# Patient Record
Sex: Female | Born: 1945 | Race: White | Hispanic: No | Smoking: Former smoker
Health system: Southern US, Community
[De-identification: ages and names within clinical notes are randomized; demographics above are authoritative.]

## PROBLEM LIST (undated history)

## (undated) DIAGNOSIS — Z720 Tobacco use: Secondary | ICD-10-CM

## (undated) DIAGNOSIS — R32 Unspecified urinary incontinence: Secondary | ICD-10-CM

## (undated) DIAGNOSIS — E119 Type 2 diabetes mellitus without complications: Secondary | ICD-10-CM

## (undated) DIAGNOSIS — I10 Essential (primary) hypertension: Secondary | ICD-10-CM

## (undated) DIAGNOSIS — M1990 Unspecified osteoarthritis, unspecified site: Secondary | ICD-10-CM

## (undated) DIAGNOSIS — Z9989 Dependence on other enabling machines and devices: Secondary | ICD-10-CM

## (undated) DIAGNOSIS — Z9289 Personal history of other medical treatment: Secondary | ICD-10-CM

## (undated) DIAGNOSIS — K219 Gastro-esophageal reflux disease without esophagitis: Secondary | ICD-10-CM

## (undated) DIAGNOSIS — D649 Anemia, unspecified: Secondary | ICD-10-CM

## (undated) DIAGNOSIS — J309 Allergic rhinitis, unspecified: Secondary | ICD-10-CM

## (undated) DIAGNOSIS — G4733 Obstructive sleep apnea (adult) (pediatric): Secondary | ICD-10-CM

## (undated) DIAGNOSIS — R0989 Other specified symptoms and signs involving the circulatory and respiratory systems: Secondary | ICD-10-CM

## (undated) DIAGNOSIS — F32A Depression, unspecified: Secondary | ICD-10-CM

## (undated) DIAGNOSIS — I4891 Unspecified atrial fibrillation: Secondary | ICD-10-CM

## (undated) DIAGNOSIS — J449 Chronic obstructive pulmonary disease, unspecified: Secondary | ICD-10-CM

## (undated) DIAGNOSIS — D509 Iron deficiency anemia, unspecified: Secondary | ICD-10-CM

## (undated) DIAGNOSIS — R0600 Dyspnea, unspecified: Secondary | ICD-10-CM

## (undated) DIAGNOSIS — C801 Malignant (primary) neoplasm, unspecified: Secondary | ICD-10-CM

## (undated) DIAGNOSIS — E785 Hyperlipidemia, unspecified: Secondary | ICD-10-CM

## (undated) DIAGNOSIS — E7800 Pure hypercholesterolemia, unspecified: Secondary | ICD-10-CM

## (undated) DIAGNOSIS — J349 Unspecified disorder of nose and nasal sinuses: Secondary | ICD-10-CM

## (undated) DIAGNOSIS — L409 Psoriasis, unspecified: Secondary | ICD-10-CM

## (undated) DIAGNOSIS — R0789 Other chest pain: Secondary | ICD-10-CM

## (undated) DIAGNOSIS — R0602 Shortness of breath: Secondary | ICD-10-CM

## (undated) DIAGNOSIS — I2510 Atherosclerotic heart disease of native coronary artery without angina pectoris: Secondary | ICD-10-CM

## (undated) HISTORY — DX: Tobacco use: Z72.0

## (undated) HISTORY — PX: OTHER SURGICAL HISTORY: SHX169

## (undated) HISTORY — DX: Gastro-esophageal reflux disease without esophagitis: K21.9

## (undated) HISTORY — DX: Unspecified disorder of nose and nasal sinuses: J34.9

## (undated) HISTORY — PX: ATRIAL FLUTTER ABLATION: SHX5733

## (undated) HISTORY — DX: Pure hypercholesterolemia, unspecified: E78.00

## (undated) HISTORY — DX: Morbid (severe) obesity due to excess calories: E66.01

## (undated) HISTORY — PX: LARYNX SURGERY: SHX692

## (undated) HISTORY — DX: Unspecified osteoarthritis, unspecified site: M19.90

## (undated) HISTORY — DX: Type 2 diabetes mellitus without complications: E11.9

## (undated) HISTORY — DX: Essential (primary) hypertension: I10

## (undated) HISTORY — DX: Psoriasis, unspecified: L40.9

## (undated) HISTORY — DX: Other chest pain: R07.89

## (undated) HISTORY — DX: Obstructive sleep apnea (adult) (pediatric): G47.33

## (undated) HISTORY — DX: Iron deficiency anemia, unspecified: D50.9

## (undated) HISTORY — DX: Hyperlipidemia, unspecified: E78.5

## (undated) HISTORY — DX: Shortness of breath: R06.02

## (undated) HISTORY — DX: Dependence on other enabling machines and devices: Z99.89

## (undated) HISTORY — DX: Unspecified atrial fibrillation: I48.91

## (undated) HISTORY — DX: Other specified symptoms and signs involving the circulatory and respiratory systems: R09.89

## (undated) HISTORY — DX: Allergic rhinitis, unspecified: J30.9

## (undated) HISTORY — DX: Unspecified urinary incontinence: R32

## (undated) HISTORY — DX: Atherosclerotic heart disease of native coronary artery without angina pectoris: I25.10

---

## 1998-10-22 ENCOUNTER — Ambulatory Visit: Admission: RE | Admit: 1998-10-22 | Discharge: 1998-10-22 | Payer: Self-pay | Admitting: Family Medicine

## 1998-12-27 ENCOUNTER — Encounter: Payer: Self-pay | Admitting: Family Medicine

## 1998-12-27 ENCOUNTER — Ambulatory Visit (HOSPITAL_COMMUNITY): Admission: RE | Admit: 1998-12-27 | Discharge: 1998-12-27 | Payer: Self-pay | Admitting: Family Medicine

## 1999-11-17 DIAGNOSIS — R809 Proteinuria, unspecified: Secondary | ICD-10-CM

## 1999-11-17 HISTORY — DX: Proteinuria, unspecified: R80.9

## 2000-02-18 ENCOUNTER — Other Ambulatory Visit: Admission: RE | Admit: 2000-02-18 | Discharge: 2000-02-18 | Payer: Self-pay | Admitting: Obstetrics and Gynecology

## 2000-03-03 ENCOUNTER — Ambulatory Visit (HOSPITAL_COMMUNITY): Admission: RE | Admit: 2000-03-03 | Discharge: 2000-03-03 | Payer: Self-pay | Admitting: Obstetrics and Gynecology

## 2000-03-03 ENCOUNTER — Encounter: Payer: Self-pay | Admitting: Obstetrics and Gynecology

## 2000-07-16 ENCOUNTER — Ambulatory Visit (HOSPITAL_BASED_OUTPATIENT_CLINIC_OR_DEPARTMENT_OTHER): Admission: RE | Admit: 2000-07-16 | Discharge: 2000-07-16 | Payer: Self-pay | Admitting: *Deleted

## 2001-08-30 ENCOUNTER — Ambulatory Visit (HOSPITAL_COMMUNITY): Admission: RE | Admit: 2001-08-30 | Discharge: 2001-08-30 | Payer: Self-pay | Admitting: Obstetrics and Gynecology

## 2001-08-30 ENCOUNTER — Encounter: Payer: Self-pay | Admitting: Obstetrics and Gynecology

## 2001-09-13 ENCOUNTER — Other Ambulatory Visit: Admission: RE | Admit: 2001-09-13 | Discharge: 2001-09-13 | Payer: Self-pay | Admitting: Obstetrics and Gynecology

## 2002-02-22 ENCOUNTER — Encounter: Admission: RE | Admit: 2002-02-22 | Discharge: 2002-02-22 | Payer: Self-pay | Admitting: Internal Medicine

## 2002-02-22 ENCOUNTER — Encounter (HOSPITAL_BASED_OUTPATIENT_CLINIC_OR_DEPARTMENT_OTHER): Payer: Self-pay | Admitting: Internal Medicine

## 2002-09-04 ENCOUNTER — Ambulatory Visit (HOSPITAL_COMMUNITY): Admission: RE | Admit: 2002-09-04 | Discharge: 2002-09-04 | Payer: Self-pay | Admitting: Gastroenterology

## 2003-08-06 ENCOUNTER — Other Ambulatory Visit: Admission: RE | Admit: 2003-08-06 | Discharge: 2003-08-06 | Payer: Self-pay | Admitting: Obstetrics and Gynecology

## 2003-08-07 ENCOUNTER — Encounter: Payer: Self-pay | Admitting: Obstetrics and Gynecology

## 2003-08-07 ENCOUNTER — Ambulatory Visit (HOSPITAL_COMMUNITY): Admission: RE | Admit: 2003-08-07 | Discharge: 2003-08-07 | Payer: Self-pay | Admitting: Obstetrics and Gynecology

## 2003-11-17 HISTORY — PX: TOTAL KNEE ARTHROPLASTY: SHX125

## 2003-11-20 ENCOUNTER — Inpatient Hospital Stay (HOSPITAL_COMMUNITY): Admission: RE | Admit: 2003-11-20 | Discharge: 2003-11-23 | Payer: Self-pay | Admitting: Orthopedic Surgery

## 2003-11-23 ENCOUNTER — Inpatient Hospital Stay (HOSPITAL_COMMUNITY)
Admission: RE | Admit: 2003-11-23 | Discharge: 2003-11-29 | Payer: Self-pay | Admitting: Physical Medicine & Rehabilitation

## 2004-10-31 ENCOUNTER — Other Ambulatory Visit: Admission: RE | Admit: 2004-10-31 | Discharge: 2004-10-31 | Payer: Self-pay | Admitting: Obstetrics and Gynecology

## 2005-07-22 ENCOUNTER — Ambulatory Visit (HOSPITAL_COMMUNITY): Admission: RE | Admit: 2005-07-22 | Discharge: 2005-07-22 | Payer: Self-pay | Admitting: Obstetrics and Gynecology

## 2005-11-16 HISTORY — PX: TOTAL KNEE ARTHROPLASTY: SHX125

## 2005-11-16 HISTORY — PX: OTHER SURGICAL HISTORY: SHX169

## 2006-09-09 ENCOUNTER — Other Ambulatory Visit: Admission: RE | Admit: 2006-09-09 | Discharge: 2006-09-09 | Payer: Self-pay | Admitting: Obstetrics & Gynecology

## 2006-09-09 ENCOUNTER — Ambulatory Visit (HOSPITAL_COMMUNITY): Admission: RE | Admit: 2006-09-09 | Discharge: 2006-09-09 | Payer: Self-pay | Admitting: Obstetrics and Gynecology

## 2007-02-21 ENCOUNTER — Inpatient Hospital Stay (HOSPITAL_COMMUNITY): Admission: RE | Admit: 2007-02-21 | Discharge: 2007-02-25 | Payer: Self-pay | Admitting: Orthopedic Surgery

## 2007-09-22 ENCOUNTER — Ambulatory Visit (HOSPITAL_COMMUNITY): Admission: RE | Admit: 2007-09-22 | Discharge: 2007-09-22 | Payer: Self-pay | Admitting: Obstetrics & Gynecology

## 2008-01-26 ENCOUNTER — Other Ambulatory Visit: Admission: RE | Admit: 2008-01-26 | Discharge: 2008-01-26 | Payer: Self-pay | Admitting: Obstetrics & Gynecology

## 2008-09-27 ENCOUNTER — Ambulatory Visit (HOSPITAL_COMMUNITY): Admission: RE | Admit: 2008-09-27 | Discharge: 2008-09-27 | Payer: Self-pay | Admitting: Obstetrics & Gynecology

## 2009-03-05 ENCOUNTER — Other Ambulatory Visit: Admission: RE | Admit: 2009-03-05 | Discharge: 2009-03-05 | Payer: Self-pay | Admitting: Obstetrics & Gynecology

## 2009-11-16 HISTORY — PX: OTHER SURGICAL HISTORY: SHX169

## 2011-02-26 ENCOUNTER — Other Ambulatory Visit (HOSPITAL_COMMUNITY): Payer: Self-pay | Admitting: Surgery

## 2011-02-26 DIAGNOSIS — Z01818 Encounter for other preprocedural examination: Secondary | ICD-10-CM

## 2011-03-02 ENCOUNTER — Inpatient Hospital Stay (HOSPITAL_COMMUNITY): Admission: RE | Admit: 2011-03-02 | Payer: Self-pay | Source: Ambulatory Visit

## 2011-03-04 ENCOUNTER — Encounter: Payer: 59 | Attending: Surgery | Admitting: *Deleted

## 2011-03-04 DIAGNOSIS — Z713 Dietary counseling and surveillance: Secondary | ICD-10-CM | POA: Insufficient documentation

## 2011-03-04 DIAGNOSIS — Z01818 Encounter for other preprocedural examination: Secondary | ICD-10-CM | POA: Insufficient documentation

## 2011-03-06 ENCOUNTER — Other Ambulatory Visit (HOSPITAL_COMMUNITY): Payer: Self-pay | Admitting: Surgery

## 2011-03-06 ENCOUNTER — Ambulatory Visit (HOSPITAL_COMMUNITY)
Admission: RE | Admit: 2011-03-06 | Discharge: 2011-03-06 | Disposition: A | Discharge status: Home / Self Care | Payer: 59 | Source: Ambulatory Visit | Attending: Surgery | Admitting: Surgery

## 2011-03-06 DIAGNOSIS — Z01810 Encounter for preprocedural cardiovascular examination: Secondary | ICD-10-CM | POA: Insufficient documentation

## 2011-03-06 DIAGNOSIS — K449 Diaphragmatic hernia without obstruction or gangrene: Secondary | ICD-10-CM | POA: Insufficient documentation

## 2011-03-06 DIAGNOSIS — K219 Gastro-esophageal reflux disease without esophagitis: Secondary | ICD-10-CM | POA: Insufficient documentation

## 2011-03-06 DIAGNOSIS — Z01812 Encounter for preprocedural laboratory examination: Secondary | ICD-10-CM | POA: Insufficient documentation

## 2011-03-06 DIAGNOSIS — Z01818 Encounter for other preprocedural examination: Secondary | ICD-10-CM

## 2011-03-09 ENCOUNTER — Ambulatory Visit (HOSPITAL_COMMUNITY)
Admission: RE | Admit: 2011-03-09 | Discharge: 2011-03-09 | Disposition: A | Discharge status: Home / Self Care | Payer: 59 | Source: Ambulatory Visit | Attending: Surgery | Admitting: Surgery

## 2011-03-09 DIAGNOSIS — Z1231 Encounter for screening mammogram for malignant neoplasm of breast: Secondary | ICD-10-CM

## 2011-03-10 ENCOUNTER — Ambulatory Visit (HOSPITAL_COMMUNITY)
Admission: RE | Admit: 2011-03-10 | Discharge: 2011-03-10 | Disposition: A | Discharge status: Home / Self Care | Payer: 59 | Source: Ambulatory Visit | Attending: Surgery | Admitting: Surgery

## 2011-03-10 DIAGNOSIS — I517 Cardiomegaly: Secondary | ICD-10-CM | POA: Insufficient documentation

## 2011-03-10 DIAGNOSIS — Z01818 Encounter for other preprocedural examination: Secondary | ICD-10-CM | POA: Insufficient documentation

## 2011-03-10 DIAGNOSIS — K8020 Calculus of gallbladder without cholecystitis without obstruction: Secondary | ICD-10-CM | POA: Insufficient documentation

## 2011-03-10 DIAGNOSIS — I10 Essential (primary) hypertension: Secondary | ICD-10-CM | POA: Insufficient documentation

## 2011-03-10 DIAGNOSIS — E119 Type 2 diabetes mellitus without complications: Secondary | ICD-10-CM | POA: Insufficient documentation

## 2011-03-10 DIAGNOSIS — R0602 Shortness of breath: Secondary | ICD-10-CM | POA: Insufficient documentation

## 2011-03-11 ENCOUNTER — Ambulatory Visit (HOSPITAL_COMMUNITY)
Admit: 2011-03-11 | Discharge: 2011-03-11 | Disposition: A | Discharge status: Home / Self Care | Payer: 59 | Attending: Surgery | Admitting: Surgery

## 2011-03-11 DIAGNOSIS — Z1231 Encounter for screening mammogram for malignant neoplasm of breast: Secondary | ICD-10-CM | POA: Insufficient documentation

## 2011-04-03 NOTE — Discharge Summary (Signed)
Veronica Hansen, Veronica Hansen               ACCOUNT NO.:  [id number]   MEDICAL RECORD NO.:  [id number]          PATIENT TYPE:  INP   LOCATION:  1509                         FACILITY:  [facility]   PHYSICIAN:  Madlyn Frankel. Charlann Boxer, M.D.  DATE OF BIRTH:  October 18, 1946   DATE OF ADMISSION:  02/21/2007  DATE OF DISCHARGE:  02/25/2007                               DISCHARGE SUMMARY   ADMITTING DIAGNOSIS:  1. Osteoarthritis.  2. Hypertension.  3. Depression.  4. Diabetes, type 2.  5. Left total knee replacement 2005.   DISCHARGE DIAGNOSIS:  1. Osteoarthritis.  2. Hypertension.  3. Depression.  4. Diabetes, type 2.  5. Left total knee replacement.  6. Postoperative blood loss anemia, resolved without need for further      treatment.   CONSULTATION:  None.   PROCEDURE:  Right total knee replacement.  Surgeon was Dr. Durene Romans.  Assistant was [employer], PA-C.   LABORATORY:  Preadmission:  White blood cells 10.6, hematocrit 42.8.  Post-op day #1, white blood cells 11.3, hematocrit 32.6.  Post-op day  #2, white blood cells 11.5, hematocrit 30.6 representing a postoperative  blood loss anemia.  However, does not require further treatment and will  resolve on its own.  Preadmission coagulation INR was 0.9.  Preadmission  chemistry:  Sodium 142, potassium 4.2, glucose 152.  Post-op day #1,  glucose 177, all others normal.  On post-op day #2, glucose 126.  Routine chemistries:  GFR remained greater than 60 throughout her course  of stay.  Preadmission GI workup all within normal limits.  Preadmission  UA:  Leukocyte esterase trace, many epithelial cells, a few bacteria.   RADIOLOGY:  Chest, two-view, January 20, 2007, no active disease.   EKG showed a normal sinus rhythm, nonspecific T-wave abnormality.   HOSPITAL COURSE:  The patient underwent right total knee replacement,  tolerated procedure well, was admitted to the orthopedic floor.  Pain  was well-controlled throughout.  Post-op day #1, she was  resting  comfortably.  Her dressing was checked.  She was clean, dry, and intact.  She was neuromuscular and vascularly intact.  PT/OT was begun with  weightbearing as tolerated with the use of a rolling walker.  Our plan  was for home health care PT on Thursday.  PT evaluation was done on  first day recommended home health care PT.  Post-op  day #2, a CPAP  machine was outfitted for the patient.  When we saw her on post-op day  #2, she was doing well, no complaints, dressing was changed.  There was  no active wound drainage at the time.  She was neuromuscular and  vascularly intact.  She could do a straight-leg raise with significant  effort.  Knee immobilizer was not needed.  The spinal was discontinue.  We also discontinue her PCA pump, as well as her Hep-Lock IV.  Post-op  day #3, the patient was doing well when I saw her.  She was ready to go  home.  Her dressing was changed. She did have some mild serous ooze from  the mid incision, continued be neuromuscular  vascularly intact, with a  straight-leg raise, and weightbearing as tolerated with the use of a  rolling walker.  We did hold Lovenox and scheduled for discharge at the  time.  She later saw Dr. Charlann Boxer due to some mild concerns about the wound.  We held back from discharging her on post-op day #3, and we are going to  hold the Lovenox and check the wound on the next day.  We saw her on  post-op day #4, she was doing well.  She has continued be afebrile.  There were no signs of dehiscence, and still some very mild bloody ooze.  We reapplied some Steri-Strips as well as a compressive wrap.  We  decreased the amount of range of motion that she underwent at physical  therapy.  We will continue to hold Lovenox.  On recheck later that day,  there was minimal drainage and the patient was comfortable with going  home at that point in time.  We went ahead and held the Lovenox  throughout and we will just begin her on the enteric-coated  aspirin at  325 mg for the next 6 weeks for blood clot prevention.   DISCHARGE DISPOSITION:  Stable, improved condition.  Discharged home  with home health care physical therapy.   DISCHARGE PHYSICAL THERAPY GOALS:  Physical therapy will work on gait  training, proprioception, minimize pain, maximize range of motion,  increase strength, and encourage independence of activities daily  living.   DISCHARGE WOUND CARE:  Keep wound dry and change dressing on a daily  basis.   DISCHARGE DIET:  Diabetic.   DISCHARGE SPECIAL INSTRUCTIONS:  1. Follow up with Dr. Charlann Boxer, [phone],  in two weeks for a wound check.  2. Ice liberally.  3. TED hose on 12, off 12.  4. If persistent drainage for more than three days, call our office.   DISCHARGE MEDICATIONS:  1. Vicodin 5/325, one to two p.o. every four to six hours p.r.n. pain.  2. Robaxin 500 mg, one p.o. q.6 h. p.r.n. muscle spasm/pain.  3. Iron sulfate 325 mg, one p.o. t.i.d. x2 weeks.  4. Colace 100 mg, one p.o. b.i.d. constipation.  5. MiraLax 17 grams, one p.o. daily.  6. Enteric-coated aspirin 325 mg, one p.o. daily x6 blood clot      prevention.  7. Byetta 10 mg one p.o. b.i.d.  8. Lisinopril/HCTZ 10/12.5, one p.o. q.a.m.  9. Fluoxetine 20 mg, one p.o. q.a.m.  10.Vytorin 10/80, one p.o. daily.     ______________________________  Yetta Glassman [address], [place]      Madlyn Frankel. Charlann Boxer, M.D.  Electronically Signed    BLM/MEDQ  D:  03/17/2007  T:  03/17/2007  Job:  [id number]

## 2011-04-03 NOTE — Op Note (Signed)
NAMESARYAH, LOPER               ACCOUNT NO.:  [id number]   MEDICAL RECORD NO.:  [id number]          PATIENT TYPE:  INP   LOCATION:  X001                         FACILITY:  [facility]   PHYSICIAN:  Madlyn Frankel. Charlann Boxer, M.D.  DATE OF BIRTH:  10-03-1946   DATE OF PROCEDURE:  02/21/2007  DATE OF DISCHARGE:                               OPERATIVE REPORT   PREOPERATIVE DIAGNOSIS:  Right knee end-stage osteoarthritis.  Status  post history of left total knee replacement.   POSTOPERATIVE DIAGNOSIS:  Right knee end-stage osteoarthritis.  Status  post history of left total knee replacement.   PROCEDURE:  Right total knee replacement.   COMPONENTS USED:  DePuy rotating platform posterior stabilized knee  system with size 4 femur, 4 tibia, 15-mm insert and 41 patella.   SURGEON:  Madlyn Frankel. Charlann Boxer, M.D.   ASSISTANT:  Yetta Glassman. Mann, PA   ANESTHESIA:  General plus a preoperative femoral nerve block regional.   TOURNIQUET TIME:  55 minutes at 300 mmHg.   COMPLICATIONS:  None.   DRAINS:  None.   INDICATIONS FOR PROCEDURE:  Ms. Egler is a now 65 year old female who  had initially presented to my office with end-stage radiographic changes  and degenerative changes.  The left side was more symptomatic than the  right.  She is now status post left total knee replacement approximately  2 years and has done very well with this.  The right knee had  progressively worsened symptomatically to the point where she was unable  to do any exercise important for cardiac, diabetic and overall health  due to some obesity.  Quality of life was diminishing.  At this point,  she decided she wished to proceed with the right knee replacement.  We  reviewed the risks and benefits of infection, DVT, component failure,  need for revision, and the impacted of her size on the longevity of the  prosthesis.  Consent was obtained for the knee replacement.   PROCEDURE IN DETAIL:  The patient was brought to the  operative theater.  Once adequate anesthesia and preoperative antibiotics, 2 grams of Ancef,  were administered, the patient was positioned supine with a proximal  thigh tourniquet placed.  The right lower extremity was then pre-  scrubbed, prepped and draped in sterile fashion.  A midline incision was  made followed by a median parapatellar arthrotomy with patella  subluxation rather than eversion.  Following knee exposure and a  synovectomy, attention was first directed to the patella.  The  prepatellar measurement was approximately 23 mm.  I resected down to 14  mm and used a 41 patellar button.   I did debride synovium around the patella to prevent any further  impingement and scarring.  Following this, a metal shim was placed in  the drill holes to protect the patella against retractors.  Knee  exposure was obtained.  Femoral canal was opened with a drill and  irrigated to prevent fat emboli.  Intramedullary rod was then passed at  5 degrees of valgus for the right knee.  Two mm of bone was resected off  the distal femur.  There was significant loss of femoral cartilage and  osteophytes that were removed.  Following the distal femoral cut, I  sized the femur to be a size 4 which was similar to that of the  contralateral knee.   The anterior, posterior, and chamfer cuts were then made based off the  posterior condylar axis at 3 degrees of external rotation which matched  the perpendicular to [employer].  The trochlear box cuts then made  based off the lateral aspect of distal femur.  Following the femoral  preparation and  debridement of osteophytes along the medial aspect of  the femur that would help decompress the MCL, attention was now directed  to the tibia.  With tibial subluxation and exposure, further  meniscectomies and debridement of the PCL stump, the extramedullary jig  was placed.  I resected 10 mm of bone off the lateral proximal tibia.  This amounted to about a  4-mm cut off the proximal tibia.  This was done  on the contralateral knee.  This cut was made and excessive osteophytes  debrided.  The cut surface matched nicely with the size 4 tibia based  off the lateral aspect the proximal tibia, allowing me to further  decompress the MCL and debride osteophytes medially.  The tibial tray  was prepared with drilling and keel punching.  Alignment rod was passed  indicating that the cut was perpendicular in both planes.  Trial  reduction was then carried out with a 4 femur and 4 tibia.  Initially, I  used a 12.5 insert but felt that the 15 was a little more stable in  extension and very stable all the way through flexion.  Given this, all  trial components were removed.  The patella was noted to track without  any application of pressure.  The knee was copiously irrigated with  normal saline solution with pulse lavage.  I injected the knee at this  point with 0.25% Marcaine with epinephrine and 1 mL of Toradol for a  total of 61 mL.  Once the final components were open, cement was mixed.  The components were cemented in position with the tibia first and then  the femur and patella.  The knee was brought out to extension with a 15-  mm insert.  Once the cement had cured and excessive cement debrided and  I was satisfied there was no excessive cement or any residual cement  posteriorly, the final 15-mm insert was placed.  The knee was reduced  and irrigated again.  At this point, the tourniquet was let down at 55  minutes.  I used 10 mL of FloSeal which was injected into the gutters  and posteriorly.   No drain was used.  The knee was brought up to flexion and #1 Vicryl was  used to reapproximate the extensor mechanism followed by 2-0 Vicryl in  the subcutaneous layer and a running 4-0 Monocryl.  The knee was then  cleaned, dried and dressed sterilely with Steri-Strips, Methaplex dressing and a bulky dry dressing over the top.  The patient was then   extubated and brought to the recovery room in stable condition.      Madlyn Frankel Charlann Boxer, M.D.  Electronically Signed     MDO/MEDQ  D:  02/21/2007  T:  02/21/2007  Job:  [id number]

## 2011-04-03 NOTE — H&P (Signed)
Veronica Hansen, Veronica Hansen               ACCOUNT NO.:  [id number]   MEDICAL RECORD NO.:  [id number]           PATIENT TYPE:   LOCATION:                                FACILITY:  [facility]   PHYSICIAN:  Madlyn Frankel. Charlann Boxer, M.D.  DATE OF BIRTH:  Aug 02, 1946   DATE OF ADMISSION:  02/21/2007  DATE OF DISCHARGE:                              HISTORY & PHYSICAL   PREADMISSION HISTORY AND PHYSICAL   Procedure will be a right total knee arthroplasty.   CHIEF COMPLAINT:  Right knee pain.   HISTORY OF PRESENT ILLNESS:  This is a 65 year old female with a history  of persistent progressive knee pain secondary to osteoarthritis.  She  had been refractory to all conservative treatments.  She has been pre-  surgically cleared for right total knee replacement by Dr. Nyra Capes.   PAST MEDICAL HISTORY:  Includes hypertension, depression, diabetes type  2.   PAST SURGICAL HISTORY:  Include left total knee replacement 2005 and  throat polyps removed in 2001.   FAMILY HISTORY:  Heart disease, diabetes, arthritis.   SOCIAL HISTORY:  She is married.  Primary caregiver will be spouse at  home.   DRUG ALLERGIES:  NO KNOWN DRUG ALLERGIES.   MEDICATIONS:  Include:  1. Lisinopril/hydrochlorothiazide 10/25 one p.o. daily  2. Fluoxetine 20 mg one p.o. daily.  3. Vytorin 80 mg one p.o. daily.  4. Byetta 10 mg one p.o. b.i.d.  5. Aspirin 81 mg one p.o. daily.   REVIEW OF SYSTEMS:  None other than HPI.   PHYSICAL EXAMINATION:  Pulse 72, respirations 18, blood pressure 124/92.  GENERAL:  Awake, alert and oriented, well-developed, well-nourished, in  no acute distress.  NECK:  Supple.  No carotid bruits.  CHEST/LUNGS:  Clear to auscultation bilaterally.  BREASTS:  Deferred.  HEART: Regular rate and rhythm without gallops, clicks, rubs or murmurs.  ABDOMEN:  Soft, nontender, nondistended.  Bowel sounds present in all  four quadrants.  GENITOURINARY:  Deferred.  EXTREMITIES:  Painful medially with varus  deformity, patellofemoral  pain.  SKIN:  Dorsalis pedis pulse positive.  No cellulitis.  NEUROLOGIC:  She has intact distal sensibilities.   Labs are pending.  EKG pending.  Chest X-Ray: Pending.   IMPRESSION:  Right knee osteoarthritis.   PLAN OF ACTION:  Right total knee arthroplasty on February 21, 2007, at  [facility].  Surgeon Dr. Durene Romans.  Risks and  complications were discussed.  Questions were encouraged, answered and  reviewed.     ______________________________  Veronica Hansen, [place]      Madlyn Frankel. Charlann Boxer, M.D.  Electronically Signed    BLM/MEDQ  D:  01/27/2007  T:  01/29/2007  Job:  [id number]

## 2011-04-03 NOTE — H&P (Signed)
NAME:  Veronica Hansen, Veronica Hansen                         ACCOUNT NO.:  [id number]   MEDICAL RECORD NO.:  [id number]                   PATIENT TYPE:  INP   LOCATION:  NA                                   [facility]ACILITY:  [facility]   PHYSICIAN:  Madlyn Frankel. Charlann Boxer, M.D.               DATE OF BIRTH:  01-07-1946   DATE OF ADMISSION:  11/20/2003  DATE OF DISCHARGE:                                HISTORY & PHYSICAL   CHIEF COMPLAINT:  Left knee pain.   HISTORY OF PRESENT ILLNESS:  The patient is a 65 year old female who had  seen Dr. Charlann Boxer for evaluation of her left knee.  The patient states that she  has had difficulty with her knees for years, and has had a difficult time  trying to lose weight.  She has a lot of pain on the medial aspect of her  knee.  She has failed all conservative management up to this point.  She  expresses some knowledge of joint replacement surgery as well as the risks  involved being overweight.  She does states that she has the inability to  stand from more than 15 minutes at a time without knee swelling.  She has a  lot of grinding, and can definitely feel bone-on-bone sensation.  Dr. Charlann Boxer  at this point feels that it is best to proceed with a total knee  replacement.  The patient agrees.  Risks and benefits of the surgery have  been discussed with the patient and the patient wishes to proceed.   PAST MEDICAL HISTORY:  1. Hypertension.  2. Hypercholesterolemia.  3. Anxiety.   PAST SURGICAL HISTORY:  1. Throat surgery.  2. Left knee scope.   MEDICATIONS:  1. Lipitor 20 mg one p.o. q.d.  2. Wellbutrin XL 300 mg one p.o. q.d.  3. Flonase 50 mcg p.r.n.  4. Celebrex 200 mg one p.o. q.d.  5. Lisinopril/HCTZ, doses and schedule not known at this time.  She has been     instructed to take it with her to her pre-admission testing.   ALLERGIES:  No known drug allergies.   SOCIAL HISTORY:  The patient denies tobacco currently;  she quite in  February 2004.  She has one glass of wine  per week.  She is married.  She  lives in a two story house with four steps entering the house.  She will be  able to stay on the main floor of the house without having to go upstairs.   FAMILY HISTORY:  Father:  Diabetes mellitus.  Brothers:  Diabetes mellitus.   REVIEW OF SYSTEMS:  GENERAL:  Denies fevers, chills, night sweats, bleeding  tendencies.  CNS:  Denies blurry or double vision, seizures, headaches,  paralysis.  RESPIRATORY:  Positive shortness of breath with exertion.  Denies productive cough, hemoptysis.  CARDIOVASCULAR:  Denies chest pain,  angina, orthopnea.  GASTROINTESTINAL:  Denies nausea, vomiting, diarrhea,  constipation,  melena, bloody stools.  GENITOURINARY:  No dysuria, hematuria,  or discharge.  MUSCULOSKELETAL:  Numbness of the right upper extremity.   PHYSICAL EXAMINATION:  VITAL SIGNS:  Blood pressure 120/80, pulse 100,  respirations 16.  GENERAL:  A well-developed, well-nourished, obese 65 year old female.  HEENT:  Normocephalic, atraumatic.  Pupils equal, round, reactive to light.  NECK:  Supple, no carotid bruit noted.  CHEST:  Clear to auscultation bilaterally.  No rubs or crackles.  HEART:  Regular rate and rhythm, no murmurs, rubs, or gallops.  ABDOMEN:  Soft, nontender, nondistended, positive bowel sounds x4.  EXTREMITIES:  The patient reveals significant genu varum deformity  bilaterally.  She walks with an antalgic gait.  She has full extension, may  be lacking a couple of degrees.  She has flexion to beyond 110 degrees.  She  has pseudolaxity medially, but intact ligament.  Tenderness on the medial  side of her knee, but not specifically over the joint line.  She has pain  and grinding of her patella.  SKIN:  No rashes or lesions.   LABORATORY DATA:  X-ray reveals end-stage bone-on-bone arthritis in the left  knee with significant osteophyte formation in all three compartments.   IMPRESSION:  1. Osteoarthritis of the left knee.  2.  Hypertension.  3. Hypercholesterolemia.  4. Anxiety.   PLAN:  The patient will be admitted to [facility] on November 20, 2003, to undergo a left total knee arthroplasty by Dr. Durene Romans.     Clarene Reamer, P.A.-C.                   Madlyn Frankel Charlann Boxer, M.D.    SW/MEDQ  D:  11/07/2003  T:  11/07/2003  Job:  [id number]   cc:   Barry Dienes. Eloise Harman, M.D.  [address]  [place]  [place] [address]  Fax: [phone]

## 2011-04-03 NOTE — Discharge Summary (Signed)
NAME:  Veronica Hansen, Veronica Hansen                         ACCOUNT NO.:  [id number]   MEDICAL RECORD NO.:  [id number]                   PATIENT TYPE:  IPS   LOCATION:  4146                                 FACILITY:  [facility]   PHYSICIAN:  Erick Colace, M.D.           DATE OF BIRTH:  [birth year]   DATE OF ADMISSION:  11/23/2003  DATE OF DISCHARGE:  11/29/2003                                 DISCHARGE SUMMARY   DISCHARGE DIAGNOSES:  1. Left total knee replacement.  2. Obesity.  3. Hypertension.  4. Sleep apnea.   HISTORY OF PRESENT ILLNESS:  The patient is a 65 year old female with  history of hypertension, chronic left knee pain secondary to OA. She has  failed conservative therapy and elected to undergo left total knee  replacement by Sharolyn Douglas, M.D.  Postoperative is weightbearing as tolerated  and Coumadin for DVT prophylaxis.  INR of 1.6 today.  Therapy is initiated  and the patient had supervision for upper body care, mod assist with low  body care, min to mod assist for transfers, contact guard assist to ambulate  40 feet with a rolling walker.   PAST MEDICAL HISTORY:  Significant for hypertension, obesity, OA bilateral  knee and left ankle, excision of nodule,  rash of left elbow, sleep apnea,  anxiety, and depression.   ALLERGIES:  No known drug allergies.   SOCIAL HISTORY:  The patient is married and lives in one-level home with two  steps at entry.  She was independent prior to admission.  She quit tobacco  years ago and uses one glass of wine a week.   HOSPITAL COURSE:  The patient was admitted to rehab on November 26, 2003, for  inpatient therapy to consist of PT and OT daily.  Past admission, she was  continued on Coumadin for DVT prophylaxis, Lovenox started initially for  bridge until INR therapeutic.  Blood pressures were monitored on b.i.d.  basis and showed good control.  A multivitamin was added for mild  postoperative anemia.  Labs done past admission showed  hemoglobin of 10.5,  hematocrit 30.7, white count 10.6, platelets 383, sodium 136, potassium 3.7,  chloride 101, CO2 29, BUN 6, creatinine 0.7, glucose _______.  LFTs within  normal limits except for low albumin at 2.8.  The patient's knee incision  has healed well without any signs or symptoms of infection, no drainage, no  erythema noted.  The patient has been continued on Coumadin throughout the  stay and at the time of discharge, Coumadin is trending toward therapeutic  at 1.9.  The patient is discharged on 7.5 mg of Coumadin a day with next pro-  time on January 17, Coumadin to continue through December 21, 2003.  He was  continued rehab and the patient made good progress.  Currently she is  modified independent for upper and lower body care including toileting and  advanced ADL.  She is modified independent for  transfers, modified  independent for ambulating 30+ feet with a rolling walker.  Knee flexion is  93 degrees.  Further follow-up therapies to include home health PT advanced  by [facility] with home health RN arranged for pro-time.  On November 29, 2003, the patient is discharged to home.   DISCHARGE MEDICATIONS:  1. Coumadin 5 mg 1-1/2 p.o. q.p.m.  2. Bupropion 300 mg per day.  3. __________ Hydrochlorothiazide 10-12.5 mg per day.  4. Lipitor 20 mg q.h.s.  5. Robaxin 500 mg p.o. q.i.d. p.r.n. spasms.  6. Oxycodone IR 5 to 10 mg q.4-6h p.r.n. pain.  7. Tylenol additionally for pain as needed.   ACTIVITY:  Use walker.   DIET:  Low fat.   DISCHARGE INSTRUCTIONS:  No alcohol, no smoking, and no driving.  Keep knee  incision clean and dry.  [facility] PT and RN.   FOLLOW UP:  The patient is to follow up with __________ in one week for  postoperative check and follow up with __________ in two to three weeks for  repeat check and follow up with Erick Colace, M.D. as needed.      Greg Cutter, P.A.                    Erick Colace, M.D.    PP/MEDQ  D:  11/29/2003  T:  11/30/2003  Job:  [id number]

## 2011-04-03 NOTE — Op Note (Signed)
[place]. [facility]  Patient:    Veronica Hansen, Veronica Hansen                      MRN: [id number] Proc. Date: 07/16/00 Adm. 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  OPERATIVE PROCEDURE:  Microdirect laryngoscopy and bilateral vocal cord stripping for biopsy.  DESCRIPTION OF PROCEDURE:   With the patient under general oral tracheal anesthesia the laryngoscope was introduced.  The epiglottis was clear on both lingual and glottic surfaces as were the vallecula.  The piriformis sinuses though with a little edema of the mucosa had normal mucosa.  The interior larynx was then exposed revealing marked edema of the false cords, true cords, and hemorrhagic leukoplakic area of the left vocal cord, superior surface primarily, and a small area of hyperplastic edematous mucosa superior free margin, mid right cord.  The laryngoscope was suspended and with visualization under the operating microscope further examination and then the edematous and leukoplakic mucosa was stripped off of the left vocal cord stripping almost the entire length except for the anterior commissure of the superior free margin surface.  Laminated stripped on the right side of the hyperplastic edematous mucosal changes was completed.  The patient tolerated the procedure satisfactorily and was taken to the recovery room in stable and satisfactory condition. DD:  07/16/00 TD:  07/17/00 Job: [id number]

## 2011-04-03 NOTE — Discharge Summary (Signed)
NAME:  Veronica Hansen, Veronica Hansen                         ACCOUNT NO.:  [id number]   MEDICAL RECORD NO.:  [id number]                   PATIENT TYPE:  INP   LOCATION:  0457                                 FACILITY:  [facility]   PHYSICIAN:  Madlyn Frankel. Charlann Boxer, M.D.               DATE OF BIRTH:  Feb 03, 1946   DATE OF ADMISSION:  DATE OF DISCHARGE:  11/23/2003                                 DISCHARGE SUMMARY   ADMISSION DIAGNOSES:  1. Osteoarthritis of the left knee.  2. Hypertension.  3. Hypercholesterolemia.  4. Anxiety.   DISCHARGE DIAGNOSES:  1. 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  2. Hypertension.  3. Hypercholesterolemia.  4. Anxiety.  5. Postoperative hemorrhagic anemia stable at the time of discharge.   PROCEDURES:  The patient was taken to the operating room on November 20, 2003  and underwent a left total knee arthroplasty.   SURGEON:  Madlyn Frankel. Charlann Boxer, M.D.   ASSISTANT:  Clarene Reamer, P.A.-C.   ANESTHESIA:  General.  Hemovac drain x1 was placed at the time of surgery.   CONSULTS:  1. Physical therapy.  2. Occupational therapy.  3. Physical Medicine and Rehabilitation with Dr. Thomasena Edis.  4. Social work case Insurance account manager.   LABORATORY DATA:  CBC on admission showed a hemoglobin of 13.8, hematocrit  of 39.9, white blood cell count 9.7, red blood cell count 4.6.  Serial H&Hs  were followed throughout the hospital stay.  Hemoglobin and hematocrit did  decline to 11.5 and 33.8 on November 23, 2003.  However, were stable at the  time of discharge.  Liver enzymes were all within normal limits.  Coagulation studies on admission were all within normal limits.  PT-INR at  the time of discharge were 17.0 and 1.6 on Coumadin therapy.  Routine  chemistry on admission on showed glucose high at 142.  Serum chemistries  were followed throughout the hospital stay.  Sunday it did decline to 132  and remained at 132 until discharge.  Potassium declined to 3.2 on  November 23, 2003.  However, was stable at the time of discharge along with sodium.  Glucose ranged from a high of 155 on November 22, 2003 to a low at 42 on  November 23, 2003.  The BUN did fall to 5 on November 22, 2003.  However, it was  back in normal limits to 8 on November 24, 2003.  Urinalysis on admission  showed 30 mg/dc of protein.  Otherwise normal.  The patient's blood type is  O positive with antibody screen negative.  Preoperative chest x-ray revealed  top mild cardiomegaly with mild diffuse interstitial problems, which is  likely chronic.  Postoperative x-rays of the knee revealed satisfactory  appearance following total knee replacement.  EKG on admission showed normal  sinus rhythm, nonspecific ST and T wave abnormalities.   HOSPITAL COURSE:  The patient was admitted to [facility] and taken  to the operating room.  She underwent the above stated procedure without  complications.  The patient tolerated the procedure well upon return to the  recovery room and to the orthopedic floor to continue postoperative care.  On postoperative day #1 the patient was resting comfortably.  T-max was  100.1.  Heart rate was 100.  H& H 11.9 and 34.6.  She was neurovascularly  intact to the left lower extremity.  Hemovac was discontinued on the same.  The patient was worked with physical therapy and occupational therapy.  On  November 22, 2003 on postoperative day #2 the patient was resting comfortably  with no complaints.  T-max was 101.  She was afebrile at the time of being  seen.  Heart rate was 105.  Hemoglobin and hematocrit were 11.6 and 33.9.  She was neurovascularly intact to the left lower extremity.  The incision  was clean, dry, and intact.  The patient was continuing to work with  physical therapy and occupational therapy.  PCA was also discontinued on  this day.  A rehabilitation consultation was pending at that time.  On  November 23, 2003 on postoperative day #3 the patient was  resting comfortably.  Hemoglobin and hematocrit were 11.5 and 33.8.  She was afebrile.  Her vital  signs were stable.  She was neurovascularly intact to the left lower  extremity.  The incision was clean, dry, and intact.  The patient was  continuing to work with physical therapy and occupational therapy with the  hope for discharge to rehabilitation on this date.   DISPOSITION:  The patient was discharged to rehabilitation on November 23, 2003.   DISCHARGE MEDICATIONS:  1. Colace 100 mg 1 p.o. b.i.d.  2. Trinsicon 1 caplet p.o. b.i.d.  3. Laxative of choice 1 unit p.o. p.r.n.  4. Enema of choice 1 unit p.r.n.  5. Reglan 10 mg p.o. q.8h. p.r.n.  6. Phenergan 25 mg. p.o. q.6h. p.r.n.  7. Vicodin 1-2 p.o. q.4-6h. p.r.n. pain.  8. Tylenol 325 mg p.r.n. temperature greater than 101.5 q.4h. p.r.n.  9. Robaxin 500 mg p.o. q.6h. p.r.n.  10.      Restoril  15-30 mg p.o. q.h.s. p.r.n.  11.      Benadryl 25-50 mg p.o. q.h.s. p.r.n.  12.      Coumadin per pharmacy protocol.  13.      Ventolin 2.5 mg inhaled q.i.d. p.r.n.  14.      Zocor 40 mg p.o. 1800 hours.  15.      Wellbutrin 300 mg p.o. daily.  16.      Flonase 1 spray nasally p.r.n.  17.      Lisinopril 10 mg 1 p.o. daily to be given with HCTZ.  18.      Hydrochlorothiazide 12.5 mg p.o. daily to be given with lisinopril.   DIET:  As tolerated.   ACTIVITY:  Total knee precautions.  Continue with her home care after  discharge from rehabilitation.  The patient is weightbearing as tolerated to  the left lower extremity.   WOUND CARE:  The patient is to perform daily dressing changes until no  drainage.  May shower when no drainage.   FOLLOW UP:  The patient is to follow up two weeks from the day of surgery  with Dr. Nilsa Nutting office to be called for an appointment.   CONDITION ON DISCHARGE:  Stable and improved.     Clarene Reamer, P.A.-C.  Madlyn Frankel Charlann Boxer, M.D.   SW/MEDQ  D:  12/10/2003  T:  12/10/2003  Job:   [id number]

## 2011-04-03 NOTE — Op Note (Signed)
   NAME:  Veronica Hansen, Veronica Hansen                         ACCOUNT NO.:  [id number]   MEDICAL RECORD NO.:  [id number]                   PATIENT TYPE:  AMB   LOCATION:  ENDO                                 FACILITY:  [facility]   PHYSICIAN:  John C. Madilyn Fireman, M.D.                 DATE OF BIRTH:  11/26/1945   DATE OF PROCEDURE:  09/04/2002  DATE OF DISCHARGE:                                 OPERATIVE REPORT   PROCEDURE:  Colonoscopy.   INDICATIONS FOR PROCEDURE:  Screening colonoscopy in a 65 year old patient  with no prior colon screening.   DESCRIPTION OF PROCEDURE:  The patient was placed in the left lateral  decubitus position then placed on the pulse monitor with continuous low flow  oxygen delivered by nasal cannula. She was sedated with 75 mg IV Demerol and  7.5 mg IV Versed. The Olympus video colonoscope was inserted into the rectum  and advanced to the cecum, confirmed by transillumination at McBurney's  point and visualization at the ileocecal valve and appendiceal orifice. The  prep was excellent. The cecum, ascending, transverse, descending and sigmoid  colon all appeared normal with no masses, polyps, diverticula or other  mucosal abnormalities. The rectum likewise appeared normal and retroflexed  view of the anus revealed no obvious internal hemorrhoids. The colonoscope  was then withdrawn and the patient returned to the recovery room in stable  condition. The patient tolerated the procedure well and there were no  immediate complications.   IMPRESSION:  Normal colonoscopy.   PLAN:  Will plan next colon screening by flexible sigmoidoscopy in five  years.                                                John C. Madilyn Fireman, M.D.    JCH/MEDQ  D:  09/04/2002  T:  09/04/2002  Job:  [id number]   cc:   Barry Dienes. Eloise Harman, M.D.

## 2011-04-03 NOTE — Op Note (Signed)
NAME:  Veronica Hansen, Veronica Hansen                         ACCOUNT NO.:  [id number]   MEDICAL RECORD NO.:  [id number]                   PATIENT TYPE:  INP   LOCATION:  X002                                 FACILITY:  [facility]   PHYSICIAN:  Madlyn Frankel. Charlann Boxer, M.D.               DATE OF BIRTH:  08-Nov-1946   DATE OF PROCEDURE:  11/20/2003  DATE OF DISCHARGE:                                 OPERATIVE REPORT   PREOPERATIVE DIAGNOSIS:  Left knee end-stage osteoarthritis.   POSTOPERATIVE DIAGNOSIS:  Left knee end-stage osteoarthritis.   PROCEDURE:  Left total knee replacement.   COMPONENTS USED:  DePuy rotating-platform system with a size 4 femur, size 4  tibia, a 10 mm rotating platform size 4 component, with a 38 mm patella.   SURGEON:  Madlyn Frankel. Charlann Boxer, M.D.   ASSISTANT:  Clarene Reamer, P.A.-C.   ANESTHESIA:  General.   DRAINS:  One.   TOURNIQUET TIME:  100 minutes at 300 mmHg.   COMPLICATIONS:  None apparent.   INDICATION FOR PROCEDURE:  Veronica Hansen is a pleasant 65 year old female who  has been followed for a painful left total knee.  She has recently moved to  the area and was evaluated by one of my partners and sent over for  evaluation given her size.  She radiographically and clinically had evidence  of end-stage osteoarthritis of the left knee with significant osteophytosis.  She had a preoperative range of motion of 10-90 degrees.   PROCEDURE IN DETAIL:  The patient was brought to the operative theatre.  Once adequate anesthesia and preoperative antibiotics were administered, the  patient was positioned supine with a bump underneath the left hip.  A  proximal thigh tourniquet was placed.  The left lower extremity was then  prepped and draped in a sterile fashion.  A midline incision was made  followed by a medial parapatellar arthrotomy.  Once the knee was exposed,  the patient was noted to have significant osteophytosis.  There was a  significant amount of debridement of anterior  tibial osteophytes required  just for exposure as well as on the distal femur.  Following exposure of the  knee including medial and lateral meniscectomy and removal of both  cruciates, attention was directed at the femur.  The femur was entered with  an intramedullary device, an intramedullary guide placed at 5 degrees with  12 mm resection placed at 5 degrees.  Twelve millimeters of bone was  resected.  At this point a sizing jig was used and determined that a size 4  would be adequate.  The size 4 femoral cutting block was placed.  The  anterior, anterior chamfer, and posterior was made.  At this point the  trochlear notch, the box cutting guide was placed and the box placed on the  lateral aspect of the femur.  The box cut was made.  Following this the  posterior chamfer cut was  cut.  Trial femur was then placed and fit without  evidence of lift-off anywhere.  At this point attention was directed to the  tibia.  Following further exposure of the tibia and subluxation of the tibia  anteriorly, initially a 10 mm resection was made off the lateral side.  With  initial assessment of flexion-extension gaps it was now determined it was  extremely tight in both flexion and extension, so additional 4 mm of bone  was resected off the proximal tibia.  The patient was noted to have a large  proximal medial defect with significant osteophytosis.  Following the tibial  resection almost the entire deformity was removed.  A size 4 tibial tray fit  perfectly on the cut surface of the bone and allowed for a little more  debridement of osteophytes medially, which allowed for a medial release to  help with flexion gap.  The trial reduction was then carried out with size 4  and a 10 tray and 4 femur.  The knee came out to full extension and was  stable in flexion.  At this point attention was directed to the patella,  where the prepatellar cut was measured at 24 mm, 10 mm of bone was resected,  and a 38 mm  patella drilled and positioned.  Tracking was then assessed and  the patient's patella was noted to track without thumbs in flexion with good  flexion-extension.  The tibial rotation was marked for the fixed-bearing  prosthesis. Following this attention was directed toward final preparation  of the tibial surface with the rotating platform system.  The patient was  noted to have two significant cysts in the proximal tibia.  Both were  curetted out.  Following this, bone graft was prepared on the back table and  these large cystic cavities bone-grafted with all the graft bone.  Following  preparation of the tibia, the knee was copiously irrigated with normal  saline solution.  The bone cysts were packed and final components brought to  the field.  Once cement was prepared the tibial component was cemented into  position, excessive cement removed, the trial __________ button placed.  The  final femoral component was then cemented into position, the knee brought to  full extension to allow for cement curing, and extension to allow for  removal of cement.  The patellar component was cemented into position.  Once  cement had fully cured, the excessive cement was removed and debrided as  needed.  The patient's knee was extremely stable with the 10 mm rotating  platform poly.  There was no need to trial a size 9.  The final size 4  rotating platform poly was then inserted after cleaning and irrigating the  base plate.  At this point the knee was again irrigated.  The extensor  mechanism was closed over a drain using #1 PDS.  At this point the  tourniquet was let down.  The remainder of the wound was closed in layers,  including a 4-0 Monocryl.  The knee was then cleaned, dried, and dressed  sterilely with Steri-Strips, Adaptic dressing, bulky Jones dressing.  The  patient tolerated the procedure without complication, was transferred to the  recovery room in stable condition.  Madlyn Frankel Charlann Boxer, M.D.    MDO/MEDQ  D:  11/20/2003  T:  11/20/2003  Job:  [id number]

## 2011-08-20 ENCOUNTER — Encounter (INDEPENDENT_AMBULATORY_CARE_PROVIDER_SITE_OTHER): Payer: Self-pay | Admitting: General Surgery

## 2011-08-20 ENCOUNTER — Ambulatory Visit (INDEPENDENT_AMBULATORY_CARE_PROVIDER_SITE_OTHER): Payer: 59 | Admitting: General Surgery

## 2011-08-20 NOTE — Progress Notes (Signed)
Chief Complaint  Patient presents with  . Other    Established bariagtric - to discuss gastric sleeve    HPI Veronica Hansen is a 65 y.o. female.  This patient is known to our practice for evaluation for weight loss surgery. She is already been evaluated by Dr. Daphine Deutscher previously for a lap band procedure. She is already completed much of the preoperative evaluation. However, she did not pass her psychological evaluation. He recommended evaluation and treatment for depression as well as tighter glucose control and evaluation for eating behaviors. She is current on her preventive medicine such as a mammogram. She states that her most recent hemoglobin A1c was 14 which is up from 9 at her last visit. She attributes this to eating poorly. She comes to me today for consideration for sleeve gastrectomy. She states that this was presented to her by the psychologist and she has since done some research on the procedure and is interested in this.Her BMI is over 50 and she has diabetes hyperlipidemia hypertension and arthritis.She would certainly qualify from her weight and comorbidities. HPI  Past Medical History  Diagnosis Date  . Hypertension   . Hyperlipidemia   . Diabetes mellitus     type 2  . OSA (obstructive sleep apnea)   . SOB (shortness of breath)   . Incontinence   . Sinus problem   . Arthritis   . Runny nose     seasonal - poss allergies    Past Surgical History  Procedure Date  . Dental implants 2011  . Total knee arthroplasty 2005    left  . Total knee arthroplasty 2007    right    Family History  Problem Relation Age of Onset  . Diabetes Father   . Diabetes Brother     Social History History  Substance Use Topics  . Smoking status: Former Smoker    Quit date: 08/19/2005  . Smokeless tobacco: Never Used  . Alcohol Use: No    No Known Allergies  Current Outpatient Prescriptions  Medication Sig Dispense Refill  . aspirin 81 MG tablet Take 81 mg by mouth daily.         Marland Kitchen atenolol (TENORMIN) 25 MG tablet Take 25 mg by mouth daily.        . Calcium Carbonate-Vit D-Min (CALCIUM 1200 PO) Take by mouth daily.        . Cholecalciferol (VITAMIN D3 PO) Take by mouth daily.        Marland Kitchen GLIPIZIDE PO Take by mouth daily.        Marland Kitchen lisinopril-hydrochlorothiazide (PRINZIDE,ZESTORETIC) 20-25 MG per tablet Take 1 tablet by mouth daily.        . metFORMIN (GLUMETZA) 500 MG (MOD) 24 hr tablet Take 500 mg by mouth 4 (four) times daily.        . Multiple Vitamin (MULTIVITAMIN) tablet Take 1 tablet by mouth daily.        . simvastatin (ZOCOR) 80 MG tablet Take 80 mg by mouth at bedtime.          Review of Systems Review of Systems  Blood pressure 124/76, pulse 80, temperature 97.1 F (36.2 C), temperature source Temporal, resp. rate 20, height 5\' 7"  (1.702 m), weight 350 lb 9.6 oz (159.031 kg).  Physical Exam Physical Exam  Constitutional: She is oriented to person, place, and time. She appears well-developed and well-nourished. No distress.  HENT:  Head: Normocephalic and atraumatic.  Eyes: Conjunctivae and EOM are normal. Pupils are equal,  round, and reactive to light.  Neck: Normal range of motion. No tracheal deviation present.  Cardiovascular: Normal rate, regular rhythm and normal heart sounds.   Pulmonary/Chest: Effort normal and breath sounds normal. No stridor. No respiratory distress. She has no wheezes.  Abdominal: Soft. Bowel sounds are normal. She exhibits no distension. There is no tenderness. There is no rebound.  Musculoskeletal: Normal range of motion.  Neurological: She is alert and oriented to person, place, and time.  Skin: Skin is warm and dry. No rash noted. She is not diaphoretic. No erythema.  Psychiatric: She has a normal mood and affect. Her behavior is normal. Judgment and thought content normal.    Data Reviewed   Assessment    Morbid obesity with hypertension, diabetes, hyperlipidemia, and arthritis. She would certainly qualify for  any of the weight loss procedures from her weight and comorbidities standpoint. She does not have any significant reflux and would qualify for a sleeve gastrectomy if she can meet her psychiatric and nutritional standards. In discussing the procedures with her as well as the overall followup and requirements for weight loss surgery in general, she did state a few things which gave me some concern of her readiness for surgery. I'm not sure if she is really committed to make the lifestyle changes necessary to be successful and I discussed this with her. She expressed understanding and agreed to show some more initiative and compliance. Her biggest concern for her is her hemoglobin A1c of 14. This would certainly need to be remedied prior to any surgery. I told her that my preoperative goal would be less than 8 order to minimize surgical complications. I also explained that for treatment of her diabetes the gastric bypass would be the best surgery for her. She is scheduled to see a psychologist again for repeat evaluation and we will hold off on any procedures until she can show some commitment to the lifestyle changes and achieve clearance from a psychologist.    Plan    We discussed each of the 3 weight loss procedures that we offer including the risks and benefits of each as well as their preoperative and postoperative requirements. She is still interested in the sleeve gastrectomy. I have discussed her case with our bariatric coordinator, and we will work on getting her psychologic clearance and tighter glucose control in order to get her A1c down. We will see her back after she makes to require changes to discuss any further preoperative evaluation.       Lodema Pilot DAVID 08/20/2011, 3:57 PM

## 2011-11-17 HISTORY — PX: BARIATRIC SURGERY: SHX1103

## 2012-12-06 ENCOUNTER — Other Ambulatory Visit: Payer: Self-pay | Admitting: Obstetrics & Gynecology

## 2012-12-06 DIAGNOSIS — Z1231 Encounter for screening mammogram for malignant neoplasm of breast: Secondary | ICD-10-CM

## 2012-12-15 ENCOUNTER — Ambulatory Visit (HOSPITAL_COMMUNITY): Payer: 59

## 2012-12-23 ENCOUNTER — Ambulatory Visit (HOSPITAL_COMMUNITY)
Admission: RE | Admit: 2012-12-23 | Discharge: 2012-12-23 | Disposition: A | Discharge status: Home / Self Care | Payer: 59 | Source: Ambulatory Visit | Attending: Obstetrics & Gynecology | Admitting: Obstetrics & Gynecology

## 2012-12-23 DIAGNOSIS — Z1231 Encounter for screening mammogram for malignant neoplasm of breast: Secondary | ICD-10-CM | POA: Insufficient documentation

## 2014-03-07 ENCOUNTER — Ambulatory Visit (INDEPENDENT_AMBULATORY_CARE_PROVIDER_SITE_OTHER): Payer: Medicare Other | Admitting: Neurology

## 2014-03-07 ENCOUNTER — Encounter: Payer: Self-pay | Admitting: Neurology

## 2014-03-07 VITALS — BP 131/72 | HR 58 | Temp 97.0°F | Ht 67.0 in | Wt 283.0 lb

## 2014-03-07 DIAGNOSIS — G4710 Hypersomnia, unspecified: Secondary | ICD-10-CM

## 2014-03-07 DIAGNOSIS — Z9889 Other specified postprocedural states: Secondary | ICD-10-CM

## 2014-03-07 DIAGNOSIS — E669 Obesity, unspecified: Secondary | ICD-10-CM

## 2014-03-07 DIAGNOSIS — R400 Somnolence: Secondary | ICD-10-CM

## 2014-03-07 DIAGNOSIS — Z9989 Dependence on other enabling machines and devices: Secondary | ICD-10-CM

## 2014-03-07 DIAGNOSIS — G4733 Obstructive sleep apnea (adult) (pediatric): Secondary | ICD-10-CM | POA: Insufficient documentation

## 2014-03-07 HISTORY — DX: Obstructive sleep apnea (adult) (pediatric): G47.33

## 2014-03-07 NOTE — Progress Notes (Signed)
Subjective:    Patient ID: Veronica Hansen is a 68 y.o. female.  HPI    Star Age, MD, PhD [facility] [address] [place] [address]  Dear Dr. Philip Aspen,   I saw your patient, Veronica Hansen, upon your kind request in my neurologic clinic today for initial consultation of her obstructive sleep apnea. The patient is accompanied by her husband today. As you know, Veronica Hansen is a 68 year old right-handed woman with an underlying medical history of hypertension, hyperlipidemia, diabetes, morbid obesity, osteoarthritis, status post b/l total knee arthroplasty (L 2005, R 2007), depression, allergic rhinitis, psoriasis, and prior smoking, who was diagnosed with sleep apnea approximately 15 years ago and since then has not had a formal followup. She has had a machine that is quite old and lately when she uses it it does not feel quite right. She needs reevaluation for sleep apnea to be able to get a new machine and equipment. She tries to be in bed by 10-11 PM and falls asleep quickly and her wake time is 6:30 AM.  She wakes up a few times during the night. She wakes up fairly well rested, but something with the machine settings does not seem right. Of note, she had gastric sleeve in 4/14 and lost 80 lb thus far. Her mask may not fit as well. She brought in her machine, which is a Resmed S8 and I could not determine, what pressure she is on. Ramp is 25 min and she uses a [employer] nasal mask. I do not have the prior test results available for review and she does not have her results and does not know her pressure. She likes to nap in the afternoon from 2 to 3 PM in her recliner. She has a TV in the bedroom and has it on a 1 hour timer at night. Of note, when she naps, she does not use her CPAP and does not like to travel with it.   She has been known to snore for the past many years. Snoring is reportedly marked, and associated with choking  sounds and witnessed apneas. The patient admits to a sense of choking or strangling feeling. This was before treatment. She quit smoking in 2001 and drinks very little alcohol.   She is tired during the day and her ESS is 10/24.    She denies cataplexy, sleep paralysis, hypnagogic or hypnopompic hallucinations, or sleep attacks. She does not report any vivid dreams, nightmares, dream enactments, or parasomnias, such as sleep talking or sleep walking. The patient has not had a sleep study or a home sleep test since her original sleep test in approximately 2000.  She consumes 5 caffeinated beverages per day, usually in the form of coffee, last one around 2 PM.   Her Past Medical History Is Significant For: Past Medical History  Diagnosis Date  . Hypertension   . Hyperlipidemia   . Diabetes mellitus     type 2  . OSA (obstructive sleep apnea)   . SOB (shortness of breath)   . Incontinence   . Sinus problem   . Arthritis   . Runny nose     seasonal - poss allergies  . OSA on CPAP 03/07/2014    Her Past Surgical History Is Significant For: Past Surgical History  Procedure Laterality Date  . Dental implants  2011  . Total knee arthroplasty  2005    left  . Total knee arthroplasty  2007    right    Her Family History Is Significant For: Family History  Problem Relation Age of Onset  . Diabetes Father   . Diabetes Brother     Her Social History Is Significant For: History   Social History  . Marital Status: Married    Spouse Name: N/A    Number of Children: N/A  . Years of Education: N/A   Social History Main Topics  . Smoking status: Former Smoker    Quit date: 08/19/2005  . Smokeless tobacco: Never Used  . Alcohol Use: No  . Drug Use: No  . Sexual Activity: None   Other Topics Concern  . None   Social History Narrative  . None    Her Allergies Are:  No Known Allergies:   Her Current Medications Are:  Outpatient Encounter Prescriptions as of 03/07/2014   Medication Sig  . atenolol (TENORMIN) 25 MG tablet Take 25 mg by mouth daily.    . Calcium Carbonate-Vit D-Min (CALCIUM 1200 PO) Take by mouth daily.    . Cholecalciferol (VITAMIN D3 PO) Take by mouth daily.    Marland Kitchen lisinopril-hydrochlorothiazide (PRINZIDE,ZESTORETIC) 20-25 MG per tablet Take 1 tablet by mouth daily.    . Multiple Vitamin (MULTIVITAMIN) tablet Take 1 tablet by mouth daily.    Marland Kitchen omeprazole (PRILOSEC) 20 MG capsule Take 1 capsule by mouth daily.  . simvastatin (ZOCOR) 80 MG tablet Take 80 mg by mouth at bedtime.    Marland Kitchen aspirin 81 MG tablet Take 81 mg by mouth daily.    . [DISCONTINUED] GLIPIZIDE PO Take by mouth daily.    . [DISCONTINUED] metFORMIN (GLUMETZA) 500 MG (MOD) 24 hr tablet Take 500 mg by mouth 4 (four) times daily.    :  Review of Systems:  Out of a complete 14 point review of systems, all are reviewed and negative with the exception of these symptoms as listed below:   Review of Systems  Constitutional: Negative.   HENT: Negative.   Eyes: Negative.   Respiratory: Positive for apnea (snoringf).   Cardiovascular: Negative.   Gastrointestinal: Positive for blood in stool.  Endocrine: Negative.   Genitourinary: Negative.   Musculoskeletal: Negative.   Skin: Negative.   Allergic/Immunologic: Negative.   Neurological: Negative.   Hematological: Negative.   Psychiatric/Behavioral: Positive for sleep disturbance (snoring) and dysphoric mood.    Objective:  Neurologic Exam  Physical Exam Physical Examination:   Filed Vitals:   03/07/14 1041  BP: 131/72  Pulse: 58  Temp: 97 F (36.1 C)    General Examination: The patient is a very pleasant 68 y.o. female in no acute distress. She appears well-developed and well-nourished and well groomed.   HEENT: Normocephalic, atraumatic, pupils are equal, round and reactive to light and accommodation. Funduscopic exam is normal with sharp disc margins noted. Extraocular tracking is good without limitation to gaze  excursion or nystagmus noted. Normal smooth pursuit is noted. Hearing is grossly intact. Tympanic membranes are clear bilaterally. Face is symmetric with normal facial animation and normal facial sensation. Speech is clear with no dysarthria noted. There is no hypophonia. There is no lip, neck/head, jaw or voice tremor. Neck is supple with full range of passive and active motion. There are no carotid bruits on auscultation. Oropharynx exam reveals: mild mouth dryness, adequate dental hygiene with dentures in place and moderate airway crowding, due to larger tongue and larger uvula. Mallampati is class II. Tongue protrudes centrally and palate elevates symmetrically. Tonsils are absent. Neck size  is 19.5 inches.   Chest: Clear to auscultation without wheezing, rhonchi or crackles noted.  Heart: S1+S2+0, regular and normal without murmurs, rubs or gallops noted.   Abdomen: Soft, non-tender and non-distended with normal bowel sounds appreciated on auscultation.  Extremities: There is no pitting edema in the distal lower extremities bilaterally. Pedal pulses are intact.  Skin: Warm and dry without trophic changes noted. There are no varicose veins.  Musculoskeletal: exam reveals no obvious joint deformities, tenderness or joint swelling or erythema.   Neurologically:  Mental status: The patient is awake, alert and oriented in all 4 spheres. Her immediate and remote memory, attention, language skills and fund of knowledge are appropriate. There is no evidence of aphasia, agnosia, apraxia or anomia. Speech is clear with normal prosody and enunciation. Thought process is linear. Mood is normal and affect is normal.  Cranial nerves II - XII are as described above under HEENT exam. In addition: shoulder shrug is normal with equal shoulder height noted. Motor exam: Normal bulk, strength and tone is noted. There is no drift, tremor or rebound. Romberg is negative. Reflexes are 2+ throughout. Babinski: Toes are  flexor bilaterally. Fine motor skills and coordination: intact with normal finger taps, normal hand movements, normal rapid alternating patting, normal foot taps and normal foot agility.  Cerebellar testing: No dysmetria or intention tremor on finger to nose testing. Heel to shin is unremarkable bilaterally. There is no truncal or gait ataxia.  Sensory exam: intact to light touch, pinprick, vibration, temperature sense in the upper and lower extremities.  Gait, station and balance: She stands easily. No veering to one side is noted. No leaning to one side is noted. Posture is age-appropriate and stance is narrow based. Gait shows normal stride length and normal pace. No problems turning are noted. She turns en bloc. Tandem walk is unremarkable. Intact toe and heel stance is noted.               Assessment and Plan:   In summary, Veronica Hansen is a very pleasant 68 y.o.-year old female with an underlying medical history of hypertension, hyperlipidemia, diabetes, morbid obesity, osteoarthritis, status post b/l total knee arthroplasty (L 2005, R 2007), depression, allergic rhinitis, psoriasis, and prior smoking, who was diagnosed with sleep apnea approximately 15 years ago and since then has been using a CPAP machine faithfully, but has not had a formal followup in years. She has an older CPAP machine and feels, either the pressure or mask or the machine are not working correctly for her. In light of her weight loss with gastric sleeve surgery 1 year ago she would benefit from reevaluation and adjustment of her treatment. I also believe she needs a new machine. I had a long chat with the patient and her husband about my findings and the diagnosis of OSA, its prognosis and treatment options. We talked about medical treatments, surgical interventions and non-pharmacological approaches. I explained in particular the risks and ramifications of untreated moderate to severe OSA, especially with respect to  developing cardiovascular disease down the Road, including congestive heart failure, difficult to treat hypertension, cardiac arrhythmias, or stroke. Even type 2 diabetes has, in part, been linked to untreated OSA. Symptoms of untreated OSA include daytime sleepiness, memory problems, mood irritability and mood disorder such as depression and anxiety, lack of energy, as well as recurrent headaches, especially morning headaches. She is aware of the importance of treating sleep apnea but  I also advised her to use her CPAP  when she naps. She is commended on using CPAP regularly. She is encouraged to also take her machine with her when she travels. We talked about trying to maintain a healthy lifestyle in general, as well as the importance of weight control. I encouraged the patient to eat healthy, exercise daily and keep well hydrated, to keep a scheduled bedtime and wake time routine, to not skip any meals and eat healthy snacks in between meals. I advised the patient not to drive when feeling sleepy. I recommended the following at this time: sleep study with potential positive airway pressure titration.  I explained the sleep test procedure to the patient and also outlined possible surgical and non-surgical treatment options of OSA, including the use of a custom-made dental device (which would require a referral to a specialist dentist or oral surgeon), upper airway surgical options, such as pillar implants, radiofrequency surgery, tongue base surgery, and UPPP (which would involve a referral to an ENT surgeon). Rarely, jaw surgery such as mandibular advancement may be considered.  I also explained the CPAP treatment option to the patient, who indicated that she would be willing to try a new CPAP machine and mask and to continue using CPAP. I explained the importance of being compliant with PAP treatment, not only for insurance purposes but primarily to improve Her symptoms, and for the patient's long term health  benefit, including to reduce Her cardiovascular risks. I answered all their questions today and the patient and her husband were in agreement. I would like to see her back after the sleep study is completed and encouraged them to call with any interim questions, concerns, problems or updates.   Thank you very much for allowing me to participate in the care of this nice patient. If I can be of any further assistance to you please do not hesitate to call me at [phone].  Sincerely,   Star Age, MD, PhD

## 2014-03-07 NOTE — Patient Instructions (Signed)
Based on your symptoms and your exam, you have obstructive sleep apnea or OSA, and I think we should proceed with a sleep study to determine how severe it is now and we will help you get a new machine with proper settings. Please remember, the risks and ramifications of moderate to severe obstructive sleep apnea or OSA are: Cardiovascular disease, including congestive heart failure, stroke, difficult to control hypertension, arrhythmias, and even type 2 diabetes has been linked to untreated OSA. Sleep apnea causes disruption of sleep and sleep deprivation in most cases, which, in turn, can cause recurrent headaches, problems with memory, mood, concentration, focus, and vigilance. Most people with untreated sleep apnea report excessive daytime sleepiness, which can affect their ability to drive. Please do not drive if you feel sleepy.  I will see you back after your sleep study to go over the test results and where to go from there. We will call you after your sleep study and to set up an appointment at the time.

## 2014-04-13 ENCOUNTER — Ambulatory Visit (INDEPENDENT_AMBULATORY_CARE_PROVIDER_SITE_OTHER): Payer: Medicare Other | Admitting: Neurology

## 2014-04-13 DIAGNOSIS — E669 Obesity, unspecified: Secondary | ICD-10-CM

## 2014-04-13 DIAGNOSIS — R400 Somnolence: Secondary | ICD-10-CM

## 2014-04-13 DIAGNOSIS — G4733 Obstructive sleep apnea (adult) (pediatric): Secondary | ICD-10-CM

## 2014-04-27 ENCOUNTER — Telehealth: Payer: Self-pay | Admitting: Neurology

## 2014-04-27 DIAGNOSIS — G4733 Obstructive sleep apnea (adult) (pediatric): Secondary | ICD-10-CM

## 2014-04-27 NOTE — Telephone Encounter (Signed)
Please call and notify patient that the recent sleep study confirmed the diagnosis of OSA. She did very well with CPAP during the study with significant improvement of the respiratory events. Therefore, I would like start the patient on CPAP at home. I placed the order in the chart.   Arrange for CPAP set up at home through a DME company of patient's choice and fax/route report to PCP and referring MD (if other than PCP).   The patient will also need a follow up appointment with me in 6-8 weeks post set up that has to be scheduled; help the patient schedule this (in a follow-up slot).   Please re-enforce the importance of compliance with treatment and the need for us to monitor compliance data.   Once you have spoken to the patient and scheduled the return appointment, you may close this encounter, thanks,   Kelilah Hebard, MD, PhD [facility]    

## 2014-04-30 ENCOUNTER — Encounter: Payer: Self-pay | Admitting: *Deleted

## 2014-04-30 NOTE — Telephone Encounter (Signed)
I called and left a message for the patient about her recent sleep study results. I informed the patient that the study did confirm the diagnosis of obstructive sleep apnea and that she did well on CPAP during the night of her study with significant improvement of respiratory events. Dr. Rexene Alberts recommend starting CPAP therapy at home. I will send the order to [facility] who will contact the patient. I will fax Dr. Kaylyn Lim office a copy of the report and mail a copy to the patient along with a follow up instruction letter.

## 2014-04-30 NOTE — Telephone Encounter (Signed)
I called and left a message for the patient to callback for her sleep study results.

## 2014-08-01 ENCOUNTER — Telehealth: Payer: Self-pay | Admitting: Neurology

## 2014-08-01 ENCOUNTER — Encounter: Payer: Self-pay | Admitting: Neurology

## 2014-08-01 NOTE — Telephone Encounter (Signed)
I called and spoke with the patient about scheduling her post CPAP visit with Dr. Rexene Alberts. Patient stated she will callback on next Wednesday to schedule her follow up appointment with Dr. Rexene Alberts.

## 2014-08-01 NOTE — Progress Notes (Signed)
Quick Note:  I reviewed the patient's CPAP compliance data from 06/02/2014 to 07/01/2014, which is a total of 30 days, during which time the patient used CPAP only on 20 days. The average usage for all days was or hours and 55 minutes. The percent used days greater than 4 hours was only 63 %, indicating suboptimal compliance. The residual AHI was 3.1 per hour, indicating an appropriate treatment pressure of 8 cwp with EPR of 2. Air leak from the mask was low at 8.8 L per minute at the 95th percentile. I will review this data with the patient at the next office visit, which is currently not scheduled. We will get in touch with the patient to schedule a followup appointment and remind her to be more consistent with her CPAP usage.  Star Age, MD, PhD [facility]   ______

## 2015-02-22 ENCOUNTER — Other Ambulatory Visit: Payer: Self-pay | Admitting: Internal Medicine

## 2015-02-22 DIAGNOSIS — Z1231 Encounter for screening mammogram for malignant neoplasm of breast: Secondary | ICD-10-CM

## 2015-04-12 ENCOUNTER — Ambulatory Visit
Admission: RE | Admit: 2015-04-12 | Discharge: 2015-04-12 | Disposition: A | Discharge status: Home / Self Care | Payer: Medicare Other | Source: Ambulatory Visit | Attending: Internal Medicine | Admitting: Internal Medicine

## 2015-04-12 DIAGNOSIS — Z1231 Encounter for screening mammogram for malignant neoplasm of breast: Secondary | ICD-10-CM

## 2016-01-13 ENCOUNTER — Encounter (INDEPENDENT_AMBULATORY_CARE_PROVIDER_SITE_OTHER): Payer: Self-pay | Admitting: Ophthalmology

## 2016-01-16 ENCOUNTER — Encounter (INDEPENDENT_AMBULATORY_CARE_PROVIDER_SITE_OTHER): Payer: Medicare Other | Admitting: Ophthalmology

## 2016-01-16 DIAGNOSIS — H43813 Vitreous degeneration, bilateral: Secondary | ICD-10-CM | POA: Diagnosis not present

## 2016-01-16 DIAGNOSIS — H35372 Puckering of macula, left eye: Secondary | ICD-10-CM

## 2016-01-16 DIAGNOSIS — H2513 Age-related nuclear cataract, bilateral: Secondary | ICD-10-CM | POA: Diagnosis not present

## 2016-01-16 DIAGNOSIS — H353121 Nonexudative age-related macular degeneration, left eye, early dry stage: Secondary | ICD-10-CM | POA: Diagnosis not present

## 2016-01-16 DIAGNOSIS — I10 Essential (primary) hypertension: Secondary | ICD-10-CM | POA: Diagnosis not present

## 2016-01-16 DIAGNOSIS — H35033 Hypertensive retinopathy, bilateral: Secondary | ICD-10-CM

## 2016-06-16 DIAGNOSIS — J189 Pneumonia, unspecified organism: Secondary | ICD-10-CM

## 2016-06-16 HISTORY — DX: Pneumonia, unspecified organism: J18.9

## 2016-12-23 ENCOUNTER — Other Ambulatory Visit: Payer: Self-pay | Admitting: Internal Medicine

## 2016-12-23 DIAGNOSIS — R1310 Dysphagia, unspecified: Secondary | ICD-10-CM

## 2017-01-26 ENCOUNTER — Other Ambulatory Visit: Payer: Medicare Other

## 2017-02-02 ENCOUNTER — Other Ambulatory Visit: Payer: Medicare Other

## 2017-10-26 ENCOUNTER — Other Ambulatory Visit: Payer: Medicare Other

## 2017-10-29 ENCOUNTER — Ambulatory Visit
Admission: RE | Admit: 2017-10-29 | Discharge: 2017-10-29 | Disposition: A | Discharge status: Home / Self Care | Payer: Medicare Other | Source: Ambulatory Visit | Attending: Internal Medicine | Admitting: Internal Medicine

## 2017-10-29 DIAGNOSIS — R1310 Dysphagia, unspecified: Secondary | ICD-10-CM

## 2018-07-17 DIAGNOSIS — I4891 Unspecified atrial fibrillation: Secondary | ICD-10-CM

## 2018-07-17 HISTORY — DX: Unspecified atrial fibrillation: I48.91

## 2018-07-28 ENCOUNTER — Encounter (HOSPITAL_COMMUNITY): Payer: Self-pay | Admitting: Nurse Practitioner

## 2018-07-28 ENCOUNTER — Ambulatory Visit (HOSPITAL_COMMUNITY)
Admission: RE | Admit: 2018-07-28 | Discharge: 2018-07-28 | Disposition: A | Discharge status: Home / Self Care | Payer: Medicare Other | Source: Ambulatory Visit | Attending: Nurse Practitioner | Admitting: Nurse Practitioner

## 2018-07-28 VITALS — BP 126/84 | HR 79 | Ht 68.0 in | Wt 291.0 lb

## 2018-07-28 DIAGNOSIS — I4892 Unspecified atrial flutter: Secondary | ICD-10-CM | POA: Diagnosis present

## 2018-07-28 DIAGNOSIS — E785 Hyperlipidemia, unspecified: Secondary | ICD-10-CM | POA: Insufficient documentation

## 2018-07-28 DIAGNOSIS — E669 Obesity, unspecified: Secondary | ICD-10-CM | POA: Insufficient documentation

## 2018-07-28 DIAGNOSIS — G4733 Obstructive sleep apnea (adult) (pediatric): Secondary | ICD-10-CM | POA: Diagnosis not present

## 2018-07-28 DIAGNOSIS — Z96653 Presence of artificial knee joint, bilateral: Secondary | ICD-10-CM | POA: Diagnosis not present

## 2018-07-28 DIAGNOSIS — I483 Typical atrial flutter: Secondary | ICD-10-CM | POA: Diagnosis not present

## 2018-07-28 DIAGNOSIS — Z6841 Body Mass Index (BMI) 40.0 and over, adult: Secondary | ICD-10-CM | POA: Diagnosis not present

## 2018-07-28 DIAGNOSIS — Z87891 Personal history of nicotine dependence: Secondary | ICD-10-CM | POA: Insufficient documentation

## 2018-07-28 DIAGNOSIS — I10 Essential (primary) hypertension: Secondary | ICD-10-CM | POA: Diagnosis not present

## 2018-07-28 DIAGNOSIS — E119 Type 2 diabetes mellitus without complications: Secondary | ICD-10-CM | POA: Diagnosis not present

## 2018-07-28 DIAGNOSIS — Z79899 Other long term (current) drug therapy: Secondary | ICD-10-CM | POA: Insufficient documentation

## 2018-07-28 LAB — COMPREHENSIVE METABOLIC PANEL
ALT: 14 U/L (ref 0–44)
AST: 18 U/L (ref 15–41)
Albumin: 3.9 g/dL (ref 3.5–5.0)
Alkaline Phosphatase: 43 U/L (ref 38–126)
Anion gap: 8 (ref 5–15)
BUN: 18 mg/dL (ref 8–23)
CO2: 25 mmol/L (ref 22–32)
Calcium: 9.3 mg/dL (ref 8.9–10.3)
Chloride: 107 mmol/L (ref 98–111)
Creatinine, Ser: 1.08 mg/dL — ABNORMAL HIGH (ref 0.44–1.00)
GFR calc Af Amer: 59 mL/min — ABNORMAL LOW (ref >60)
GFR calc non Af Amer: 51 mL/min — ABNORMAL LOW (ref >60)
Glucose, Bld: 127 mg/dL — ABNORMAL HIGH (ref 70–99)
Potassium: 4.5 mmol/L (ref 3.5–5.1)
Sodium: 140 mmol/L (ref 135–145)
Total Bilirubin: 0.8 mg/dL (ref 0.3–1.2)
Total Protein: 7 g/dL (ref 6.5–8.1)

## 2018-07-28 LAB — CBC
HCT: 48.6 % — ABNORMAL HIGH (ref 36.0–46.0)
Hemoglobin: 15.7 g/dL — ABNORMAL HIGH (ref 12.0–15.0)
MCH: 28 pg (ref 26.0–34.0)
MCHC: 32.3 g/dL (ref 30.0–36.0)
MCV: 86.6 fL (ref 78.0–100.0)
Platelets: 167 10*3/uL (ref 150–400)
RBC: 5.61 MIL/uL — ABNORMAL HIGH (ref 3.87–5.11)
RDW: 13.5 % (ref 11.5–15.5)
WBC: 5.1 10*3/uL (ref 4.0–10.5)

## 2018-07-28 LAB — TSH: TSH: 1.875 u[IU]/mL (ref 0.350–4.500)

## 2018-07-28 NOTE — Patient Instructions (Signed)
Melissa will be in touch to schedule an appointment with Dr. Cristopher Peru to discuss ablation for aflutter.  Debbie with the [facility] will be in touch with you for exercise program

## 2018-07-28 NOTE — Progress Notes (Signed)
Primary Care Physician: Sharen Hint, MD Referring Physician: Reginold Agent, NP   Veronica Hansen is a 72 y.o. female with a h/o obesity, DM, HTN, OSA on CPAP that is in the afib clinic for evaluation of atrial flutter, new onset. Pt reports that she had felt tired and short of breath especially trying to do her Zumba class. Her husband insisted that she go to her PCP. EKG from PCP showed typical atrial flutter, rate controlled. Pt was already on atenolol 50 mg daily. Eliquis 5 mg bid was started on 9/10, chadsvasc score of 4.   Ekg in afib clinic shows typical atrial flutter at 79 bpm. She denies alcohol /tobacco use. Does use Cpap, but was on  vacation recently for  a week+ and did not use.  H/o of gastric bypass in the past but has gained back weight. She is interested in attending [facility] supervised exercise/edcuation class .  Today, she denies symptoms of palpitations, chest pain, shortness of breath, orthopnea, PND, lower extremity edema, dizziness, presyncope, syncope, or neurologic sequela. The patient is tolerating medications without difficulties and is otherwise without complaint today.   Past Medical History:  Diagnosis Date  . Arthritis   . Diabetes mellitus    type 2  . Hyperlipidemia   . Hypertension   . Incontinence   . OSA (obstructive sleep apnea)   . OSA on CPAP 03/07/2014  . Runny nose    seasonal - poss allergies  . Sinus problem   . SOB (shortness of breath)    Past Surgical History:  Procedure Laterality Date  . dental implants  2011  . 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 TOTAL KNEE ARTHROPLASTY  2007   right    Current Outpatient Medications  Medication Sig Dispense Refill  . apixaban (ELIQUIS) 5 MG TABS tablet Take 5 mg by mouth 2 (two) times daily.    Marland Kitchen atenolol (TENORMIN) 50 MG tablet Take 50 mg by mouth daily.    . Biotin 1 MG CAPS Take by mouth.    . Calcium Carbonate-Vit D-Min (CALCIUM 1200 PO) Take by mouth daily.      .  Cholecalciferol (VITAMIN D3 PO) Take by mouth daily.      Marland Kitchen lisinopril (PRINIVIL,ZESTRIL) 20 MG tablet Take 20 mg by mouth daily.    . Multiple Vitamin (MULTIVITAMIN) tablet Take 1 tablet by mouth daily.      . Multiple Vitamins-Minerals (PRESERVISION AREDS 2+MULTI VIT PO) Take by mouth.    Marland Kitchen omeprazole (PRILOSEC) 20 MG capsule Take 1 capsule by mouth daily.    . simvastatin (ZOCOR) 80 MG tablet Take 80 mg by mouth at bedtime.       No current facility-administered medications for this encounter.     No Known Allergies  Social History   Socioeconomic History  . Marital status: Married    Spouse name: Not on file  . Number of children: Not on file  . Years of education: Not on file  . Highest education level: Not on file  Occupational History  . Not on file  Social Needs  . Financial resource strain: Not on file  . Food insecurity:    Worry: Not on file    Inability: Not on file  . Transportation needs:    Medical: Not on file    Non-medical: Not on file  Tobacco Use  . Smoking status: Former Smoker    Last attempt to quit: 08/19/2005    Years  since quitting: 12.9  . Smokeless tobacco: Never Used  Substance and Sexual Activity  . Alcohol use: No  . Drug use: No  . Sexual activity: Not on file  Lifestyle  . Physical activity:    Days per week: Not on file    Minutes per session: Not on file  . Stress: Not on file  Relationships  . Social connections:    Talks on phone: Not on file    Gets together: Not on file    Attends religious service: Not on file    Active member of club or organization: Not on file    Attends meetings of clubs or organizations: Not on file    Relationship status: Not on file  . Intimate partner violence:    Fear of current or ex partner: Not on file    Emotionally abused: Not on file    Physically abused: Not on file    Forced sexual activity: Not on file  Other Topics Concern  . Not on file  Social History Narrative  . Not on file     Family History  Problem Relation Age of Onset  . Diabetes Father   . Diabetes Brother     ROS- All systems are reviewed and negative except as per the HPI above  Physical Exam: Vitals:   07/28/18 0847  BP: 126/84  Pulse: 79  Weight: 132 kg  Height: 5\' 8"  (1.727 m)   Wt Readings from Last 3 Encounters:  07/28/18 132 kg  03/07/14 128.4 kg  08/20/11 (!) 159 kg    Labs: Lab Results  Component Value Date   NA 140 07/28/2018   K 4.5 07/28/2018   CL 107 07/28/2018   CO2 25 07/28/2018   GLUCOSE 127 (H) 07/28/2018   BUN 18 07/28/2018   CREATININE 1.08 (H) 07/28/2018   CALCIUM 9.3 07/28/2018   No results found for: INR No results found for: CHOL, HDL, LDLCALC, TRIG   GEN- The patient is well appearing, alert and oriented x 3 today.   Head- normocephalic, atraumatic Eyes-  Sclera clear, conjunctiva pink Ears- hearing intact Oropharynx- clear Neck- supple, no JVP Lymph- no cervical lymphadenopathy Lungs- Clear to ausculation bilaterally, normal work of breathing Heart- Regular rate and rhythm, no murmurs, rubs or gallops, PMI not laterally displaced GI- soft, NT, ND, + BS Extremities- no clubbing, cyanosis, or edema MS- no significant deformity or atrophy Skin- no rash or lesion Psych- euthymic mood, full affect Neuro- strength and sensation are intact  EKG- Typical appearing atrial flutter with variable block, v rate 79 bpm PCP notes reviewed    Assessment and Plan: 1. New onset typical atrial flutter  Rate controlled General education re atrial flutter  Possible atrial flutter ablation candidate  instead of planning on cardioversion  Continue atenolol 50 mg a day  Echo ordered  2. Chadsvasc score of 4 Continue eliquis 5 mg bid, started 9/10 Bleeding precautions discussed   3. HTN Stable  4. Obesity Pt is interested in weight management class as well as [facility] sponsored exercsie program   Will refer to Dr. Lovena Le for  consideration for typical  atrial flutter ablation   Butch Penny C. Chalee Hirota, [facility] [address] [place], [place] [address] [phone]

## 2018-07-29 ENCOUNTER — Encounter: Payer: Self-pay | Admitting: Internal Medicine

## 2018-07-29 ENCOUNTER — Telehealth: Payer: Self-pay

## 2018-07-29 ENCOUNTER — Ambulatory Visit: Payer: Medicare Other | Admitting: Internal Medicine

## 2018-07-29 VITALS — BP 104/62 | HR 92 | Ht 68.0 in | Wt 292.4 lb

## 2018-07-29 DIAGNOSIS — I483 Typical atrial flutter: Secondary | ICD-10-CM | POA: Diagnosis not present

## 2018-07-29 NOTE — Telephone Encounter (Signed)
Left a VM for Veronica Hansen to call back in regards to her referral for the PREP at [facility].

## 2018-07-29 NOTE — H&P (View-Only) (Signed)
HPI Veronica Hansen is referred today by Dr. Sharlett Iles for evaluation of atrial flutter. She is a pleasant 72 yo obese woman with HTN who was found to have symptomatic atrial flutter. She does not have palpitations but has sob with exertion. She denies chest pain or sob at rest. No syncope. She has been on rate control with her beta blocker. Her ECG in our atrial fib clinic showed typical atrial flutter with variable AV block. She has been on systemic anti-coagulation for almost a week. No Known Allergies   Current Outpatient Medications  Medication Sig Dispense Refill  . apixaban (ELIQUIS) 5 MG TABS tablet Take 5 mg by mouth 2 (two) times daily.    Marland Kitchen atenolol (TENORMIN) 50 MG tablet Take 50 mg by mouth daily.    . Biotin 1 MG CAPS Take 1 capsule by mouth daily.     . Calcium Carbonate-Vit D-Min (CALCIUM 1200 PO) Take 1 tablet by mouth daily.     . Cholecalciferol (VITAMIN D3 PO) Take 1 tablet by mouth daily.     Marland Kitchen lisinopril (PRINIVIL,ZESTRIL) 20 MG tablet Take 20 mg by mouth daily.    . Multiple Vitamin (MULTIVITAMIN) tablet Take 1 tablet by mouth daily.      . Multiple Vitamins-Minerals (PRESERVISION AREDS 2+MULTI VIT PO) Take 1 tablet by mouth 2 (two) times daily.     Marland Kitchen omeprazole (PRILOSEC) 20 MG capsule Take 1 capsule by mouth daily.    . simvastatin (ZOCOR) 80 MG tablet Take 80 mg by mouth at bedtime.       No current facility-administered medications for this visit.      Past Medical History:  Diagnosis Date  . Arthritis   . Diabetes mellitus    type 2  . Hyperlipidemia   . Hypertension   . Incontinence   . OSA (obstructive sleep apnea)   . OSA on CPAP 03/07/2014  . Runny nose    seasonal - poss allergies  . Sinus problem   . SOB (shortness of breath)     ROS:   All systems reviewed and negative except as noted in the HPI.   Past Surgical History:  Procedure Laterality Date  . dental implants  2011  . 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 TOTAL KNEE  ARTHROPLASTY  2007   right     Family History  Problem Relation Age of Onset  . Diabetes Father   . Diabetes Brother      Social History   Socioeconomic History  . Marital status: Married    Spouse name: Not on file  . Number of children: Not on file  . Years of education: Not on file  . Highest education level: Not on file  Occupational History  . Not on file  Social Needs  . Financial resource strain: Not on file  . Food insecurity:    Worry: Not on file    Inability: Not on file  . Transportation needs:    Medical: Not on file    Non-medical: Not on file  Tobacco Use  . Smoking status: Former Smoker    Last attempt to quit: 08/19/2005    Years since quitting: 12.9  . Smokeless tobacco: Never Used  Substance and Sexual Activity  . Alcohol use: No  . Drug use: No  . Sexual activity: Not on file  Lifestyle  . Physical activity:    Days per week: Not on file    Minutes per  session: Not on file  . Stress: Not on file  Relationships  . Social connections:    Talks on phone: Not on file    Gets together: Not on file    Attends religious service: Not on file    Active member of club or organization: Not on file    Attends meetings of clubs or organizations: Not on file    Relationship status: Not on file  . Intimate partner violence:    Fear of current or ex partner: Not on file    Emotionally abused: Not on file    Physically abused: Not on file    Forced sexual activity: Not on file  Other Topics Concern  . Not on file  Social History Narrative  . Not on file     BP 104/62   Pulse 92   Ht 5\' 8"  (1.727 m)   Wt 292 lb 6.4 oz (132.6 kg)   SpO2 94%   BMI 44.46 kg/m   Physical Exam:  Well appearing NAD HEENT: Unremarkable Neck:  6 cm JVD, no thyromegally Lymphatics:  No adenopathy Back:  No CVA tenderness Lungs:  Clear with no wheezes HEART:  Regular rate rhythm, no murmurs, no rubs, no clicks Abd:  soft, positive bowel sounds, no organomegally,  no rebound, no guarding Ext:  2 plus pulses, no edema, no cyanosis, no clubbing Skin:  No rashes no nodules Neuro:  CN II through XII intact, motor grossly intact  EKG - reviewed atrial flutter with a controlled VR   Assess/Plan: 1. Atrial flutter - I have reviewed the treatment options with the patient. The risks/benefits/goals/expectations have been discussed and she would like to proceed with catheter ablation of her atrial flutter. We will require she have at least 3 weeks of systemic anti-coagulation. 2. HTN - her blood pressure is controlled. She will continue her current meds. 3. Obesity - I discussed the importance of weight loss. She states that she will try to lose weight.   Mikle Bosworth.D.

## 2018-07-29 NOTE — Patient Instructions (Addendum)
Medication Instructions:  Your physician recommends that you continue on your current medications as directed. Please refer to the Current Medication list given to you today.  Labwork: None ordered.  Testing/Procedures: Your physician has recommended that you have an ablation. Catheter ablation is a medical procedure used to treat some cardiac arrhythmias (irregular heartbeats). During catheter ablation, a long, thin, flexible tube is put into a blood vessel in your groin (upper thigh), or neck. This tube is called an ablation catheter. It is then guided to your heart through the blood vessel. Radio frequency waves destroy small areas of heart tissue where abnormal heartbeats may cause an arrhythmia to start. Please see the instruction sheet given to you today.  Follow-Up:  You will follow up with Dr. Lovena Le 4 weeks after your procedure.    ABLATION INSTRUCTIONS:  Please arrive at the [facility] main entrance of [facility] hospital at:  5:30 am on August 19, 2018 DO NOT MISS ANY DOSES OF YOUR ELIQUIS Do not eat or drink after midnight prior to procedure Do not take any medications the morning of the procedure.  Hold your atenolol for 2 days prior to your procedure. You will be discharged after you are stabilized You will need someone to drive you home at discharge     If you need a refill on your cardiac medications before your next appointment, please call your pharmacy.   Cardiac Ablation Cardiac ablation is a procedure to disable (ablate) a small amount of heart tissue in very specific places. The heart has many electrical connections. Sometimes these connections are abnormal and can cause the heart to beat very fast or irregularly. Ablating some of the problem areas can improve the heart rhythm or return it to normal. Ablation may be done for people who:  Have Wolff-Parkinson-White syndrome.  Have fast heart rhythms (tachycardia).  Have taken medicines for an abnormal heart  rhythm (arrhythmia) that were not effective or caused side effects.  Have a high-risk heartbeat that may be life-threatening.  During the procedure, a small incision is made in the neck or the groin, and a long, thin, flexible tube (catheter) is inserted into the incision and moved to the heart. Small devices (electrodes) on the tip of the catheter will send out electrical currents. A type of X-ray (fluoroscopy) will be used to help guide the catheter and to provide images of the heart. Tell a health care provider about:  Any allergies you have.  All medicines you are taking, including vitamins, herbs, eye drops, creams, and over-the-counter medicines.  Any problems you or family members have had with anesthetic medicines.  Any blood disorders you have.  Any surgeries you have had.  Any medical conditions you have, such as kidney failure.  Whether you are pregnant or may be pregnant. What are the risks? Generally, this is a safe procedure. However, problems may occur, including:  Infection.  Bruising and bleeding at the catheter insertion site.  Bleeding into the chest, especially into the sac that surrounds the heart. This is a serious complication.  Stroke or blood clots.  Damage to other structures or organs.  Allergic reaction to medicines or dyes.  Need for a permanent pacemaker if the normal electrical system is damaged. A pacemaker is a small computer that sends electrical signals to the heart and helps your heart beat normally.  The procedure not being fully effective. This may not be recognized until months later. Repeat ablation procedures are sometimes required.  What happens before  the procedure?  Follow instructions from your health care provider about eating or drinking restrictions.  Ask your health care provider about: ? Changing or stopping your regular medicines. This is especially important if you are taking diabetes medicines or blood  thinners. ? Taking medicines such as aspirin and ibuprofen. These medicines can thin your blood. Do not take these medicines before your procedure if your health care provider instructs you not to.  Plan to have someone take you home from the hospital or clinic.  If you will be going home right after the procedure, plan to have someone with you for 24 hours. What happens during the procedure?  To lower your risk of infection: ? Your health care team will wash or sanitize their hands. ? Your skin will be washed with soap. ? Hair may be removed from the incision area.  An IV tube will be inserted into one of your veins.  You will be given a medicine to help you relax (sedative).  The skin on your neck or groin will be numbed.  An incision will be made in your neck or your groin.  A needle will be inserted through the incision and into a large vein in your neck or groin.  A catheter will be inserted into the needle and moved to your heart.  Dye may be injected through the catheter to help your surgeon see the area of the heart that needs treatment.  Electrical currents will be sent from the catheter to ablate heart tissue in desired areas. There are three types of energy that may be used to ablate heart tissue: ? Heat (radiofrequency energy). ? Laser energy. ? Extreme cold (cryoablation).  When the necessary tissue has been ablated, the catheter will be removed.  Pressure will be held on the catheter insertion area to prevent excessive bleeding.  A bandage (dressing) will be placed over the catheter insertion area. The procedure may vary among health care providers and hospitals. What happens after the procedure?  Your blood pressure, heart rate, breathing rate, and blood oxygen level will be monitored until the medicines you were given have worn off.  Your catheter insertion area will be monitored for bleeding. You will need to lie still for a few hours to ensure that you do  not bleed from the catheter insertion area.  Do not drive for 24 hours or as long as directed by your health care provider. Summary  Cardiac ablation is a procedure to disable (ablate) a small amount of heart tissue in very specific places. Ablating some of the problem areas can improve the heart rhythm or return it to normal.  During the procedure, electrical currents will be sent from the catheter to ablate heart tissue in desired areas. This information is not intended to replace advice given to you by your health care provider. Make sure you discuss any questions you have with your health care provider. Document Released: 03/21/2009 Document Revised: 09/21/2016 Document Reviewed: 09/21/2016 Elsevier Interactive Patient Education  [employer].

## 2018-07-29 NOTE — Progress Notes (Signed)
HPI Veronica Hansen is referred today by Dr. Sharlett Iles for evaluation of atrial flutter. She is a pleasant 72 yo obese woman with HTN who was found to have symptomatic atrial flutter. She does not have palpitations but has sob with exertion. She denies chest pain or sob at rest. No syncope. She has been on rate control with her beta blocker. Her ECG in our atrial fib clinic showed typical atrial flutter with variable AV block. She has been on systemic anti-coagulation for almost a week. No Known Allergies   Current Outpatient Medications  Medication Sig Dispense Refill  . apixaban (ELIQUIS) 5 MG TABS tablet Take 5 mg by mouth 2 (two) times daily.    Marland Kitchen atenolol (TENORMIN) 50 MG tablet Take 50 mg by mouth daily.    . Biotin 1 MG CAPS Take 1 capsule by mouth daily.     . Calcium Carbonate-Vit D-Min (CALCIUM 1200 PO) Take 1 tablet by mouth daily.     . Cholecalciferol (VITAMIN D3 PO) Take 1 tablet by mouth daily.     Marland Kitchen lisinopril (PRINIVIL,ZESTRIL) 20 MG tablet Take 20 mg by mouth daily.    . Multiple Vitamin (MULTIVITAMIN) tablet Take 1 tablet by mouth daily.      . Multiple Vitamins-Minerals (PRESERVISION AREDS 2+MULTI VIT PO) Take 1 tablet by mouth 2 (two) times daily.     Marland Kitchen omeprazole (PRILOSEC) 20 MG capsule Take 1 capsule by mouth daily.    . simvastatin (ZOCOR) 80 MG tablet Take 80 mg by mouth at bedtime.       No current facility-administered medications for this visit.      Past Medical History:  Diagnosis Date  . Arthritis   . Diabetes mellitus    type 2  . Hyperlipidemia   . Hypertension   . Incontinence   . OSA (obstructive sleep apnea)   . OSA on CPAP 03/07/2014  . Runny nose    seasonal - poss allergies  . Sinus problem   . SOB (shortness of breath)     ROS:   All systems reviewed and negative except as noted in the HPI.   Past Surgical History:  Procedure Laterality Date  . dental implants  2011  . 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 TOTAL KNEE  ARTHROPLASTY  2007   right     Family History  Problem Relation Age of Onset  . Diabetes Father   . Diabetes Brother      Social History   Socioeconomic History  . Marital status: Married    Spouse name: Not on file  . Number of children: Not on file  . Years of education: Not on file  . Highest education level: Not on file  Occupational History  . Not on file  Social Needs  . Financial resource strain: Not on file  . Food insecurity:    Worry: Not on file    Inability: Not on file  . Transportation needs:    Medical: Not on file    Non-medical: Not on file  Tobacco Use  . Smoking status: Former Smoker    Last attempt to quit: 08/19/2005    Years since quitting: 12.9  . Smokeless tobacco: Never Used  Substance and Sexual Activity  . Alcohol use: No  . Drug use: No  . Sexual activity: Not on file  Lifestyle  . Physical activity:    Days per week: Not on file    Minutes per  session: Not on file  . Stress: Not on file  Relationships  . Social connections:    Talks on phone: Not on file    Gets together: Not on file    Attends religious service: Not on file    Active member of club or organization: Not on file    Attends meetings of clubs or organizations: Not on file    Relationship status: Not on file  . Intimate partner violence:    Fear of current or ex partner: Not on file    Emotionally abused: Not on file    Physically abused: Not on file    Forced sexual activity: Not on file  Other Topics Concern  . Not on file  Social History Narrative  . Not on file     BP 104/62   Pulse 92   Ht 5\' 8"  (1.727 m)   Wt 292 lb 6.4 oz (132.6 kg)   SpO2 94%   BMI 44.46 kg/m   Physical Exam:  Well appearing NAD HEENT: Unremarkable Neck:  6 cm JVD, no thyromegally Lymphatics:  No adenopathy Back:  No CVA tenderness Lungs:  Clear with no wheezes HEART:  Regular rate rhythm, no murmurs, no rubs, no clicks Abd:  soft, positive bowel sounds, no organomegally,  no rebound, no guarding Ext:  2 plus pulses, no edema, no cyanosis, no clubbing Skin:  No rashes no nodules Neuro:  CN II through XII intact, motor grossly intact  EKG - reviewed atrial flutter with a controlled VR   Assess/Plan: 1. Atrial flutter - I have reviewed the treatment options with the patient. The risks/benefits/goals/expectations have been discussed and she would like to proceed with catheter ablation of her atrial flutter. We will require she have at least 3 weeks of systemic anti-coagulation. 2. HTN - her blood pressure is controlled. She will continue her current meds. 3. Obesity - I discussed the importance of weight loss. She states that she will try to lose weight.   Mikle Bosworth.D.

## 2018-08-03 ENCOUNTER — Telehealth: Payer: Self-pay

## 2018-08-03 NOTE — Telephone Encounter (Signed)
Left a VM regarding the PREP at [facility] and for Veronica Hansen to call back

## 2018-08-10 ENCOUNTER — Ambulatory Visit (HOSPITAL_COMMUNITY)
Admission: RE | Admit: 2018-08-10 | Discharge: 2018-08-10 | Disposition: A | Discharge status: Home / Self Care | Payer: Medicare Other | Source: Ambulatory Visit | Attending: Nurse Practitioner | Admitting: Nurse Practitioner

## 2018-08-10 DIAGNOSIS — E785 Hyperlipidemia, unspecified: Secondary | ICD-10-CM | POA: Diagnosis not present

## 2018-08-10 DIAGNOSIS — R0600 Dyspnea, unspecified: Secondary | ICD-10-CM | POA: Insufficient documentation

## 2018-08-10 DIAGNOSIS — I4891 Unspecified atrial fibrillation: Secondary | ICD-10-CM | POA: Diagnosis not present

## 2018-08-10 DIAGNOSIS — I483 Typical atrial flutter: Secondary | ICD-10-CM | POA: Insufficient documentation

## 2018-08-10 DIAGNOSIS — I340 Nonrheumatic mitral (valve) insufficiency: Secondary | ICD-10-CM | POA: Diagnosis not present

## 2018-08-10 DIAGNOSIS — I10 Essential (primary) hypertension: Secondary | ICD-10-CM | POA: Diagnosis not present

## 2018-08-10 DIAGNOSIS — E119 Type 2 diabetes mellitus without complications: Secondary | ICD-10-CM | POA: Insufficient documentation

## 2018-08-10 NOTE — Progress Notes (Signed)
  Echocardiogram 2D Echocardiogram has been performed.  Jennette Dubin 08/10/2018, 3:47 PM

## 2018-08-18 NOTE — Anesthesia Preprocedure Evaluation (Addendum)
Anesthesia Evaluation  Patient identified by MRN, date of birth, ID band Patient awake    Reviewed: Allergy & Precautions, NPO status , Patient's Chart, lab work & pertinent test results, reviewed documented beta blocker date and time   Airway Mallampati: II  TM Distance: >3 FB Neck ROM: Full    Dental  (+) Upper Dentures, Partial Lower Non-removable implanted lower dentures:   Pulmonary neg pulmonary ROS, former smoker,    Pulmonary exam normal breath sounds clear to auscultation       Cardiovascular hypertension, Pt. on medications and Pt. on home beta blockers  Rhythm:Regular Rate:Tachycardia  Echo 08/09/18 - Left ventricle: The cavity size was mildly dilated. Systolic   function was normal. The estimated ejection fraction was in the range of 60% to 65%. Wall motion was normal; there were no regional wall motion abnormalities. The study was not technically sufficient to allow evaluation of LV diastolic dysfunction due to atrial flutter. - Mitral valve: There was mild regurgitation. - Left atrium: The atrium was mildly dilated. - Atrial septum: There was increased thickness of the septum, consistent with lipomatous hypertrophy. - Tricuspid valve: There was trivial regurgitation. - Pulmonic valve: There was trivial regurgitation.   Neuro/Psych negative neurological ROS  negative psych ROS   GI/Hepatic negative GI ROS, Neg liver ROS,   Endo/Other  diabetes, Type 2  Renal/GU negative Renal ROS     Musculoskeletal  (+) Arthritis ,   Abdominal (+) + obese,   Peds  Hematology negative hematology ROS (+)   Anesthesia Other Findings   Reproductive/Obstetrics negative OB ROS                        Anesthesia Physical Anesthesia Plan  ASA: III  Anesthesia Plan: General   Post-op Pain Management:    Induction: Intravenous  PONV Risk Score and Plan: 4 or greater and Ondansetron, Dexamethasone and  Treatment may vary due to age or medical condition  Airway Management Planned: Oral ETT  Additional Equipment:   Intra-op Plan:   Post-operative Plan: Extubation in OR  Informed Consent: I have reviewed the patients History and Physical, chart, labs and discussed the procedure including the risks, benefits and alternatives for the proposed anesthesia with the patient or authorized representative who has indicated his/her understanding and acceptance.   Dental advisory given  Plan Discussed with: CRNA  Anesthesia Plan Comments:        Anesthesia Quick Evaluation

## 2018-08-19 ENCOUNTER — Ambulatory Visit (HOSPITAL_COMMUNITY)
Admission: RE | Admit: 2018-08-19 | Discharge: 2018-08-19 | Disposition: A | Discharge status: Home / Self Care | Payer: Medicare Other | Source: Ambulatory Visit | Attending: Internal Medicine | Admitting: Internal Medicine

## 2018-08-19 ENCOUNTER — Ambulatory Visit (HOSPITAL_COMMUNITY): Payer: Medicare Other | Admitting: Certified Registered Nurse Anesthetist

## 2018-08-19 ENCOUNTER — Encounter (HOSPITAL_COMMUNITY)
Admission: RE | Disposition: A | Discharge status: Home / Self Care | Payer: Self-pay | Source: Ambulatory Visit | Attending: Internal Medicine

## 2018-08-19 DIAGNOSIS — E669 Obesity, unspecified: Secondary | ICD-10-CM | POA: Diagnosis not present

## 2018-08-19 DIAGNOSIS — M1990 Unspecified osteoarthritis, unspecified site: Secondary | ICD-10-CM | POA: Insufficient documentation

## 2018-08-19 DIAGNOSIS — I4892 Unspecified atrial flutter: Secondary | ICD-10-CM | POA: Diagnosis present

## 2018-08-19 DIAGNOSIS — E119 Type 2 diabetes mellitus without complications: Secondary | ICD-10-CM | POA: Insufficient documentation

## 2018-08-19 DIAGNOSIS — Z6841 Body Mass Index (BMI) 40.0 and over, adult: Secondary | ICD-10-CM | POA: Insufficient documentation

## 2018-08-19 DIAGNOSIS — Z87891 Personal history of nicotine dependence: Secondary | ICD-10-CM | POA: Diagnosis not present

## 2018-08-19 DIAGNOSIS — I483 Typical atrial flutter: Secondary | ICD-10-CM | POA: Insufficient documentation

## 2018-08-19 DIAGNOSIS — E785 Hyperlipidemia, unspecified: Secondary | ICD-10-CM | POA: Insufficient documentation

## 2018-08-19 DIAGNOSIS — I10 Essential (primary) hypertension: Secondary | ICD-10-CM | POA: Insufficient documentation

## 2018-08-19 DIAGNOSIS — G4733 Obstructive sleep apnea (adult) (pediatric): Secondary | ICD-10-CM | POA: Diagnosis not present

## 2018-08-19 DIAGNOSIS — Z7901 Long term (current) use of anticoagulants: Secondary | ICD-10-CM | POA: Diagnosis not present

## 2018-08-19 HISTORY — PX: A-FLUTTER ABLATION: EP1230

## 2018-08-19 LAB — GLUCOSE, CAPILLARY
Glucose-Capillary: 104 mg/dL — ABNORMAL HIGH (ref 70–99)
Glucose-Capillary: 112 mg/dL — ABNORMAL HIGH (ref 70–99)

## 2018-08-19 SURGERY — A-FLUTTER ABLATION
Anesthesia: General

## 2018-08-19 MED ORDER — LIDOCAINE 2% (20 MG/ML) 5 ML SYRINGE
INTRAMUSCULAR | Status: DC | PRN
  Administered 2018-08-19: 100 mg via INTRAVENOUS

## 2018-08-19 MED ORDER — PROPOFOL 10 MG/ML IV BOLUS
INTRAVENOUS | Status: DC | PRN
  Administered 2018-08-19: 150 mg via INTRAVENOUS
  Administered 2018-08-19: 50 mg via INTRAVENOUS

## 2018-08-19 MED ORDER — HEPARIN (PORCINE) IN NACL 1000-0.9 UT/500ML-% IV SOLN
INTRAVENOUS | Status: DC | PRN
  Administered 2018-08-19: 500 mL

## 2018-08-19 MED ORDER — SODIUM CHLORIDE 0.9 % IV SOLN: 250.0000 mL | INTRAVENOUS | Status: DC | PRN

## 2018-08-19 MED ORDER — ONDANSETRON HCL 4 MG/2ML IJ SOLN: 4.0000 mg | Freq: Four times a day (QID) | INTRAMUSCULAR | Status: DC | PRN

## 2018-08-19 MED ORDER — SODIUM CHLORIDE 0.9% FLUSH: 3.0000 mL | Freq: Two times a day (BID) | INTRAVENOUS | Status: DC

## 2018-08-19 MED ORDER — BUPIVACAINE HCL (PF) 0.25 % IJ SOLN
INTRAMUSCULAR | Status: DC | PRN
  Administered 2018-08-19: 60 mL

## 2018-08-19 MED ORDER — HEPARIN SODIUM (PORCINE) 1000 UNIT/ML IJ SOLN
INTRAMUSCULAR | Status: AC
  Filled 2018-08-19: qty 1

## 2018-08-19 MED ORDER — SUCCINYLCHOLINE CHLORIDE 20 MG/ML IJ SOLN
INTRAMUSCULAR | Status: DC | PRN
  Administered 2018-08-19: 140 mg via INTRAVENOUS

## 2018-08-19 MED ORDER — HEPARIN (PORCINE) IN NACL 1000-0.9 UT/500ML-% IV SOLN
INTRAVENOUS | Status: AC
  Filled 2018-08-19: qty 500

## 2018-08-19 MED ORDER — BUPIVACAINE HCL (PF) 0.25 % IJ SOLN
INTRAMUSCULAR | Status: AC
  Filled 2018-08-19: qty 60

## 2018-08-19 MED ORDER — SUGAMMADEX SODIUM 200 MG/2ML IV SOLN
INTRAVENOUS | Status: DC | PRN
  Administered 2018-08-19 (×2): 100 mg via INTRAVENOUS

## 2018-08-19 MED ORDER — ROCURONIUM BROMIDE 50 MG/5ML IV SOSY
PREFILLED_SYRINGE | INTRAVENOUS | Status: DC | PRN
  Administered 2018-08-19: 50 mg via INTRAVENOUS
  Administered 2018-08-19: 20 mg via INTRAVENOUS

## 2018-08-19 MED ORDER — SODIUM CHLORIDE 0.9% FLUSH: 3.0000 mL | INTRAVENOUS | Status: DC | PRN

## 2018-08-19 MED ORDER — FENTANYL CITRATE (PF) 100 MCG/2ML IJ SOLN
INTRAMUSCULAR | Status: DC | PRN
  Administered 2018-08-19: 50 ug via INTRAVENOUS

## 2018-08-19 MED ORDER — ACETAMINOPHEN 325 MG PO TABS
650.0000 mg | ORAL_TABLET | ORAL | Status: DC | PRN
  Filled 2018-08-19: qty 2

## 2018-08-19 MED ORDER — DEXAMETHASONE SODIUM PHOSPHATE 10 MG/ML IJ SOLN
INTRAMUSCULAR | Status: DC | PRN
  Administered 2018-08-19: 10 mg via INTRAVENOUS

## 2018-08-19 MED ORDER — PHENYLEPHRINE 40 MCG/ML (10ML) SYRINGE FOR IV PUSH (FOR BLOOD PRESSURE SUPPORT)
PREFILLED_SYRINGE | INTRAVENOUS | Status: DC | PRN
  Administered 2018-08-19 (×2): 200 ug via INTRAVENOUS

## 2018-08-19 MED ORDER — ONDANSETRON HCL 4 MG/2ML IJ SOLN
INTRAMUSCULAR | Status: DC | PRN
  Administered 2018-08-19: 4 mg via INTRAVENOUS

## 2018-08-19 MED ORDER — SODIUM CHLORIDE 0.9 % IV SOLN
INTRAVENOUS | Status: DC | PRN
  Administered 2018-08-19 (×2): 60 ug/min via INTRAVENOUS

## 2018-08-19 MED ORDER — SODIUM CHLORIDE 0.9 % IV SOLN
INTRAVENOUS | Status: DC
  Administered 2018-08-19: 06:00:00 via INTRAVENOUS

## 2018-08-19 SURGICAL SUPPLY — 14 items
BAG SNAP BAND KOVER 36X36 (MISCELLANEOUS) ×2 IMPLANT
BLANKET WARM UNDERBOD FULL ACC (MISCELLANEOUS) ×2 IMPLANT
CATH JOSEPH QUAD ALLRED 6F REP (CATHETERS) ×2 IMPLANT
CATH POLARIS X REPROCESSED (CATHETERS) ×2 IMPLANT
CATH SMTCH THERMOCOOL SF DF (CATHETERS) ×2 IMPLANT
PACK EP LATEX FREE (CUSTOM PROCEDURE TRAY) ×1
PACK EP LF (CUSTOM PROCEDURE TRAY) ×1 IMPLANT
PAD PRO RADIOLUCENT 2001M-C (PAD) ×4 IMPLANT
PATCH CARTO3 (PAD) ×2 IMPLANT
SHEATH PINNACLE 6F 10CM (SHEATH) ×2 IMPLANT
SHEATH PINNACLE 7F 10CM (SHEATH) ×2 IMPLANT
SHEATH PINNACLE 8F 10CM (SHEATH) ×2 IMPLANT
SHIELD RADPAD SCOOP 12X17 (MISCELLANEOUS) ×2 IMPLANT
TUBING SMART ABLATE COOLFLOW (TUBING) ×2 IMPLANT

## 2018-08-19 NOTE — Anesthesia Procedure Notes (Signed)
Procedure Name: Intubation Date/Time: 08/19/2018 7:35 AM Performed by: Trinna Post., CRNA Pre-anesthesia Checklist: Patient identified, Emergency Drugs available, Suction available, Patient being monitored and Timeout performed Patient Re-evaluated:Patient Re-evaluated prior to induction Oxygen Delivery Method: Circle system utilized Preoxygenation: Pre-oxygenation with 100% oxygen Induction Type: IV induction Ventilation: Oral airway inserted - appropriate to patient size and Mask ventilation without difficulty Laryngoscope Size: Glidescope and 3 Grade View: Grade I Tube type: Oral Tube size: 7.0 mm Number of attempts: 1 Airway Equipment and Method: Stylet Placement Confirmation: ETT inserted through vocal cords under direct vision,  positive ETCO2 and breath sounds checked- equal and bilateral Secured at: 22 cm Tube secured with: Tape Dental Injury: Teeth and Oropharynx as per pre-operative assessment

## 2018-08-19 NOTE — Anesthesia Postprocedure Evaluation (Signed)
Anesthesia Post Note  Patient: Veronica Hansen  Procedure(s) Performed: A-FLUTTER ABLATION (N/A )     Patient location during evaluation: PACU Anesthesia Type: General Level of consciousness: sedated and patient cooperative Pain management: pain level controlled Vital Signs Assessment: post-procedure vital signs reviewed and stable Respiratory status: spontaneous breathing Cardiovascular status: stable Anesthetic complications: no    Last Vitals:  Vitals:   08/19/18 1005 08/19/18 1010  BP: 110/69 119/62  Pulse: 94 94  Resp: 11 11  Temp:    SpO2: 95% 95%    Last Pain:  Vitals:   08/19/18 1003  TempSrc: Temporal  PainSc:                  Nolon Nations

## 2018-08-19 NOTE — Interval H&P Note (Signed)
History and Physical Interval Note:  08/19/2018 7:53 AM  Veronica Hansen  has presented today for surgery, with the diagnosis of aflutter  The various methods of treatment have been discussed with the patient and family. After consideration of risks, benefits and other options for treatment, the patient has consented to  Procedure(s): A-FLUTTER ABLATION (N/A) as a surgical intervention .  The patient's history has been reviewed, patient examined, no change in status, stable for surgery.  I have reviewed the patient's chart and labs.  Questions were answered to the patient's satisfaction.     Cristopher Peru

## 2018-08-19 NOTE — Discharge Instructions (Signed)
Post procedure care instructions No driving for 4 days. No lifting over 5 lbs for 1 week. No vigorous or sexual activity for 1 week. You may return to work on 08/26/18. Keep procedure site clean & dry. If you notice increased pain, swelling, bleeding or pus, call/return!  You may shower, but no soaking baths/hot tubs/pools for 1 week.     Cardiac Ablation, Care After This sheet gives you information about how to care for yourself after your procedure. Your health care provider may also give you more specific instructions. If you have problems or questions, contact your health care provider. What can I expect after the procedure? After the procedure, it is common to have:  Bruising around your puncture site.  Tenderness around your puncture site.  Skipped heartbeats.  Tiredness (fatigue).  Follow these instructions at home: Puncture site care  Follow instructions from your health care provider about how to take care of your puncture site. Make sure you: ? Wash your hands with soap and water before you remove your bandage (dressing). If soap and water are not available, use hand sanitizer. ? Dressing can be removed in 24-48 hours. ? Once dressing is removed, you may shower.  Check your puncture site every day for signs of infection. Check for: ? Redness, swelling, or pain. ? Fluid or blood. If your puncture site starts to bleed, lie down on your back, apply firm pressure to the area, and contact your health care provider. ? Warmth. ? Pus or a bad smell. Driving  Ask your health care provider when it is safe for you to drive again after the procedure.  Do not drive or use heavy machinery while taking prescription pain medicine.  Do not drive for 24 hours if you were given a medicine to help you relax (sedative) during your procedure. Activity  Avoid activities that take a lot of effort for at least 3 days after your procedure.  Do not lift anything that is heavier than 5 lb  (4.5 kg), or the limit that you are told, until your health care provider says that it is safe. (1-2 weeks)  Return to your normal activities as told by your health care provider. Ask your health care provider what activities are safe for you. General instructions  Take over-the-counter and prescription medicines only as told by your health care provider.  Do not use any products that contain nicotine or tobacco, such as cigarettes and e-cigarettes. If you need help quitting, ask your health care provider.  Do not take baths, swim, or use a hot tub until your health care provider approves.  Do not drink alcohol for 24 hours after your procedure.  Keep all follow-up visits as told by your health care provider. This is important. Contact a health care provider if:  You have redness, mild swelling, or pain around your puncture site.  You have fluid or blood coming from your puncture site that stops after applying firm pressure to the area.  Your puncture site feels warm to the touch.  You have pus or a bad smell coming from your puncture site.  You have a fever.  You have chest pain or discomfort that spreads to your neck, jaw, or arm.  You are sweating a lot.  You feel nauseous.  You have a fast or irregular heartbeat.  You have shortness of breath.  You are dizzy or light-headed and feel the need to lie down.  You have pain or numbness in the arm or  leg closest to your puncture site. Get help right away if:  Your puncture site suddenly swells.  Your puncture site is bleeding and the bleeding does not stop after applying firm pressure to the area. These symptoms may represent a serious problem that is an emergency. Do not wait to see if the symptoms will go away. Get medical help right away. Call your local emergency services (911 in the [place] Do not drive yourself to the hospital. Summary  After the procedure, it is normal to have bruising and tenderness at the puncture  site in your groin, neck, or forearm.  Check your puncture site every day for signs of infection.  Get help right away if your puncture site is bleeding and the bleeding does not stop after applying firm pressure to the area. This is a medical emergency. This information is not intended to replace advice given to you by your health care provider. Make sure you discuss any questions you have with your health care provider. Document Released: 02/11/2017 Document Revised: 02/11/2017 Document Reviewed: 02/11/2017 Elsevier Interactive Patient Education  2018 [employer].

## 2018-08-19 NOTE — Addendum Note (Signed)
Addendum  created 08/19/18 1551 by Leonor Liv, CRNA   Charge Capture section accepted

## 2018-08-19 NOTE — Progress Notes (Signed)
Site area: right groin fv sheaths x3 Site Prior to Removal:  Level 0 Pressure Applied For: 20 minutes Manual:   yes Patient Status During Pull:  stable Post Pull Site:  Level 0 Post Pull Instructions Given:  yes Post Pull Pulses Present: rt dp palpable Dressing Applied:  Gauze and tegaderm Bedrest begins @ 1010 Comments:  IV saline locked

## 2018-08-19 NOTE — Interval H&P Note (Signed)
History and Physical Interval Note:  08/19/2018 7:54 AM  Veronica Hansen  has presented today for surgery, with the diagnosis of aflutter  The various methods of treatment have been discussed with the patient and family. After consideration of risks, benefits and other options for treatment, the patient has consented to  Procedure(s): A-FLUTTER ABLATION (N/A) as a surgical intervention .  The patient's history has been reviewed, patient examined, no change in status, stable for surgery.  I have reviewed the patient's chart and labs.  Questions were answered to the patient's satisfaction.     Cristopher Peru

## 2018-08-19 NOTE — Transfer of Care (Signed)
Immediate Anesthesia Transfer of Care Note  Patient: Veronica Hansen  Procedure(s) Performed: A-FLUTTER ABLATION (N/A )  Patient Location: PACU and Cath Lab  Anesthesia Type:General  Level of Consciousness: awake, alert  and oriented  Airway & Oxygen Therapy: Patient Spontanous Breathing and Patient connected to face mask oxygen  Post-op Assessment: Report given to RN and Post -op Vital signs reviewed and stable  Post vital signs: Reviewed and stable  Last Vitals:  Vitals Value Taken Time  BP 116/61 08/19/2018  9:38 AM  Temp    Pulse 94 08/19/2018  9:38 AM  Resp 14 08/19/2018  9:38 AM  SpO2 98 % 08/19/2018  9:38 AM  Vitals shown include unvalidated device data.  Last Pain:  Vitals:   08/19/18 0545  TempSrc: Oral      Patients Stated Pain Goal: 5 (50/56/78 8933)  Complications: No apparent anesthesia complications

## 2018-08-22 ENCOUNTER — Encounter (HOSPITAL_COMMUNITY): Payer: Self-pay | Admitting: Internal Medicine

## 2018-08-23 ENCOUNTER — Other Ambulatory Visit: Payer: Self-pay | Admitting: Internal Medicine

## 2018-08-23 DIAGNOSIS — Z1231 Encounter for screening mammogram for malignant neoplasm of breast: Secondary | ICD-10-CM

## 2018-08-23 MED FILL — Heparin Sodium (Porcine) Inj 1000 Unit/ML: INTRAMUSCULAR | Qty: 30 | Status: AC

## 2018-09-07 NOTE — Progress Notes (Signed)
[facility] PREP Weekly Session   Patient Details  Name: Veronica Hansen MRN: [id number] Date of Birth: 1946-02-16 Age: 72 y.o. PCP: Leanna Battles, MD  Vitals:   09/06/18 1509  Weight: 293 lb 8 oz (133.1 kg)    [facility] Weekly seesion - 09/07/18 1500      Weekly Session   Topic Discussed  Health habits;Water    Minutes exercised this week  200 minutes   120strength, 50yogs, 25strength     Fun things you did since last meeting:"went to day trip to [place]" Things you are grateful for:"[place]autiful [place] scenery.  Walked around [place], [place]" Nutrition celebrations:"didn't buy halloween candy" Barriers:"eating at night/snacking"  [name] 09/07/2018, 3:16 PM

## 2018-09-08 NOTE — Progress Notes (Signed)
[facility] PREP Weekly Session   Patient Details  Name: Veronica Hansen MRN: [id number] Date of Birth: [birth year] Age: 72 y.o. PCP: Leanna Battles, MD  Vitals:   08/29/18 1320  BP: 118/78  Pulse: 64  Resp: 20  SpO2: 96%  Weight: 293 lb 6.4 oz (133.1 kg)     [name] has started her 12 week PREP class at the [facility] which takes place on Tues & Thurs from 11:30-1:00.   [name] 09/08/2018, 10:16 AM

## 2018-09-16 ENCOUNTER — Ambulatory Visit: Payer: Medicare Other | Admitting: Internal Medicine

## 2018-09-16 ENCOUNTER — Encounter: Payer: Self-pay | Admitting: Internal Medicine

## 2018-09-16 ENCOUNTER — Encounter (INDEPENDENT_AMBULATORY_CARE_PROVIDER_SITE_OTHER): Payer: Self-pay

## 2018-09-16 VITALS — BP 130/72 | HR 69 | Ht 68.0 in | Wt 293.0 lb

## 2018-09-16 DIAGNOSIS — I483 Typical atrial flutter: Secondary | ICD-10-CM

## 2018-09-16 NOTE — Addendum Note (Signed)
Addended by: Marlis Edelson C on: 09/16/2018 03:39 PM   Modules accepted: Orders

## 2018-09-16 NOTE — Patient Instructions (Addendum)
Medication Instructions:  Your physician has recommended you make the following change in your medication:  1.  Check your BP daily.  A.  If your systolic BP ( the top number) is higher than 130 take an extra 1/2 tablet of your atenolol  B.  If your systolic BP is higher than 140 take an extra 1/2 tablet of your atenolol AND extra 1/2 tablet of your lisinopril  2.  Stop Eliquis-Pt notified verbally   If you need a refill on your cardiac medications before your next appointment, please call your pharmacy.   Lab work: None ordered.  If you have labs (blood work) drawn today and your tests are completely normal, you will receive your results only by: Marland Kitchen MyChart Message (if you have MyChart) OR . A paper copy in the mail If you have any lab test that is abnormal or we need to change your treatment, we will call you to review the results.  Testing/Procedures: None ordered.  Follow-Up:  Your physician wants you to follow-up in: as needed with Dr. Lovena Le.       At [facility], you and your health needs are our priority.  As part of our continuing mission to provide you with exceptional heart care, we have created designated Provider Care Teams.  These Care Teams include your primary Cardiologist (physician) and Advanced Practice Providers (APPs -  Physician Assistants and Nurse Practitioners) who all work together to provide you with the care you need, when you need it. Dennis Bast may see Cristopher Peru, MD or one of the following Advanced Practice Providers on your designated Care Team:   . Chanetta Marshall, NP . Tommye Standard, PA-C   Any Other Special Instructions Will Be Listed Below (If Applicable).

## 2018-09-16 NOTE — Progress Notes (Signed)
HPI Veronica Hansen returns today after undergoing EP study and catheter ablation of atrial flutter several months ago. She has done well in the interim. No palpitations. She is concerned because her BP has been up since her ablation.  No Known Allergies   Current Outpatient Medications  Medication Sig Dispense Refill  . apixaban (ELIQUIS) 5 MG TABS tablet Take 5 mg by mouth 2 (two) times daily.    Marland Kitchen atenolol (TENORMIN) 50 MG tablet Take 50 mg by mouth daily.    . Biotin 1 MG CAPS Take 1 mg by mouth daily.     . IRON PO Take 1 tablet by mouth daily.    Marland Kitchen lisinopril (PRINIVIL,ZESTRIL) 20 MG tablet Take 20 mg by mouth daily.    . Multiple Vitamin (MULTIVITAMIN) tablet Take 1 tablet by mouth daily.      . Multiple Vitamins-Minerals (PRESERVISION AREDS 2+MULTI VIT PO) Take 1 tablet by mouth 2 (two) times daily.     Marland Kitchen omeprazole (PRILOSEC) 20 MG capsule Take 20 mg by mouth daily as needed (heartburn).     . simvastatin (ZOCOR) 80 MG tablet Take 80 mg by mouth at bedtime.       No current facility-administered medications for this visit.      Past Medical History:  Diagnosis Date  . Arthritis   . Diabetes mellitus    type 2  . Hyperlipidemia   . Hypertension   . Incontinence   . OSA (obstructive sleep apnea)   . OSA on CPAP 03/07/2014  . Runny nose    seasonal - poss allergies  . Sinus problem   . SOB (shortness of breath)     ROS:   All systems reviewed and negative except as noted in the HPI.   Past Surgical History:  Procedure Laterality Date  . A-FLUTTER ABLATION N/A 08/19/2018   Procedure: A-FLUTTER ABLATION;  Surgeon: Evans Lance, MD;  Location: [facility];  Service: Cardiovascular;  Laterality: N/A;  . dental implants  2011  . 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 TOTAL KNEE ARTHROPLASTY  2007   right     Family History  Problem Relation Age of Onset  . Diabetes Father   . Diabetes Brother      Social History   Socioeconomic History    . Marital status: Married    Spouse name: Not on file  . Number of children: Not on file  . Years of education: Not on file  . Highest education level: Not on file  Occupational History  . Not on file  Social Needs  . Financial resource strain: Not on file  . Food insecurity:    Worry: Not on file    Inability: Not on file  . Transportation needs:    Medical: Not on file    Non-medical: Not on file  Tobacco Use  . Smoking status: Former Smoker    Last attempt to quit: 08/19/2005    Years since quitting: 13.0  . Smokeless tobacco: Never Used  Substance and Sexual Activity  . Alcohol use: No  . Drug use: No  . Sexual activity: Not on file  Lifestyle  . Physical activity:    Days per week: Not on file    Minutes per session: Not on file  . Stress: Not on file  Relationships  . Social connections:    Talks on phone: Not on file    Gets together: Not on file  Attends religious service: Not on file    Active member of club or organization: Not on file    Attends meetings of clubs or organizations: Not on file    Relationship status: Not on file  . Intimate partner violence:    Fear of current or ex partner: Not on file    Emotionally abused: Not on file    Physically abused: Not on file    Forced sexual activity: Not on file  Other Topics Concern  . Not on file  Social History Narrative  . Not on file     BP 130/72   Pulse 69   Ht 5\' 8"  (1.727 m)   Wt 293 lb (132.9 kg)   SpO2 97%   BMI 44.55 kg/m   Physical Exam:  Well appearing NAD HEENT: Unremarkable Neck:  No JVD, no thyromegally Lymphatics:  No adenopathy Back:  No CVA tenderness Lungs:  Clear with no wheezes HEART:  Regular rate rhythm, no murmurs, no rubs, no clicks Abd:  soft, positive bowel sounds, no organomegally, no rebound, no guarding Ext:  2 plus pulses, no edema, no cyanosis, no clubbing Skin:  No rashes no nodules Neuro:  CN II through XII intact, motor grossly intact  EKG -  nsr   Assess/Plan: 1. Atrial flutter - she is doing well and back in NSR. I have asked her to stop her eliquis. 2. HTN - her pressures have been up a bit. I have asked her to increase the dose of her atenolol and lisinopril as directed 3. Obesity - she is exercising regularly but has not lost weight. I have asked her to try and increase her exercise duration.  Mikle Bosworth.D.

## 2018-09-16 NOTE — Progress Notes (Signed)
[facility] PREP Weekly Session   Patient Details  Name: Veronica Hansen MRN: [id number] Date of Birth: [birth year] Age: 72 y.o. PCP: Leanna Battles, MD  Vitals:   09/13/18 1103  Weight: 291 lb 6.4 oz (132.2 kg)    [facility] Weekly seesion - 09/16/18 1100      Weekly Session   Topic Discussed  Other ways to be active    Minutes exercised this week  300 minutes   120zumba/60strength/120yoga     Fun things you did since last meeting:"went to a Halloween party" Things you are grateful for:"I was able to help set-up for party: helped the older members" Nutrition celebrations:"stopped eating pretzel rods, had more fruit instead" Barriers:"stop nibbling-cookies"  [name] 09/16/2018, 11:04 AM

## 2018-09-20 NOTE — Progress Notes (Signed)
[facility] PREP Weekly Session   Patient Details  Name: Veronica Hansen MRN: [id number] Date of Birth: [birth year] Age: 72 y.o. PCP: Leanna Battles, MD  Vitals:   09/20/18 1936  Weight: 294 lb (133.4 kg)    [facility] Weekly seesion - 09/20/18 1900      Weekly Session   Topic Discussed  Health habits;Water    Minutes exercised this week  460 minutes    Classes attended to date  7      Fun things you did since last meeting:"Relaced and didn't feel guilty" Things you are grateful for:" this program for giving me "a plan" and direction" Nutrition celebrations:"healthy hefty veggie soup that tasted good!!" Barriers:"getting my weight off"   [name] 09/20/2018, 7:37 PM

## 2018-09-22 ENCOUNTER — Ambulatory Visit
Admission: RE | Admit: 2018-09-22 | Discharge: 2018-09-22 | Disposition: A | Discharge status: Home / Self Care | Payer: Medicare Other | Source: Ambulatory Visit | Attending: Internal Medicine | Admitting: Internal Medicine

## 2018-09-22 DIAGNOSIS — Z1231 Encounter for screening mammogram for malignant neoplasm of breast: Secondary | ICD-10-CM

## 2018-09-27 ENCOUNTER — Other Ambulatory Visit: Payer: Self-pay | Admitting: Internal Medicine

## 2018-09-27 DIAGNOSIS — R928 Other abnormal and inconclusive findings on diagnostic imaging of breast: Secondary | ICD-10-CM

## 2018-09-30 ENCOUNTER — Other Ambulatory Visit: Payer: Medicare Other

## 2018-09-30 NOTE — Progress Notes (Signed)
Fun things you did since last meeting:"Christmas Shopping" Things you are grateful for:"added more water & spend less time sitting" Nutrition celebrations:"hold off bread on my plate.  Waited till meal came" Barriers:"sweets, still crave"

## 2018-10-05 NOTE — Progress Notes (Signed)
[facility] PREP Weekly Session   Patient Details  Name: Veronica Hansen MRN: [id number] Date of Birth: Sep 24, 1946 Age: 72 y.o. PCP: Leanna Battles, MD  Vitals:   10/04/18 1459  Weight: 293 lb (132.9 kg)    [facility] Weekly seesion - 10/05/18 1500      Weekly Session   Topic Discussed  Stress management and problem solving    Minutes exercised this week  540 minutes   180cardio/140strength/111flexibility     Fun things you did since last meeting:"saw Christmas show & lots of social activities" Things you are grateful for:"this group, gets me back in focus!!" Nutrition celebrations:"ate breakfast every day" Barriers:"Stress, getting things done"  [name] 10/05/2018, 3:00 PM

## 2018-10-17 ENCOUNTER — Other Ambulatory Visit: Payer: Self-pay | Admitting: Internal Medicine

## 2018-10-17 ENCOUNTER — Ambulatory Visit
Admission: RE | Admit: 2018-10-17 | Discharge: 2018-10-17 | Disposition: A | Discharge status: Home / Self Care | Payer: Medicare Other | Source: Ambulatory Visit | Attending: Internal Medicine | Admitting: Internal Medicine

## 2018-10-17 DIAGNOSIS — R599 Enlarged lymph nodes, unspecified: Secondary | ICD-10-CM

## 2018-10-17 DIAGNOSIS — N6320 Unspecified lump in the left breast, unspecified quadrant: Secondary | ICD-10-CM

## 2018-10-17 DIAGNOSIS — R928 Other abnormal and inconclusive findings on diagnostic imaging of breast: Secondary | ICD-10-CM

## 2018-10-19 NOTE — Progress Notes (Signed)
[facility] PREP Weekly Session   Patient Details  Name: Veronica Hansen MRN: [id number] Date of Birth: December 10, 1945 Age: 72 y.o. PCP: Leanna Battles, MD  Vitals:   10/18/18 1455  Weight: 293 lb (132.9 kg)    [facility] Weekly seesion - 10/19/18 1400      Weekly Session   Topic Discussed  Importance of resistance training    Minutes exercised this week  200 minutes   80cardio/60strength/96flexibility     Fun things you did since last meeting:"[place] w/grandkids" Things you are grateful for:"Cleaned up my clutter = accomplished 1 closet!" Nutrition celebrations:"more veggies" Barriers:"picking at night"  [name] 10/19/2018, 2:56 PM

## 2018-10-20 ENCOUNTER — Other Ambulatory Visit: Payer: Self-pay | Admitting: Internal Medicine

## 2018-10-20 ENCOUNTER — Ambulatory Visit
Admission: RE | Admit: 2018-10-20 | Discharge: 2018-10-20 | Disposition: A | Discharge status: Home / Self Care | Payer: Medicare Other | Source: Ambulatory Visit | Attending: Internal Medicine | Admitting: Internal Medicine

## 2018-10-20 DIAGNOSIS — R599 Enlarged lymph nodes, unspecified: Secondary | ICD-10-CM

## 2018-10-20 DIAGNOSIS — N6320 Unspecified lump in the left breast, unspecified quadrant: Secondary | ICD-10-CM

## 2019-01-11 HISTORY — PX: OTHER SURGICAL HISTORY: SHX169

## 2019-12-21 ENCOUNTER — Other Ambulatory Visit: Payer: Medicare Other

## 2019-12-25 ENCOUNTER — Ambulatory Visit: Payer: Medicare Other | Attending: Internal Medicine

## 2019-12-25 DIAGNOSIS — Z20822 Contact with and (suspected) exposure to covid-19: Secondary | ICD-10-CM

## 2019-12-26 LAB — NOVEL CORONAVIRUS, NAA: SARS-CoV-2, NAA: NOT DETECTED

## 2020-04-24 ENCOUNTER — Ambulatory Visit: Payer: Medicare Other | Attending: Internal Medicine

## 2020-04-24 DIAGNOSIS — Z20822 Contact with and (suspected) exposure to covid-19: Secondary | ICD-10-CM

## 2020-04-25 LAB — NOVEL CORONAVIRUS, NAA: SARS-CoV-2, NAA: NOT DETECTED

## 2020-04-25 LAB — SARS-COV-2, NAA 2 DAY TAT

## 2020-05-16 IMAGING — MG DIGITAL SCREENING BILATERAL MAMMOGRAM WITH TOMO AND CAD
8 of 14 series · 8 of 40 positions shown · non-contrast
Comparison: Previous exam(s).

CLINICAL DATA: Screening.

EXAM:
DIGITAL SCREENING BILATERAL MAMMOGRAM WITH TOMO AND CAD

[L CC synth-2D]
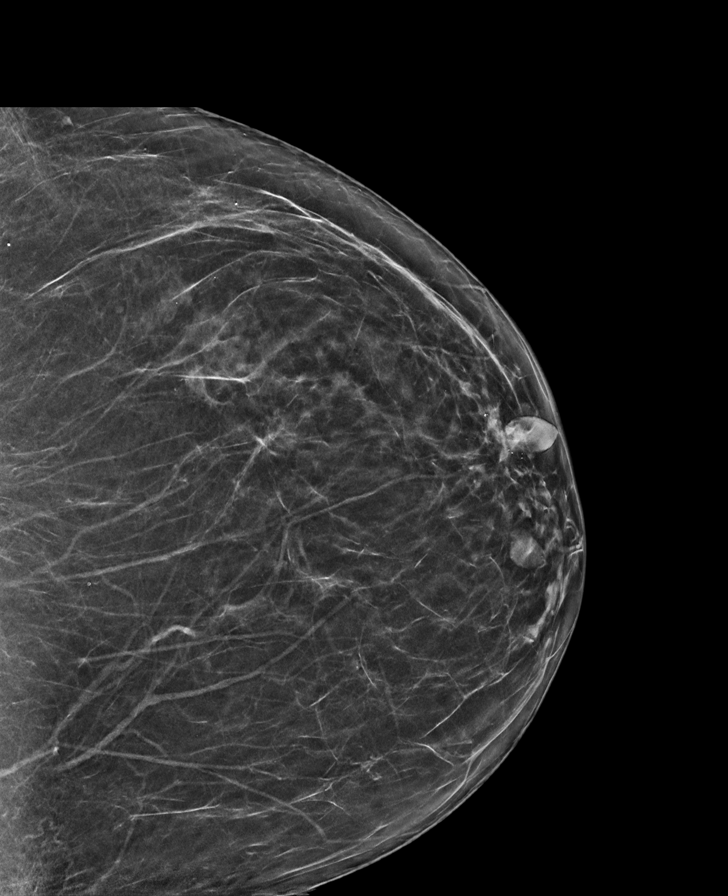

[L MLO synth-2D (1 of 2)]
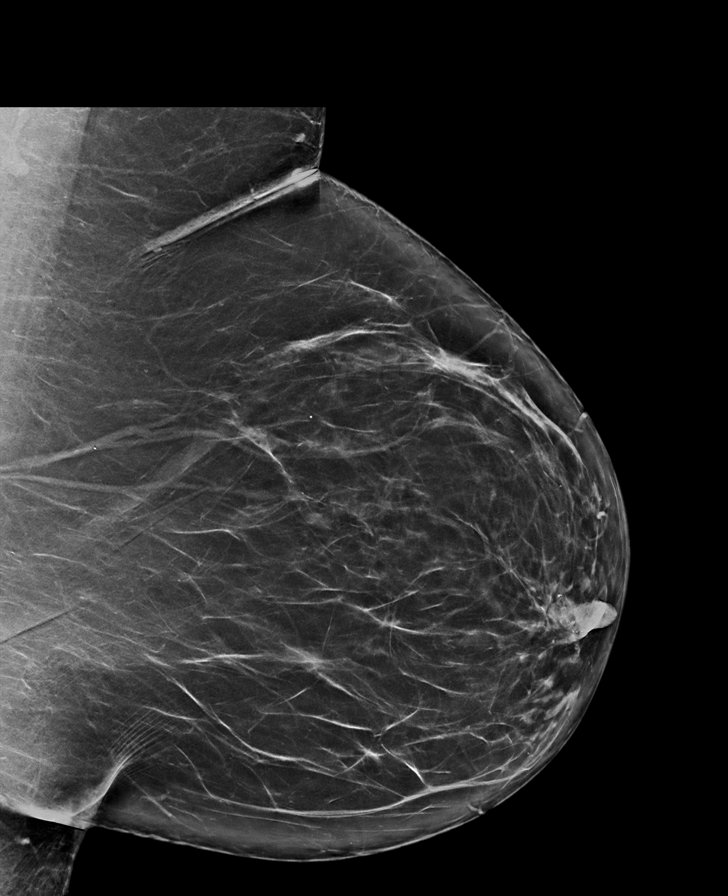

[R MLO synth-2D (1 of 2)]
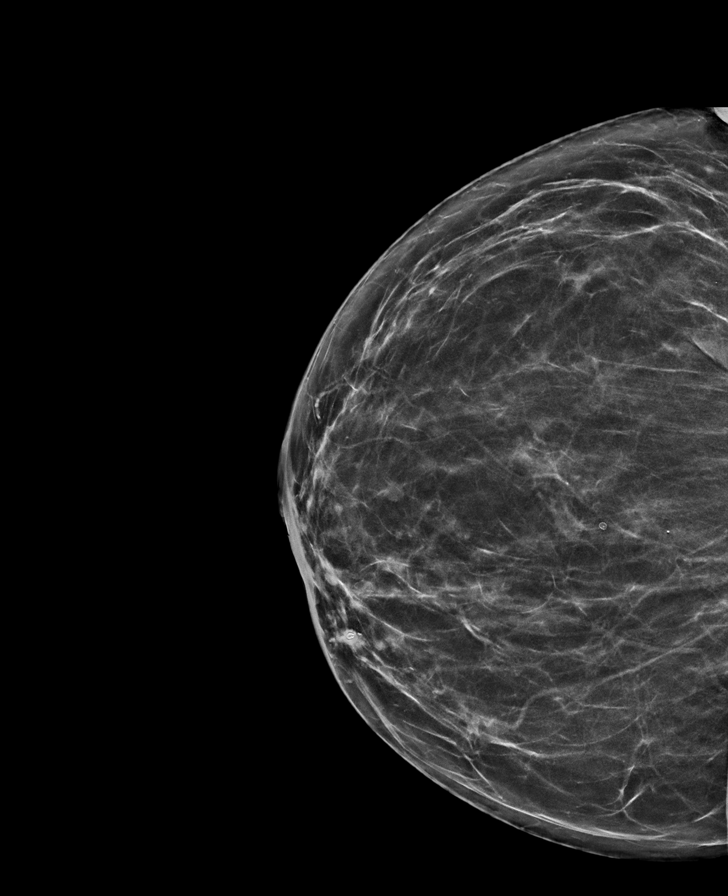

[R MLO synth-2D (2 of 2)]
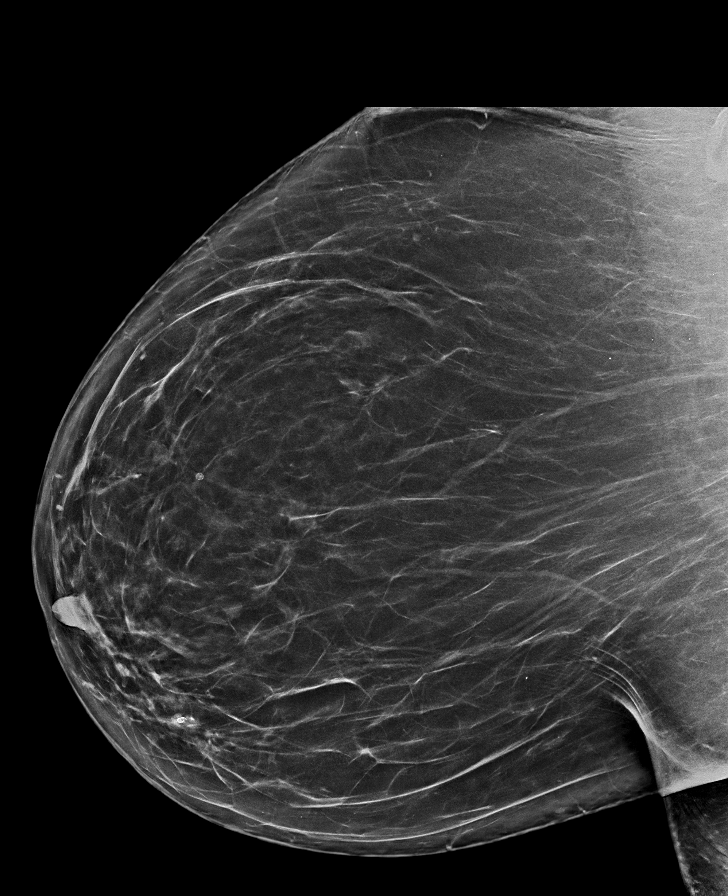

[R CC synth-2D]
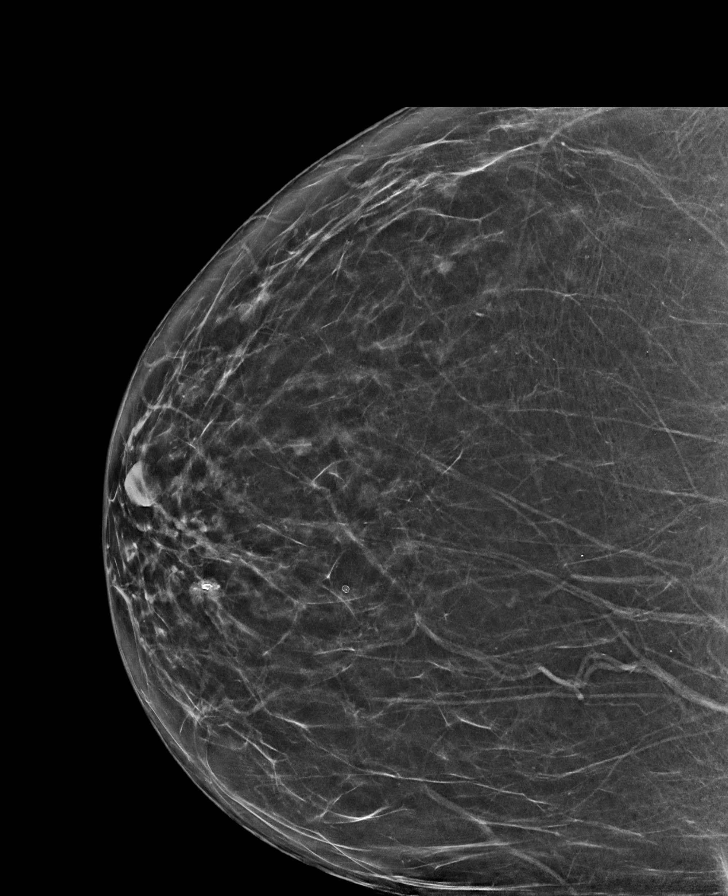

[R CV synth-2D]
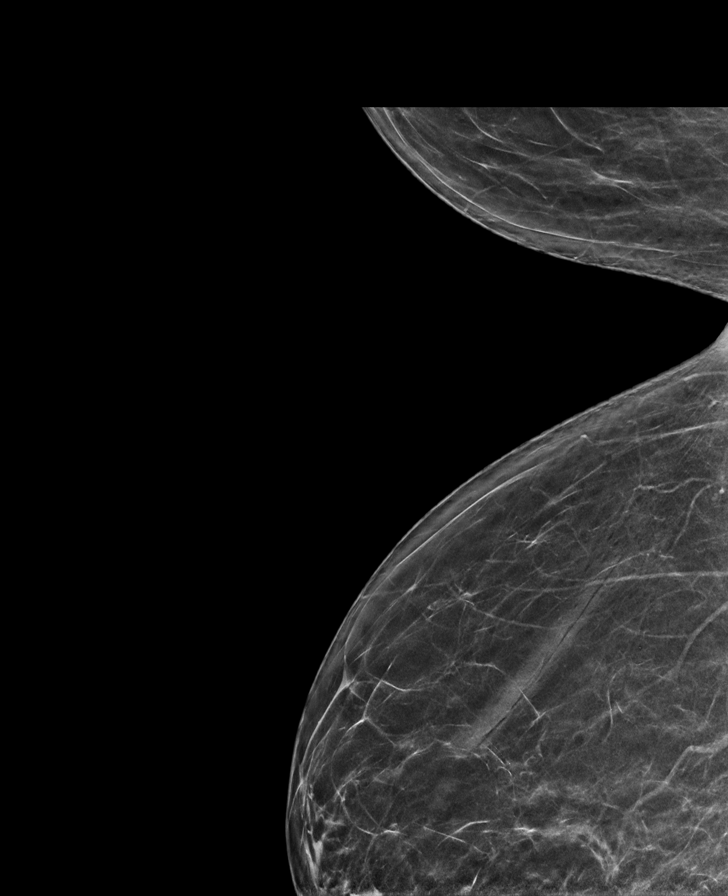

[L MLO synth-2D (2 of 2)]
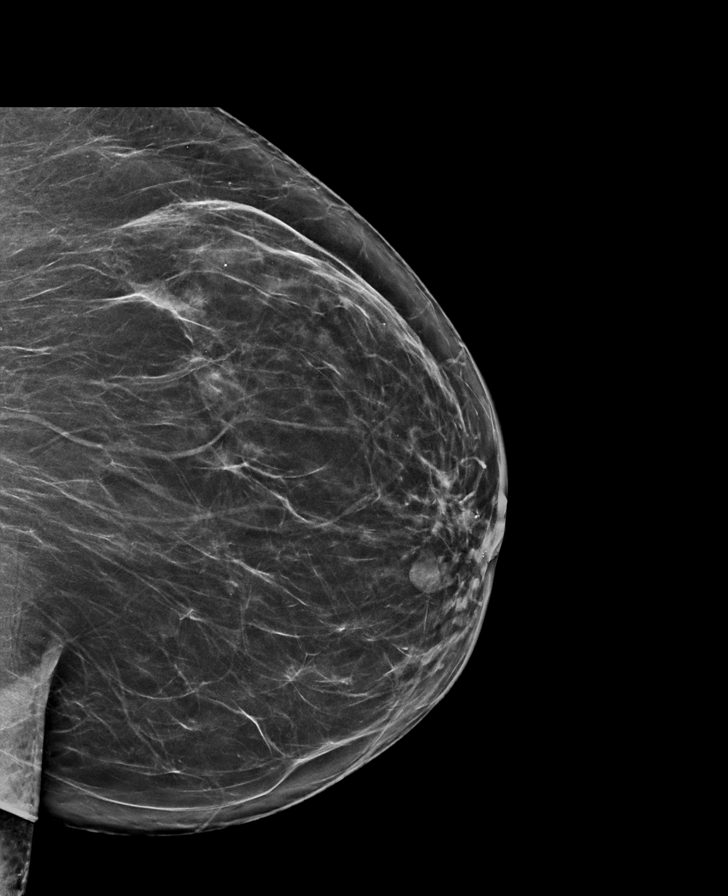

[R CC tomo · tomo slice 37/72.0]
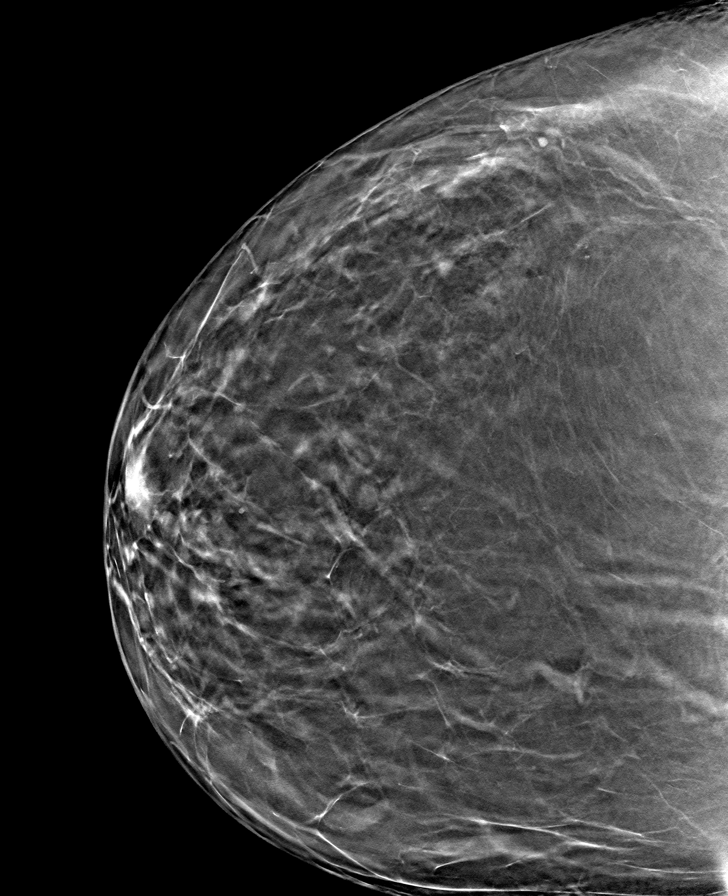

[8 of 40 positions shown; findings below may reference images not displayed]

ACR Breast Density Category b: There are scattered areas of
fibroglandular density.
FINDINGS: In the left breast, a possible mass warrants further evaluation. In
the right breast, no findings suspicious for malignancy. Images were
processed with CAD.
IMPRESSION: Further evaluation is suggested for possible mass in the left
breast.

RECOMMENDATION:
Ultrasound of the left breast. (Code:F4-8-550)

The patient will be contacted regarding the findings, and additional
imaging will be scheduled.

BI-RADS CATEGORY  0: Incomplete. Need additional imaging evaluation
and/or prior mammograms for comparison.

## 2020-06-06 ENCOUNTER — Ambulatory Visit (INDEPENDENT_AMBULATORY_CARE_PROVIDER_SITE_OTHER): Payer: Medicare Other | Admitting: Otolaryngology

## 2020-06-06 ENCOUNTER — Other Ambulatory Visit: Payer: Self-pay

## 2020-06-06 ENCOUNTER — Encounter (INDEPENDENT_AMBULATORY_CARE_PROVIDER_SITE_OTHER): Payer: Self-pay | Admitting: Otolaryngology

## 2020-06-06 VITALS — Temp 98.2°F

## 2020-06-06 DIAGNOSIS — H6121 Impacted cerumen, right ear: Secondary | ICD-10-CM

## 2020-06-06 DIAGNOSIS — R490 Dysphonia: Secondary | ICD-10-CM

## 2020-06-06 NOTE — Progress Notes (Signed)
HPI: Veronica Hansen is a 74 y.o. female who presents for evaluation of congestion in her throat and some hoarseness.  She also wanted her ears checked as she has had problems with mostly with the right ear.  She was last seen here little over 3 years ago.  She has Flonase but has not been using this regularly.  She does use nasal CPAP at night for obstructive sleep apnea.  Last time she was seen she had wax buildup in her right ear and has similar problems today.  She has used Mucinex which seems to help some with the congestion in her throat.  She is having no pain or trouble swallowing.  Mostly just congestion in the throat with some raspiness in her voice.  Past Medical History:  Diagnosis Date  . Arthritis   . Diabetes mellitus    type 2  . Hyperlipidemia   . Hypertension   . Incontinence   . OSA (obstructive sleep apnea)   . OSA on CPAP 03/07/2014  . Runny nose    seasonal - poss allergies  . Sinus problem   . SOB (shortness of breath)    Past Surgical History:  Procedure Laterality Date  . A-FLUTTER ABLATION N/A 08/19/2018   Procedure: A-FLUTTER ABLATION;  Surgeon: Evans Lance, MD;  Location: [facility];  Service: Cardiovascular;  Laterality: N/A;  . dental implants  2011  . 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 TOTAL KNEE ARTHROPLASTY  2007   right   Social History   Socioeconomic History  . Marital status: Married    Spouse name: Not on file  . Number of children: Not on file  . Years of education: Not on file  . Highest education level: Not on file  Occupational History  . Not on file  Tobacco Use  . Smoking status: Former Smoker    Quit date: 08/19/2005    Years since quitting: 14.8  . Smokeless tobacco: Never Used  Substance and Sexual Activity  . Alcohol use: No  . Drug use: No  . Sexual activity: Not on file  Other Topics Concern  . Not on file  Social History Narrative  . Not on file   Social Determinants of Health   Financial  Resource Strain:   . Difficulty of Paying Living Expenses:   Food Insecurity:   . Worried About Charity fundraiser in the Last Year:   . Arboriculturist in the Last Year:   Transportation Needs:   . Film/video editor (Medical):   Marland Kitchen Lack of Transportation (Non-Medical):   Physical Activity:   . Days of Exercise per Week:   . Minutes of Exercise per Session:   Stress:   . Feeling of Stress :   Social Connections:   . Frequency of Communication with Friends and Family:   . Frequency of Social Gatherings with Friends and Family:   . Attends Religious Services:   . Active Member of Clubs or Organizations:   . Attends Archivist Meetings:   Marland Kitchen Marital Status:    Family History  Problem Relation Age of Onset  . Diabetes Father   . Diabetes Brother    No Known Allergies Prior to Admission medications   Medication Sig Start Date End Date Taking? Authorizing Provider  atenolol (TENORMIN) 50 MG tablet Take 50 mg by mouth daily. Take an extra 1/2 tablet as needed for systolic greater than 536   Yes  [provider]  Biotin 1 MG CAPS Take 1 mg by mouth daily.    Yes [provider]  IRON PO Take 1 tablet by mouth daily.   Yes [provider]  lisinopril (PRINIVIL,ZESTRIL) 20 MG tablet Take 20 mg by mouth daily. Take an extra 1/2 tablet for systolic BP greater than 176   Yes [provider]  Multiple Vitamin (MULTIVITAMIN) tablet Take 1 tablet by mouth daily.     Yes [provider]  Multiple Vitamins-Minerals (PRESERVISION AREDS 2+MULTI VIT PO) Take 1 tablet by mouth 2 (two) times daily.    Yes [provider]  omeprazole (PRILOSEC) 20 MG capsule Take 20 mg by mouth daily as needed (heartburn).  02/23/14  Yes [provider]  simvastatin (ZOCOR) 80 MG tablet Take 80 mg by mouth at bedtime.     Yes [provider]     Positive ROS: Otherwise negative  All other systems have been reviewed and were  otherwise negative with the exception of those mentioned in the HPI and as above.  Physical Exam: Constitutional: Alert, well-appearing, no acute distress Ears: External ears without lesions or tenderness.  She has small ear canals bilaterally with wax buildup in the right ear canal minimal on the left side.  Right ear canal was cleaned with a curette and suction. Nasal: External nose without lesions. Septum deviated to the left.  Mild mucosal swelling with a little bit of thick mucus on the left side.  Both middle meatus regions were clear with no evidence of acute infection. Oral: Lips and gums without lesions. Tongue and palate mucosa without lesions. Posterior oropharynx clear.  She is status post tonsillectomy. Fiberoptic laryngoscopy was performed on both sides but FOL was performed predominately through the right side.  Base of tongue vallecula and epiglottis were normal vocal cords were clear bilaterally although she had a little bit of supraglottic mucus. Neck: No palpable adenopathy or masses Respiratory: Breathing comfortably  Skin: No facial/neck lesions or rash noted.  Cerumen impaction removal  Date/Time: 06/06/2020 5:22 PM Performed by: Rozetta Nunnery, MD Authorized by: Rozetta Nunnery, MD   Consent:    Consent obtained:  Verbal   Consent given by:  Patient   Risks discussed:  Pain and bleeding Procedure details:    Location:  R ear   Procedure type: curette and suction   Post-procedure details:    Inspection:  TM intact and canal normal   Hearing quality:  Improved   Patient tolerance of procedure:  Tolerated well, no immediate complications Comments:     TMs are clear bilaterally. Laryngoscopy  Date/Time: 06/06/2020 5:23 PM Performed by: Rozetta Nunnery, MD Authorized by: Rozetta Nunnery, MD   Consent:    Consent obtained:  Verbal   Consent given by:  Patient Procedure details:    Indications: hoarseness, dysphagia, or aspiration      Medication:  Afrin   Instrument: flexible fiberoptic laryngoscope     Scope location: right nare   Sinus:    Right middle meatus: normal     Left middle meatus: normal     Right nasopharynx: normal   Mouth:    Oropharynx: normal     Vallecula: normal     Base of tongue: normal     Epiglottis: normal   Throat:    True vocal cords: normal   Comments:     On fiberoptic laryngoscopy the vocal cords were clear with normal vocal cord mobility bilaterally.  She has some mucus around the supraglottis but otherwise things were clear.    Assessment: Mild hoarseness secondary to supraglottic mucus Right ear cerumen impaction.  Plan: Recommend use of Mucinex since this seems to help.  Also recommended regular use of Flonase 2 sprays each nostril at night and saline rinse during the day. Reassured her of normal vocal cord examination otherwise. She will follow-up as needed.  Radene Journey, MD

## 2021-02-12 ENCOUNTER — Encounter (HOSPITAL_BASED_OUTPATIENT_CLINIC_OR_DEPARTMENT_OTHER): Payer: Self-pay

## 2021-02-12 ENCOUNTER — Emergency Department (HOSPITAL_BASED_OUTPATIENT_CLINIC_OR_DEPARTMENT_OTHER)
Admission: EM | Admit: 2021-02-12 | Discharge: 2021-02-13 | Disposition: A | Discharge status: Home / Self Care | Payer: Medicare Other | Attending: Emergency Medicine | Admitting: Emergency Medicine

## 2021-02-12 ENCOUNTER — Other Ambulatory Visit: Payer: Self-pay

## 2021-02-12 ENCOUNTER — Emergency Department (HOSPITAL_BASED_OUTPATIENT_CLINIC_OR_DEPARTMENT_OTHER): Payer: Medicare Other

## 2021-02-12 DIAGNOSIS — I10 Essential (primary) hypertension: Secondary | ICD-10-CM | POA: Diagnosis not present

## 2021-02-12 DIAGNOSIS — S0231XA Fracture of orbital floor, right side, initial encounter for closed fracture: Secondary | ICD-10-CM | POA: Diagnosis not present

## 2021-02-12 DIAGNOSIS — Z96653 Presence of artificial knee joint, bilateral: Secondary | ICD-10-CM | POA: Diagnosis not present

## 2021-02-12 DIAGNOSIS — W01198A Fall on same level from slipping, tripping and stumbling with subsequent striking against other object, initial encounter: Secondary | ICD-10-CM | POA: Insufficient documentation

## 2021-02-12 DIAGNOSIS — S5291XB Unspecified fracture of right forearm, initial encounter for open fracture type I or II: Secondary | ICD-10-CM

## 2021-02-12 DIAGNOSIS — Z23 Encounter for immunization: Secondary | ICD-10-CM | POA: Diagnosis not present

## 2021-02-12 DIAGNOSIS — Y92194 Driveway of other specified residential institution as the place of occurrence of the external cause: Secondary | ICD-10-CM | POA: Insufficient documentation

## 2021-02-12 DIAGNOSIS — S52571B Other intraarticular fracture of lower end of right radius, initial encounter for open fracture type I or II: Secondary | ICD-10-CM | POA: Diagnosis not present

## 2021-02-12 DIAGNOSIS — Y9301 Activity, walking, marching and hiking: Secondary | ICD-10-CM | POA: Insufficient documentation

## 2021-02-12 DIAGNOSIS — Z87891 Personal history of nicotine dependence: Secondary | ICD-10-CM | POA: Diagnosis not present

## 2021-02-12 DIAGNOSIS — Z79899 Other long term (current) drug therapy: Secondary | ICD-10-CM | POA: Insufficient documentation

## 2021-02-12 DIAGNOSIS — S52611A Displaced fracture of right ulna styloid process, initial encounter for closed fracture: Secondary | ICD-10-CM | POA: Diagnosis not present

## 2021-02-12 DIAGNOSIS — E119 Type 2 diabetes mellitus without complications: Secondary | ICD-10-CM | POA: Insufficient documentation

## 2021-02-12 DIAGNOSIS — S6991XA Unspecified injury of right wrist, hand and finger(s), initial encounter: Secondary | ICD-10-CM | POA: Diagnosis present

## 2021-02-12 LAB — CBC WITH DIFFERENTIAL/PLATELET
Abs Immature Granulocytes: 0.06 10*3/uL (ref 0.00–0.07)
Basophils Absolute: 0 10*3/uL (ref 0.0–0.1)
Basophils Relative: 0 %
Eosinophils Absolute: 0.1 10*3/uL (ref 0.0–0.5)
Eosinophils Relative: 1 %
HCT: 34.1 % — ABNORMAL LOW (ref 36.0–46.0)
Hemoglobin: 10.4 g/dL — ABNORMAL LOW (ref 12.0–15.0)
Immature Granulocytes: 1 %
Lymphocytes Relative: 15 %
Lymphs Abs: 1.6 10*3/uL (ref 0.7–4.0)
MCH: 21.5 pg — ABNORMAL LOW (ref 26.0–34.0)
MCHC: 30.5 g/dL (ref 30.0–36.0)
MCV: 70.6 fL — ABNORMAL LOW (ref 80.0–100.0)
Monocytes Absolute: 0.7 10*3/uL (ref 0.1–1.0)
Monocytes Relative: 7 %
Neutro Abs: 8.1 10*3/uL — ABNORMAL HIGH (ref 1.7–7.7)
Neutrophils Relative %: 76 %
Platelets: 265 10*3/uL (ref 150–400)
RBC: 4.83 MIL/uL (ref 3.87–5.11)
RDW: 17.1 % — ABNORMAL HIGH (ref 11.5–15.5)
WBC: 10.5 10*3/uL (ref 4.0–10.5)
nRBC: 0 % (ref 0.0–0.2)

## 2021-02-12 LAB — BASIC METABOLIC PANEL
Anion gap: 9 (ref 5–15)
BUN: 11 mg/dL (ref 8–23)
CO2: 24 mmol/L (ref 22–32)
Calcium: 9.2 mg/dL (ref 8.9–10.3)
Chloride: 104 mmol/L (ref 98–111)
Creatinine, Ser: 0.5 mg/dL (ref 0.44–1.00)
GFR, Estimated: >60 mL/min (ref >60)
Glucose, Bld: 150 mg/dL — ABNORMAL HIGH (ref 70–99)
Potassium: 3.9 mmol/L (ref 3.5–5.1)
Sodium: 137 mmol/L (ref 135–145)

## 2021-02-12 MED ORDER — HYDROMORPHONE HCL 1 MG/ML IJ SOLN
0.5000 mg | Freq: Once | INTRAMUSCULAR | Status: AC
  Administered 2021-02-12: 0.5 mg via INTRAVENOUS
  Filled 2021-02-12: qty 1

## 2021-02-12 MED ORDER — FENTANYL CITRATE (PF) 100 MCG/2ML IJ SOLN
50.0000 ug | Freq: Once | INTRAMUSCULAR | Status: AC
  Administered 2021-02-12: 50 ug via INTRAVENOUS
  Filled 2021-02-12: qty 2

## 2021-02-12 MED ORDER — SODIUM CHLORIDE 0.9 % IV SOLN
INTRAVENOUS | Status: DC | PRN
  Administered 2021-02-12: 250 mL via INTRAVENOUS

## 2021-02-12 MED ORDER — TETANUS-DIPHTH-ACELL PERTUSSIS 5-2.5-18.5 LF-MCG/0.5 IM SUSY
0.5000 mL | PREFILLED_SYRINGE | Freq: Once | INTRAMUSCULAR | Status: AC
  Administered 2021-02-12: 0.5 mL via INTRAMUSCULAR
  Filled 2021-02-12: qty 0.5

## 2021-02-12 MED ORDER — LIDOCAINE HCL (PF) 1 % IJ SOLN
10.0000 mL | Freq: Once | INTRAMUSCULAR | Status: AC
  Administered 2021-02-12: 10 mL
  Filled 2021-02-12: qty 10

## 2021-02-12 MED ORDER — CEFAZOLIN SODIUM-DEXTROSE 1-4 GM/50ML-% IV SOLN
1.0000 g | Freq: Once | INTRAVENOUS | Status: AC
  Administered 2021-02-12: 1 g via INTRAVENOUS
  Filled 2021-02-12: qty 50

## 2021-02-12 MED ORDER — ONDANSETRON HCL 4 MG/2ML IJ SOLN
4.0000 mg | Freq: Once | INTRAMUSCULAR | Status: AC
  Administered 2021-02-12: 4 mg via INTRAVENOUS
  Filled 2021-02-12: qty 2

## 2021-02-12 NOTE — ED Provider Notes (Signed)
[facility] EMERGENCY DEPARTMENT Provider Note   CSN: [id number] Arrival date & time: 02/12/21  1956     History Chief Complaint  Patient presents with  . Fall    Veronica Hansen is a 75 y.o. female past medical history of type 2 diabetes, hypertension, obstructive sleep apnea on CPAP at night, a flutter status post ablation, presenting to the emergency department for evaluation of mechanical fall that occurred prior to arrival.  She states she was walking on her driveway to get her mail, however she admits she is not wearing the proper shoes.  She was wearing slippers and tripped over a lip on the sidewalk.  She fell forward onto her right arm.  She has most of her pain to her right wrist rating up to her elbow.  She is also aware she has some bruising to her face though does not recall particularly hitting her head.  She did not lose consciousness.  Her neighbor was driving past at the time of the incident and stated that she looked to have been dragging her face on the ground trying to get herself up.  Patient denies neck or back pain, chest or abdominal pain, headache, vision changes, nausea, vomiting.  Not on anticoagulation.  She denies numbness to the right hand previous injury.  She is right-hand dominant.  Last Tdap is unknown.  No known allergies to antibiotics.  The history is provided by the patient.       Past Medical History:  Diagnosis Date  . Arthritis   . Diabetes mellitus    type 2  . Hyperlipidemia   . Hypertension   . Incontinence   . OSA (obstructive sleep apnea)   . OSA on CPAP 03/07/2014  . Runny nose    seasonal - poss allergies  . Sinus problem   . SOB (shortness of breath)     Patient Active Problem List   Diagnosis Date Noted  . Atrial flutter ([place]) 08/19/2018  . OSA on CPAP 03/07/2014    Past Surgical History:  Procedure Laterality Date  . A-FLUTTER ABLATION N/A 08/19/2018   Procedure: A-FLUTTER ABLATION;  Surgeon: Evans Lance,  MD;  Location: [facility];  Service: Cardiovascular;  Laterality: N/A;  . dental implants  2011  . 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 TOTAL KNEE ARTHROPLASTY  2007   right     OB History   No obstetric history on file.     Family History  Problem Relation Age of Onset  . Diabetes Father   . Diabetes Brother     Social History   Tobacco Use  . Smoking status: Former Smoker    Quit date: 08/19/2005    Years since quitting: 15.4  . Smokeless tobacco: Never Used  Substance Use Topics  . Alcohol use: No  . Drug use: No    Home Medications Prior to Admission medications   Medication Sig Start Date End Date Taking? Authorizing Provider  cephALEXin (KEFLEX) 500 MG capsule Take 1 capsule (500 mg total) by mouth 3 (three) times daily for 7 days. 02/13/21 02/20/21 Yes Quentin Cornwall, [place] N, PA-C  ondansetron (ZOFRAN ODT) 4 MG disintegrating tablet Take 1 tablet (4 mg total) by mouth every 8 (eight) hours as needed for nausea or vomiting. 02/13/21  Yes Camil Wilhelmsen, [place] N, PA-C  oxyCODONE-acetaminophen (PERCOCET/ROXICET) 5-325 MG tablet Take 1-2 tablets by mouth every 6 (six) hours as needed for severe pain. 02/13/21  Yes Quentin Cornwall,  [place] N, PA-C  atenolol (TENORMIN) 50 MG tablet Take 50 mg by mouth daily. Take an extra 1/2 tablet as needed for systolic greater than 638    [provider]  Biotin 1 MG CAPS Take 1 mg by mouth daily.     [provider]  IRON PO Take 1 tablet by mouth daily.    [provider]  lisinopril (PRINIVIL,ZESTRIL) 20 MG tablet Take 20 mg by mouth daily. Take an extra 1/2 tablet for systolic BP greater than 756    [provider]  Multiple Vitamin (MULTIVITAMIN) tablet Take 1 tablet by mouth daily.      [provider]  Multiple Vitamins-Minerals (PRESERVISION AREDS 2+MULTI VIT PO) Take 1 tablet by mouth 2 (two) times daily.     [provider]  omeprazole (PRILOSEC) 20 MG capsule Take 20 mg by mouth daily  as needed (heartburn).  02/23/14   [provider]  simvastatin (ZOCOR) 80 MG tablet Take 80 mg by mouth at bedtime.      [provider]    Allergies    Patient has no known allergies.  Review of Systems   Review of Systems  Eyes: Negative for visual disturbance.  Musculoskeletal: Positive for arthralgias. Negative for back pain and neck pain.  Skin: Positive for color change and wound.  Neurological: Negative for syncope and headaches.  Hematological: Does not bruise/bleed easily.  Psychiatric/Behavioral: Negative for confusion.  All other systems reviewed and are negative.   Physical Exam Updated Vital Signs BP 137/70   Pulse 67   Temp 98.2 F (36.8 C)   Resp 16   SpO2 92%   Physical Exam Vitals and nursing note reviewed.  Constitutional:      General: She is not in acute distress.    Appearance: She is well-developed.  HENT:     Head: Normocephalic.     Comments: Right inferior periorbital ecchymosis and mild swelling.  No bony tenderness to facial bones.  There is superficial abrasions to the tip of the nose and the chin.  Normal range of motion of the jaw.  She has dentures. No battle sign Eyes:     Extraocular Movements: Extraocular movements intact.     Conjunctiva/sclera: Conjunctivae normal.     Pupils: Pupils are equal, round, and reactive to light.     Comments: No subconjunctival hemorrhage  Cardiovascular:     Rate and Rhythm: Normal rate and regular rhythm.  Pulmonary:     Effort: Pulmonary effort is normal. No respiratory distress.     Breath sounds: Normal breath sounds.  Abdominal:     General: Bowel sounds are normal.     Palpations: Abdomen is soft.     Tenderness: There is no abdominal tenderness.  Musculoskeletal:     Cervical back: Normal range of motion. No tenderness.     Comments: Right wrist with obvious deformity, deep abrasion to the dorsal ulnar aspect of the wrist that appears to be slowly oozing.  Brisk distal  capillary refill, strong radial pulse.  Patient is able to flex and extend the elbow without difficulty.  Shoulder is nontender Spontaneously moving lower extremities without difficulty or pain.  Skin:    General: Skin is warm.  Neurological:     Mental Status: She is alert.     Comments: Speech is fluent without aphasia.  EOMs are intact without entrapment.  PERRL.  Normal sensation to light touch to all 4 extremities.  Psychiatric:  Behavior: Behavior normal.     ED Results / Procedures / Treatments   Labs (all labs ordered are listed, but only abnormal results are displayed) Labs Reviewed  CBC WITH DIFFERENTIAL/PLATELET - Abnormal; Notable for the following components:      Result Value   Hemoglobin 10.4 (*)    HCT 34.1 (*)    MCV 70.6 (*)    MCH 21.5 (*)    RDW 17.1 (*)    Neutro Abs 8.1 (*)    All other components within normal limits  BASIC METABOLIC PANEL - Abnormal; Notable for the following components:   Glucose, Bld 150 (*)    All other components within normal limits    EKG None  Radiology DG Wrist Complete Right  Result Date: 02/13/2021 CLINICAL DATA:  Right wrist fracture, post reduction imaging EXAM: RIGHT WRIST - COMPLETE 3+ VIEW COMPARISON:  9:12 p.m. FINDINGS: Three view radiograph of the a right wrist is limited by obliquity on lateral examination. External immobilizer has been applied. A a comminuted transverse fracture of the distal right radius is again identified with 1 shaft with volar displacement of the distal fracture fragment. Previously noted volar tilt of the distal radial articular surface appears to have been partial collected with now neutral tilt of the a distal radial articular surface. Loss of radial inclination again noted. Resultant mild ulnar positive variance again noted. Displaced fracture of the base of the ulnar styloid again noted. IMPRESSION: Comminuted fracture of the distal right radius with mild correction of distal radial volar  tilt, but persistent volar displacement, loss of radial inclination, and mild resultant ulnar positive variance. Stable displaced fracture of the base of the ulnar styloid. Electronically Signed   By: Fidela Salisbury MD   On: 02/13/2021 00:27   DG Wrist Complete Right  Result Date: 02/12/2021 CLINICAL DATA:  Tripped and fell EXAM: RIGHT WRIST - COMPLETE 3+ VIEW COMPARISON:  None. FINDINGS: Acute displaced ulnar styloid fracture. Acute comminuted intra-articular distal radius fracture with close to 1 shaft diameter volar displacement of distal fracture fragment and about 12 mm of overriding. Distal radioulnar joint appears widened. There is soft tissue deformity. IMPRESSION: 1. Acute comminuted and displaced intra-articular distal radius fracture with overriding. 2. Acute displaced ulnar styloid fracture. Electronically Signed   By: Donavan Foil M.D.   On: 02/12/2021 21:39   CT Head Wo Contrast  Result Date: 02/12/2021 CLINICAL DATA:  Tripped and fell, head trauma EXAM: CT HEAD WITHOUT CONTRAST TECHNIQUE: Contiguous axial images were obtained from the base of the skull through the vertex without intravenous contrast. COMPARISON:  None. FINDINGS: Brain: No acute infarct or hemorrhage. Lateral ventricles and midline structures are unremarkable. No acute extra-axial fluid collections. No mass effect. Vascular: No hyperdense vessel or unexpected calcification. Skull: Normal. Negative for fracture or focal lesion. Sinuses/Orbits: No acute finding. Other: Right periorbital soft tissue swelling. IMPRESSION: 1. Right periorbital soft tissue swelling. 2. No acute intracranial process. Electronically Signed   By: Randa Ngo M.D.   On: 02/12/2021 22:23   CT Cervical Spine Wo Contrast  Result Date: 02/12/2021 CLINICAL DATA:  Tripped and fell, trauma EXAM: CT CERVICAL SPINE WITHOUT CONTRAST TECHNIQUE: Multidetector CT imaging of the cervical spine was performed without intravenous contrast. Multiplanar CT image  reconstructions were also generated. COMPARISON:  None. FINDINGS: Alignment: Alignment is anatomic. Skull base and vertebrae: No acute fracture. No primary bone lesion or focal pathologic process. Soft tissues and spinal canal: No prevertebral fluid or swelling. No visible canal  hematoma. Disc levels: Mild diffuse cervical spondylosis most pronounced at C4-5, C5-6, and C6-7. Facet hypertrophic changes are most pronounced at C2-3 and C3-4. Left predominant neural foraminal narrowing at C4-5 and C6-7. Upper chest: Airway is patent.  Lung apices are clear. Other: Reconstructed images demonstrate no additional findings. IMPRESSION: 1. No acute cervical spine fracture. 2. Multilevel cervical spondylosis and facet hypertrophy, greatest at C4-5 and C6-7. Electronically Signed   By: Randa Ngo M.D.   On: 02/12/2021 22:27   CT Maxillofacial WO CM  Result Date: 02/12/2021 CLINICAL DATA:  Tripped and fell, trauma EXAM: CT MAXILLOFACIAL WITHOUT CONTRAST TECHNIQUE: Multidetector CT imaging of the maxillofacial structures was performed. Multiplanar CT image reconstructions were also generated. COMPARISON:  None. FINDINGS: Osseous: There is a minimally displaced right orbital floor fracture. No evidence of extraocular muscle entrapment. No other acute or destructive bony lesions. Orbits: Mild edema within the extraconal fat inferior right orbit. The globes and extraocular muscles are intact. Optic nerves are unremarkable. Sinuses: Clear. Soft tissues: Prominent right periorbital soft tissue swelling. Remaining soft tissues are normal. Limited intracranial: No significant or unexpected finding. IMPRESSION: 1. Minimally displaced right orbital floor fracture. No extraocular muscle entrapment. 2. Right periorbital soft tissue swelling. Electronically Signed   By: Randa Ngo M.D.   On: 02/12/2021 22:25    Procedures .Nerve Block  Date/Time: 02/13/2021 12:13 AM Performed by: Kenyana Husak, [place] N, PA-C Authorized by:  Cole Klugh, [place] N, PA-C   Consent:    Consent obtained:  Verbal   Consent given by:  Patient   Risks, benefits, and alternatives were discussed: yes     Risks discussed:  Unsuccessful block, pain and intravenous injection   Alternatives discussed:  Alternative treatment Universal protocol:    Patient identity confirmed:  Verbally with patient Indications:    Indications:  Procedural anesthesia Location:    Nerve block body site: Hematoma block to the fracture site of the right distal forearm.   Laterality:  Right Pre-procedure details:    Skin preparation:  Chlorhexidine Procedure details:    Block needle gauge:  25 G   Anesthetic injected:  Lidocaine 1% w/o epi   Injection procedure:  Anatomic landmarks palpated (Positive aspiration for dark red blood confirmed proper positioning in hematoma) Post-procedure details:    Outcome:  Anesthesia achieved   Procedure completion:  Tolerated well, no immediate complications     Medications Ordered in ED Medications  0.9 %  sodium chloride infusion ( Intravenous Stopped 02/12/21 2343)  oxyCODONE-acetaminophen (PERCOCET/ROXICET) 5-325 MG per tablet 1 tablet (has no administration in time range)  fentaNYL (SUBLIMAZE) injection 50 mcg (50 mcg Intravenous Given 02/12/21 2140)  Tdap (BOOSTRIX) injection 0.5 mL (0.5 mLs Intramuscular Given 02/12/21 2146)  ceFAZolin (ANCEF) IVPB 1 g/50 mL premix (0 g Intravenous Stopped 02/12/21 2343)  lidocaine (PF) (XYLOCAINE) 1 % injection 10 mL (10 mLs Infiltration Given by Other 02/12/21 2342)  HYDROmorphone (DILAUDID) injection 0.5 mg (0.5 mg Intravenous Given 02/12/21 2342)  ondansetron (ZOFRAN) injection 4 mg (4 mg Intravenous Given 02/12/21 2342)    ED Course  I have reviewed the triage vital signs and the nursing notes.  Pertinent labs & imaging results that were available during my care of the patient were reviewed by me and considered in my medical decision making (see chart for details).  Clinical  Course as of 02/13/21 0032  Wed Feb 12, 2021  2305 Consulted with hand specialist Dr. Caralyn Guile.  Requests reduction at bedside with splint placement, wound care, and instruct  patient to report to his office in the morning tomorrow, no particular time.  Plans to repair in the afternoon.  NPO midnight  [JR]  2313 Discussed with maxillofacial specialist on-call, Dr. Michael Litter.  Agrees with outpatient management, stresses sinus precautions, no blowing nose and open nose sneezes are recommended.  Patient to call for outpatient follow-up. [JR]    Clinical Course User Index [JR] Shreyan Hinz, [place] N, PA-C   MDM Rules/Calculators/A&P                          Patient is presenting for evaluation after mechanical fall today. Hit her face on the ground without LOC. Is not complaining of much pain to her face though does have periorbital bruising and swelling to right eye. No evidence of ocular trauma or entrapment. Not on thinners. Right wrist with obvious deformity with overlying wound. Normal distal sensation and perfusion.   CT max face with minimally displaced orbital floor fracture. Wrist with distal radius and ulnar styloid fracture with displacement and angulation.   Dr. Mancel Parsons with maxillo facial consulted regarding orbital floor fracture.  Agrees with outpatient management, stresses sinus precautions  Dr. Apolonio Schneiders with hand surgery consulted and requests wound care, reduction and splint placement.  Requests patient report to his office in the morning with plans for operative management in the afternoon.  Keflex Rx  Open fracture prophylactically treated with Ancef IV here.  Tdap is updated.  Pain is managed well.  Hematoma block was performed and wrist was reduced at bedside, patient tolerated very well. Please see Dr. Bettina Gavia procedure note for reduction. Placed in sugar tong splint, postreduction film is with slight improvement though does require operative management.  Will prescribe pain  medication, referrals for outpatient follow-up.  Discuss strict return precautions regarding symptoms of entrapment, head injury,etc. patient verbalized understanding of this.  [place] Controlled Substance reporting System queried  Final Clinical Impression(s) / ED Diagnoses Final diagnoses:  Type I or II open fracture of distal end of right forearm, initial encounter  Closed fracture of right orbital floor, initial encounter [facility])    Rx / DC Orders ED Discharge Orders         Ordered    cephALEXin (KEFLEX) 500 MG capsule  3 times daily        02/13/21 0020    oxyCODONE-acetaminophen (PERCOCET/ROXICET) 5-325 MG tablet  Every 6 hours PRN        02/13/21 0020    ondansetron (ZOFRAN ODT) 4 MG disintegrating tablet  Every 8 hours PRN        02/13/21 0020           Xayvier Vallez, [place] N, PA-C 02/13/21 0034    Arnaldo Natal, MD 02/15/21 [phone]

## 2021-02-12 NOTE — ED Triage Notes (Addendum)
Pt states she tripped/fell ~7pm-deformity/abrasion to right wrist, bruising under right eye, abrasion to nose-denies LOC-denies head/neck pain-NAD-steady gait

## 2021-02-12 NOTE — ED Notes (Signed)
Pt. [place] in her driveway injuring her right wrist that is swollen, her left wrist feels "sprained"/ 'sore".  She has a hematoma below her right eye and dried blood around her nostrils  but denies facial pain.

## 2021-02-13 ENCOUNTER — Emergency Department (HOSPITAL_BASED_OUTPATIENT_CLINIC_OR_DEPARTMENT_OTHER): Payer: Medicare Other

## 2021-02-13 ENCOUNTER — Encounter (HOSPITAL_COMMUNITY): Payer: Self-pay | Admitting: Orthopedic Surgery

## 2021-02-13 MED ORDER — OXYCODONE-ACETAMINOPHEN 5-325 MG PO TABS
1.0000 | ORAL_TABLET | Freq: Once | ORAL | Status: AC
  Administered 2021-02-13: 1 via ORAL
  Filled 2021-02-13: qty 1

## 2021-02-13 MED ORDER — ONDANSETRON 4 MG PO TBDP: 4.0000 mg | ORAL_TABLET | Freq: Three times a day (TID) | ORAL | 0 refills | Status: DC | PRN

## 2021-02-13 MED ORDER — CEPHALEXIN 500 MG PO CAPS: 500.0000 mg | ORAL_CAPSULE | Freq: Three times a day (TID) | ORAL | 0 refills | Status: AC

## 2021-02-13 MED ORDER — OXYCODONE-ACETAMINOPHEN 5-325 MG PO TABS: 1.0000 | ORAL_TABLET | Freq: Four times a day (QID) | ORAL | 0 refills | Status: DC | PRN

## 2021-02-13 NOTE — ED Notes (Signed)
Veronica Hansen, Veronica Hansen at bedside w/ Dr. Joya Gaskins, ED tech, and Delrae Alfred, RN. Reducing and splinting pt wrist

## 2021-02-13 NOTE — ED Provider Notes (Signed)
Closed reduction of distal radius fracture: After informed consent, the patient's wound was irrigated copiously and a bandage was applied. Traction was applied using anesthesia provided via hematoma block. A splint was applied. The patient tolerated the procedure well.    Arnaldo Natal, MD 02/13/21 [phone]

## 2021-02-13 NOTE — Discharge Instructions (Signed)
Please report to Dr. Angus Palms office in the morning for further evaluation and scheduling of your operation tomorrow afternoon. Keep the splint clean, dry and in place at all times. Elevate your arm as much as possible. You can take oxycodone every 6 hours as needed for pain. Take the antibiotic, cephalexin (keflex), as directed. Call the Dr. Sander Radon office the next business day to schedule an appointment for follow up on your orbit fracture. DO NOT BLOW YOUR NOSE. If you have to sneeze, try to keep your mouth open. Please wait to use your CPAP again until Dr. Mancel Parsons tells you to do so.  Return to the ER immediately for severe headache, pain with movement or inability to move your right eye, double vision, or other concerning symptoms.

## 2021-02-13 NOTE — Progress Notes (Addendum)
Veronica Hansen  Denies chest pain or shortness of breath. I gave Veronica Hansen the address of Covid testing site  and asked her to be tested in am and then quarantine.  Veronica Hansen has a history of Diabetes, but since gastric bypass she no longer takes medication. Veronica Hansen reports that last A1C done in March of this year  was 5.8. Patient does not check CBG.

## 2021-02-14 ENCOUNTER — Other Ambulatory Visit (HOSPITAL_COMMUNITY)
Admission: RE | Admit: 2021-02-14 | Discharge: 2021-02-14 | Disposition: A | Discharge status: Home / Self Care | Payer: Medicare Other | Source: Ambulatory Visit | Attending: Orthopedic Surgery | Admitting: Orthopedic Surgery

## 2021-02-14 DIAGNOSIS — Z01812 Encounter for preprocedural laboratory examination: Secondary | ICD-10-CM | POA: Diagnosis present

## 2021-02-14 DIAGNOSIS — Z20822 Contact with and (suspected) exposure to covid-19: Secondary | ICD-10-CM | POA: Diagnosis not present

## 2021-02-14 NOTE — Anesthesia Preprocedure Evaluation (Addendum)
Anesthesia Evaluation  Patient identified by MRN, date of birth, ID band Patient awake    Reviewed: Allergy & Precautions, NPO status , Patient's Chart, lab work & pertinent test results  History of Anesthesia Complications Negative for: history of anesthetic complications  Airway Mallampati: I  TM Distance: >3 FB Neck ROM: Full    Dental  (+) Edentulous Upper, Edentulous Lower   Pulmonary sleep apnea and Continuous Positive Airway Pressure Ventilation , COPD, former smoker,  02/14/2021 SARS coronavirus NEG   breath sounds clear to auscultation       Cardiovascular hypertension, Pt. on medications (-) angina+ dysrhythmias (now NSR s/p ablation) Atrial Fibrillation  Rhythm:Regular Rate:Normal  '19 ECHO: EF 60-65%. Wall motion was normal; no regional wall motion abnormalities, mild mitral regurgitation.    Neuro/Psych Depression negative neurological ROS     GI/Hepatic Neg liver ROS, GERD  Medicated and Controlled,  Endo/Other  diabetes (no medications)Morbid obesity  Renal/GU negative Renal ROS     Musculoskeletal  (+) Arthritis ,   Abdominal (+) + obese,   Peds  Hematology   Anesthesia Other Findings   Reproductive/Obstetrics                            Anesthesia Physical Anesthesia Plan  ASA: III  Anesthesia Plan: Regional and MAC   Post-op Pain Management:    Induction:   PONV Risk Score and Plan: 2 and Ondansetron and Dexamethasone  Airway Management Planned: Natural Airway and Simple Face Mask  Additional Equipment: None  Intra-op Plan:   Post-operative Plan:   Informed Consent: I have reviewed the patients History and Physical, chart, labs and discussed the procedure including the risks, benefits and alternatives for the proposed anesthesia with the patient or authorized representative who has indicated his/her understanding and acceptance.       Plan Discussed with:  CRNA and Surgeon  Anesthesia Plan Comments:        Anesthesia Quick Evaluation

## 2021-02-14 NOTE — H&P (Signed)
Veronica Hansen is an 75 y.o. female.   Chief Complaint: RIGHT ARM PAIN   HPI:  The patient is a 75 year old right-hand dominant female who fell on 02/12/21 and injured her right arm.  She was seen at [facility] for initial treatment.  She was put into a sugar tong splint after reduction of the fracture was attempted.  She was seen in our office for further evaluation.  Discussed the instability of the fracture and the reason and rationale for surgical intervention. She has been in a sugar tong splint and it was clean and dry. She is here today for surgery. She denies chest pain, shortness of breath, fever, chills, nausea, vomiting, diarrhea.  Past Medical History:  Diagnosis Date  . Anemia   . Arthritis    joints hurt  . COPD (chronic obstructive pulmonary disease) ([place])   . Depression    husband died of Covid 02-22-2020  . Diabetes mellitus    type 2, no longer takes medication   . History of blood transfusion   . Hyperlipidemia   . Hypertension   . Incontinence   . OSA (obstructive sleep apnea)   . OSA on CPAP 03/07/2014  . Runny nose    seasonal - poss allergies  . Sinus problem   . SOB (shortness of breath)     Past Surgical History:  Procedure Laterality Date  . A-FLUTTER ABLATION N/A 08/19/2018   Procedure: A-FLUTTER ABLATION;  Surgeon: Evans Lance, MD;  Location: [facility];  Service: Cardiovascular;  Laterality: N/A;  . dental implants  Feb 21, 2010  . TOTAL KNEE ARTHROPLASTY  2004/02/22   left  . TOTAL KNEE ARTHROPLASTY  02-21-06   right    Family History  Problem Relation Age of Onset  . Diabetes Father   . Diabetes Brother    Social History:  reports that she quit smoking about 20 years ago. She quit after 20.00 years of use. She has never used smokeless tobacco. She reports that she does not drink alcohol and does not use drugs.  Allergies:  Allergies  Allergen Reactions  . Orlistat     Other reaction(s): severe diarrhea  . Phentermine Hcl      Other reaction(s): elevated BP    No medications prior to admission.    Results for orders placed or performed during the hospital encounter of 02/12/21 (from the past 48 hour(s))  CBC with Differential     Status: Abnormal   Collection Time: 02/12/21  9:52 PM  Result Value Ref Range   WBC 10.5 4.0 - 10.5 K/uL   RBC 4.83 3.87 - 5.11 MIL/uL   Hemoglobin 10.4 (L) 12.0 - 15.0 g/dL   HCT 34.1 (L) 36.0 - 46.0 %   MCV 70.6 (L) 80.0 - 100.0 fL   MCH 21.5 (L) 26.0 - 34.0 pg   MCHC 30.5 30.0 - 36.0 g/dL   RDW 17.1 (H) 11.5 - 15.5 %   Platelets 265 150 - 400 K/uL   nRBC 0.0 0.0 - 0.2 %   Neutrophils Relative % 76 %   Neutro Abs 8.1 (H) 1.7 - 7.7 K/uL   Lymphocytes Relative 15 %   Lymphs Abs 1.6 0.7 - 4.0 K/uL   Monocytes Relative 7 %   Monocytes Absolute 0.7 0.1 - 1.0 K/uL   Eosinophils Relative 1 %   Eosinophils Absolute 0.1 0.0 - 0.5 K/uL   Basophils Relative 0 %   Basophils Absolute 0.0 0.0 - 0.1 K/uL  Immature Granulocytes 1 %   Abs Immature Granulocytes 0.06 0.00 - 0.07 K/uL    Comment: Performed at [facility], [address] [place], [place] [address]  Basic metabolic panel     Status: Abnormal   Collection Time: 02/12/21  9:52 PM  Result Value Ref Range   Sodium 137 135 - 145 mmol/L   Potassium 3.9 3.5 - 5.1 mmol/L   Chloride 104 98 - 111 mmol/L   CO2 24 22 - 32 mmol/L   Glucose, Bld 150 (H) 70 - 99 mg/dL    Comment: Glucose reference range applies only to samples taken after fasting for at least 8 hours.   BUN 11 8 - 23 mg/dL   Creatinine, Ser 0.50 0.44 - 1.00 mg/dL   Calcium 9.2 8.9 - 10.3 mg/dL   GFR, Estimated >60 >60 mL/min    Comment: (NOTE) Calculated using the CKD-EPI Creatinine Equation (2021)    Anion gap 9 5 - 15    Comment: Performed at [facility], [address] [place], [place] [address]   DG Wrist Complete Right  Result Date: 02/13/2021 CLINICAL DATA:  Right wrist fracture, post reduction imaging EXAM: RIGHT WRIST -  COMPLETE 3+ VIEW COMPARISON:  9:12 p.m. FINDINGS: Three view radiograph of the a right wrist is limited by obliquity on lateral examination. External immobilizer has been applied. A a comminuted transverse fracture of the distal right radius is again identified with 1 shaft with volar displacement of the distal fracture fragment. Previously noted volar tilt of the distal radial articular surface appears to have been partial collected with now neutral tilt of the a distal radial articular surface. Loss of radial inclination again noted. Resultant mild ulnar positive variance again noted. Displaced fracture of the base of the ulnar styloid again noted. IMPRESSION: Comminuted fracture of the distal right radius with mild correction of distal radial volar tilt, but persistent volar displacement, loss of radial inclination, and mild resultant ulnar positive variance. Stable displaced fracture of the base of the ulnar styloid. Electronically Signed   By: Fidela Salisbury MD   On: 02/13/2021 00:27   DG Wrist Complete Right  Result Date: 02/12/2021 CLINICAL DATA:  Tripped and fell EXAM: RIGHT WRIST - COMPLETE 3+ VIEW COMPARISON:  None. FINDINGS: Acute displaced ulnar styloid fracture. Acute comminuted intra-articular distal radius fracture with close to 1 shaft diameter volar displacement of distal fracture fragment and about 12 mm of overriding. Distal radioulnar joint appears widened. There is soft tissue deformity. IMPRESSION: 1. Acute comminuted and displaced intra-articular distal radius fracture with overriding. 2. Acute displaced ulnar styloid fracture. Electronically Signed   By: Donavan Foil M.D.   On: 02/12/2021 21:39   CT Head Wo Contrast  Result Date: 02/12/2021 CLINICAL DATA:  Tripped and fell, head trauma EXAM: CT HEAD WITHOUT CONTRAST TECHNIQUE: Contiguous axial images were obtained from the base of the skull through the vertex without intravenous contrast. COMPARISON:  None. FINDINGS: Brain: No acute  infarct or hemorrhage. Lateral ventricles and midline structures are unremarkable. No acute extra-axial fluid collections. No mass effect. Vascular: No hyperdense vessel or unexpected calcification. Skull: Normal. Negative for fracture or focal lesion. Sinuses/Orbits: No acute finding. Other: Right periorbital soft tissue swelling. IMPRESSION: 1. Right periorbital soft tissue swelling. 2. No acute intracranial process. Electronically Signed   By: Randa Ngo M.D.   On: 02/12/2021 22:23   CT Cervical Spine Wo Contrast  Result Date: 02/12/2021 CLINICAL DATA:  Tripped and  fell, trauma EXAM: CT CERVICAL SPINE WITHOUT CONTRAST TECHNIQUE: Multidetector CT imaging of the cervical spine was performed without intravenous contrast. Multiplanar CT image reconstructions were also generated. COMPARISON:  None. FINDINGS: Alignment: Alignment is anatomic. Skull base and vertebrae: No acute fracture. No primary bone lesion or focal pathologic process. Soft tissues and spinal canal: No prevertebral fluid or swelling. No visible canal hematoma. Disc levels: Mild diffuse cervical spondylosis most pronounced at C4-5, C5-6, and C6-7. Facet hypertrophic changes are most pronounced at C2-3 and C3-4. Left predominant neural foraminal narrowing at C4-5 and C6-7. Upper chest: Airway is patent.  Lung apices are clear. Other: Reconstructed images demonstrate no additional findings. IMPRESSION: 1. No acute cervical spine fracture. 2. Multilevel cervical spondylosis and facet hypertrophy, greatest at C4-5 and C6-7. Electronically Signed   By: Randa Ngo M.D.   On: 02/12/2021 22:27   CT Maxillofacial WO CM  Result Date: 02/12/2021 CLINICAL DATA:  Tripped and fell, trauma EXAM: CT MAXILLOFACIAL WITHOUT CONTRAST TECHNIQUE: Multidetector CT imaging of the maxillofacial structures was performed. Multiplanar CT image reconstructions were also generated. COMPARISON:  None. FINDINGS: Osseous: There is a minimally displaced right orbital  floor fracture. No evidence of extraocular muscle entrapment. No other acute or destructive bony lesions. Orbits: Mild edema within the extraconal fat inferior right orbit. The globes and extraocular muscles are intact. Optic nerves are unremarkable. Sinuses: Clear. Soft tissues: Prominent right periorbital soft tissue swelling. Remaining soft tissues are normal. Limited intracranial: No significant or unexpected finding. IMPRESSION: 1. Minimally displaced right orbital floor fracture. No extraocular muscle entrapment. 2. Right periorbital soft tissue swelling. Electronically Signed   By: Randa Ngo M.D.   On: 02/12/2021 22:25    ROS NO RECENT ILLNESSES OR HOSPITALIZATIONS  Height 5\' 7"  (1.702 m), weight 131.5 kg. Physical Exam  General Appearance:  Alert, cooperative, no distress, appears stated age  Head:  Normocephalic, without obvious abnormality, atraumatic  Eyes:  Pupils equal, conjunctiva/corneas clear,         Throat: Lips, mucosa, and tongue normal; teeth and gums normal  Neck: No visible masses     Lungs:   respirations unlabored  Chest Wall:  No tenderness or deformity  Heart:  Regular rate and rhythm,  Abdomen:   Soft, non-tender,         Extremities: RUE - MODERATE SWELLING AND ECCHYMOSIS THROUGHOUT THE EXTREMITY.  PUNCTATE WOUND AT THE DISTAL ULNA WITHOUT ERYTHEMA OR PURULENT DRAINAGE.  SENSATION INTACT TO LIGHT TOUCH DISTALLY.  CAPILLARY REFILL LESS THAN 2 SECONDS.  ABLE TO GENTLY WIGGLE ALL DIGITS.  LIMITED MOTION OF THE WRIST DUE TO PAIN.  Pulses: 2+ and symmetric  Skin: Skin color, texture, turgor normal, no rashes or lesions     Neurologic: Normal    Assessment/Plan RIGHT DISTAL RADIUS AND ULNA DISPLACED COMMINUTED FRACTURE    - RIGHT DISTAL RADIUS AND ULNA OPEN REDUCTION AND INTERNAL FIXATION WITH REPAIR AS INDICATED  R/B/A DISCUSSED WITH PT IN OFFICE.  PT VOICED UNDERSTANDING OF PLAN CONSENT SIGNED DAY OF SURGERY PT SEEN AND EXAMINED PRIOR TO OPERATIVE  PROCEDURE/DAY OF SURGERY SITE MARKED. QUESTIONS ANSWERED WILL GO HOME FOLLOWING SURGERY  WE ARE PLANNING SURGERY FOR YOUR UPPER EXTREMITY. THE RISKS AND BENEFITS OF SURGERY INCLUDE BUT NOT LIMITED TO BLEEDING INFECTION, DAMAGE TO NEARBY NERVES ARTERIES TENDONS, FAILURE OF SURGERY TO ACCOMPLISH ITS INTENDED GOALS, PERSISTENT SYMPTOMS AND NEED FOR FURTHER SURGICAL INTERVENTION. WITH THIS IN MIND WE WILL PROCEED. I HAVE DISCUSSED WITH THE PATIENT THE PRE AND POSTOPERATIVE REGIMEN  AND THE DOS AND DON'TS. PT VOICED UNDERSTANDING AND INFORMED CONSENT SIGNED.  Kalman Nylen [facility] 02/15/2021   Brynda Peon 02/14/2021, 12:50 PM

## 2021-02-15 ENCOUNTER — Other Ambulatory Visit: Payer: Self-pay

## 2021-02-15 ENCOUNTER — Ambulatory Visit (HOSPITAL_COMMUNITY): Payer: Medicare Other | Admitting: Anesthesiology

## 2021-02-15 ENCOUNTER — Ambulatory Visit (HOSPITAL_COMMUNITY): Payer: Medicare Other

## 2021-02-15 ENCOUNTER — Encounter (HOSPITAL_COMMUNITY)
Admission: RE | Disposition: A | Discharge status: Home / Self Care | Payer: Self-pay | Source: Home / Self Care | Attending: Orthopedic Surgery

## 2021-02-15 ENCOUNTER — Ambulatory Visit (HOSPITAL_COMMUNITY)
Admission: RE | Admit: 2021-02-15 | Discharge: 2021-02-15 | Disposition: A | Discharge status: Home / Self Care | Payer: Medicare Other | Attending: Orthopedic Surgery | Admitting: Orthopedic Surgery

## 2021-02-15 ENCOUNTER — Encounter (HOSPITAL_COMMUNITY): Payer: Self-pay | Admitting: Orthopedic Surgery

## 2021-02-15 DIAGNOSIS — Z888 Allergy status to other drugs, medicaments and biological substances status: Secondary | ICD-10-CM | POA: Insufficient documentation

## 2021-02-15 DIAGNOSIS — S0231XA Fracture of orbital floor, right side, initial encounter for closed fracture: Secondary | ICD-10-CM | POA: Insufficient documentation

## 2021-02-15 DIAGNOSIS — Z87891 Personal history of nicotine dependence: Secondary | ICD-10-CM | POA: Diagnosis not present

## 2021-02-15 DIAGNOSIS — M47812 Spondylosis without myelopathy or radiculopathy, cervical region: Secondary | ICD-10-CM | POA: Insufficient documentation

## 2021-02-15 DIAGNOSIS — S52571A Other intraarticular fracture of lower end of right radius, initial encounter for closed fracture: Secondary | ICD-10-CM | POA: Diagnosis not present

## 2021-02-15 DIAGNOSIS — S52611A Displaced fracture of right ulna styloid process, initial encounter for closed fracture: Secondary | ICD-10-CM | POA: Insufficient documentation

## 2021-02-15 DIAGNOSIS — W010XXA Fall on same level from slipping, tripping and stumbling without subsequent striking against object, initial encounter: Secondary | ICD-10-CM | POA: Diagnosis not present

## 2021-02-15 DIAGNOSIS — S52501A Unspecified fracture of the lower end of right radius, initial encounter for closed fracture: Secondary | ICD-10-CM

## 2021-02-15 HISTORY — DX: Personal history of other medical treatment: Z92.89

## 2021-02-15 HISTORY — DX: Depression, unspecified: F32.A

## 2021-02-15 HISTORY — DX: Anemia, unspecified: D64.9

## 2021-02-15 HISTORY — DX: Chronic obstructive pulmonary disease, unspecified: J44.9

## 2021-02-15 HISTORY — DX: Dyspnea, unspecified: R06.00

## 2021-02-15 HISTORY — PX: ORIF ULNAR FRACTURE: SHX5417

## 2021-02-15 HISTORY — DX: Gastro-esophageal reflux disease without esophagitis: K21.9

## 2021-02-15 LAB — GLUCOSE, CAPILLARY
Glucose-Capillary: 113 mg/dL — ABNORMAL HIGH (ref 70–99)
Glucose-Capillary: 121 mg/dL — ABNORMAL HIGH (ref 70–99)

## 2021-02-15 LAB — SARS CORONAVIRUS 2 (TAT 6-24 HRS): SARS Coronavirus 2: NEGATIVE

## 2021-02-15 SURGERY — OPEN REDUCTION INTERNAL FIXATION (ORIF) ULNAR FRACTURE
Anesthesia: Monitor Anesthesia Care | Site: Wrist | Laterality: Right

## 2021-02-15 MED ORDER — DEXAMETHASONE SODIUM PHOSPHATE 10 MG/ML IJ SOLN
INTRAMUSCULAR | Status: DC | PRN
  Administered 2021-02-15: 5 mg via INTRAVENOUS

## 2021-02-15 MED ORDER — LACTATED RINGERS IV SOLN: INTRAVENOUS | Status: DC

## 2021-02-15 MED ORDER — MEPERIDINE HCL 25 MG/ML IJ SOLN: 6.2500 mg | INTRAMUSCULAR | Status: DC | PRN

## 2021-02-15 MED ORDER — ORAL CARE MOUTH RINSE: 15.0000 mL | Freq: Once | OROMUCOSAL | Status: AC

## 2021-02-15 MED ORDER — PROMETHAZINE HCL 25 MG/ML IJ SOLN: 6.2500 mg | INTRAMUSCULAR | Status: DC | PRN

## 2021-02-15 MED ORDER — FENTANYL CITRATE (PF) 250 MCG/5ML IJ SOLN
INTRAMUSCULAR | Status: DC | PRN
  Administered 2021-02-15: 25 ug via INTRAVENOUS
  Administered 2021-02-15: 50 ug via INTRAVENOUS
  Administered 2021-02-15: 25 ug via INTRAVENOUS

## 2021-02-15 MED ORDER — CHLORHEXIDINE GLUCONATE 0.12 % MT SOLN
OROMUCOSAL | Status: AC
  Administered 2021-02-15: 15 mL via OROMUCOSAL
  Filled 2021-02-15: qty 15

## 2021-02-15 MED ORDER — ONDANSETRON HCL 4 MG/2ML IJ SOLN
INTRAMUSCULAR | Status: DC | PRN
  Administered 2021-02-15: 4 mg via INTRAVENOUS

## 2021-02-15 MED ORDER — HYDROMORPHONE HCL 1 MG/ML IJ SOLN: 0.2500 mg | INTRAMUSCULAR | Status: DC | PRN

## 2021-02-15 MED ORDER — MIDAZOLAM HCL 2 MG/2ML IJ SOLN
INTRAMUSCULAR | Status: AC
  Filled 2021-02-15: qty 2

## 2021-02-15 MED ORDER — PROPOFOL 500 MG/50ML IV EMUL
INTRAVENOUS | Status: DC | PRN
  Administered 2021-02-15: 100 ug/kg/min via INTRAVENOUS

## 2021-02-15 MED ORDER — MIDAZOLAM HCL 2 MG/2ML IJ SOLN
INTRAMUSCULAR | Status: DC | PRN
  Administered 2021-02-15: 1 mg via INTRAVENOUS

## 2021-02-15 MED ORDER — 0.9 % SODIUM CHLORIDE (POUR BTL) OPTIME
TOPICAL | Status: DC | PRN
  Administered 2021-02-15: 1000 mL

## 2021-02-15 MED ORDER — OXYCODONE HCL 5 MG PO TABS
5.0000 mg | ORAL_TABLET | Freq: Once | ORAL | Status: DC | PRN
Start: 2021-02-15 — End: 2021-02-15

## 2021-02-15 MED ORDER — BUPIVACAINE-EPINEPHRINE (PF) 0.5% -1:200000 IJ SOLN
INTRAMUSCULAR | Status: DC | PRN
  Administered 2021-02-15: 30 mL via PERINEURAL

## 2021-02-15 MED ORDER — OXYCODONE HCL 5 MG/5ML PO SOLN
5.0000 mg | Freq: Once | ORAL | Status: DC | PRN
Start: 2021-02-15 — End: 2021-02-15

## 2021-02-15 MED ORDER — VANCOMYCIN HCL 1000 MG IV SOLR
INTRAVENOUS | Status: AC
  Filled 2021-02-15: qty 1000

## 2021-02-15 MED ORDER — ACETAMINOPHEN 500 MG PO TABS
1000.0000 mg | ORAL_TABLET | Freq: Once | ORAL | Status: AC
  Administered 2021-02-15: 1000 mg via ORAL
  Filled 2021-02-15: qty 2

## 2021-02-15 MED ORDER — FENTANYL CITRATE (PF) 250 MCG/5ML IJ SOLN
INTRAMUSCULAR | Status: AC
  Filled 2021-02-15: qty 5

## 2021-02-15 MED ORDER — LACTATED RINGERS IV SOLN: INTRAVENOUS | Status: DC | PRN

## 2021-02-15 MED ORDER — OXYCODONE-ACETAMINOPHEN 10-325 MG PO TABS: 1.0000 | ORAL_TABLET | Freq: Four times a day (QID) | ORAL | 0 refills | Status: DC | PRN

## 2021-02-15 MED ORDER — BUPIVACAINE HCL (PF) 0.25 % IJ SOLN
INTRAMUSCULAR | Status: AC
  Filled 2021-02-15: qty 30

## 2021-02-15 MED ORDER — DEXTROSE 5 % IV SOLN
3.0000 g | INTRAVENOUS | Status: AC
Start: 2021-02-15 — End: 2021-02-15
  Administered 2021-02-15: 3 g via INTRAVENOUS
  Filled 2021-02-15: qty 3

## 2021-02-15 MED ORDER — CHLORHEXIDINE GLUCONATE 0.12 % MT SOLN: 15.0000 mL | Freq: Once | OROMUCOSAL | Status: AC

## 2021-02-15 MED ORDER — PHENYLEPHRINE HCL-NACL 10-0.9 MG/250ML-% IV SOLN
INTRAVENOUS | Status: DC | PRN
  Administered 2021-02-15: 20 ug/min via INTRAVENOUS

## 2021-02-15 MED ORDER — MIDAZOLAM HCL 2 MG/2ML IJ SOLN
0.5000 mg | Freq: Once | INTRAMUSCULAR | Status: DC | PRN
Start: 2021-02-15 — End: 2021-02-15

## 2021-02-15 SURGICAL SUPPLY — 59 items
BIT DRILL 2.2 SS TIBIAL (BIT) ×2 IMPLANT
BNDG ELASTIC 3X5.8 VLCR STR LF (GAUZE/BANDAGES/DRESSINGS) ×2 IMPLANT
BNDG ELASTIC 4X5.8 VLCR STR LF (GAUZE/BANDAGES/DRESSINGS) ×2 IMPLANT
BNDG ESMARK 4X9 LF (GAUZE/BANDAGES/DRESSINGS) ×2 IMPLANT
BNDG GAUZE ELAST 4 BULKY (GAUZE/BANDAGES/DRESSINGS) ×2 IMPLANT
CORD BIPOLAR FORCEPS 12FT (ELECTRODE) ×2 IMPLANT
COVER SURGICAL LIGHT HANDLE (MISCELLANEOUS) ×2 IMPLANT
CUFF TOURN SGL QUICK 24 (TOURNIQUET CUFF) ×2
CUFF TRNQT CYL 24X4X16.5-23 (TOURNIQUET CUFF) ×1 IMPLANT
DRAPE OEC MINIVIEW 54X84 (DRAPES) ×2 IMPLANT
DRAPE SURG 17X11 SM STRL (DRAPES) ×2 IMPLANT
DRSG ADAPTIC 3X8 NADH LF (GAUZE/BANDAGES/DRESSINGS) ×2 IMPLANT
ELECT REM PT RETURN 9FT ADLT (ELECTROSURGICAL)
ELECTRODE REM PT RTRN 9FT ADLT (ELECTROSURGICAL) IMPLANT
GAUZE SPONGE 4X4 12PLY STRL (GAUZE/BANDAGES/DRESSINGS) ×2 IMPLANT
GLOVE BIOGEL PI IND STRL 8.5 (GLOVE) ×2 IMPLANT
GLOVE BIOGEL PI INDICATOR 8.5 (GLOVE) ×2
GLOVE SURG ORTHO 8.0 STRL STRW (GLOVE) ×6 IMPLANT
GOWN STRL REUS W/ TWL LRG LVL3 (GOWN DISPOSABLE) ×1 IMPLANT
GOWN STRL REUS W/ TWL XL LVL3 (GOWN DISPOSABLE) ×1 IMPLANT
GOWN STRL REUS W/TWL LRG LVL3 (GOWN DISPOSABLE) ×2
GOWN STRL REUS W/TWL XL LVL3 (GOWN DISPOSABLE) ×2
K-WIRE 1.6 (WIRE) ×4
K-WIRE FX5X1.6XNS BN SS (WIRE) ×2
KIT BASIN OR (CUSTOM PROCEDURE TRAY) ×2 IMPLANT
KIT TURNOVER KIT B (KITS) ×2 IMPLANT
KWIRE FX5X1.6XNS BN SS (WIRE) ×2 IMPLANT
MANIFOLD NEPTUNE II (INSTRUMENTS) ×2 IMPLANT
NEEDLE HYPO 25X1 1.5 SAFETY (NEEDLE) ×2 IMPLANT
NS IRRIG 1000ML POUR BTL (IV SOLUTION) ×2 IMPLANT
PACK ORTHO EXTREMITY (CUSTOM PROCEDURE TRAY) ×2 IMPLANT
PAD ARMBOARD 7.5X6 YLW CONV (MISCELLANEOUS) ×4 IMPLANT
PAD CAST 4YDX4 CTTN HI CHSV (CAST SUPPLIES) ×3 IMPLANT
PADDING CAST COTTON 4X4 STRL (CAST SUPPLIES) ×6
PEG LOCKING SMOOTH 2.2X13 (Peg) ×2 IMPLANT
PEG LOCKING SMOOTH 2.2X22 (Screw) ×2 IMPLANT
PEG LOCKING SMOOTH 2.2X24 (Peg) ×4 IMPLANT
PEG LOCKING SMOOTH 2.2X26 (Peg) ×4 IMPLANT
PLATE WIDE DVR RIGHT (Plate) ×2 IMPLANT
PUTTY DBM STAGRAFT PLUS 2CC (Putty) ×2 IMPLANT
SCREW LOCK 16X2.7X 3 LD TPR (Screw) ×4 IMPLANT
SCREW LOCK 20X2.7X 3 LD TPR (Screw) ×2 IMPLANT
SCREW LOCK 22X2.7X 3 LD TPR (Screw) ×1 IMPLANT
SCREW LOCKING 2.7X15MM (Screw) ×2 IMPLANT
SCREW LOCKING 2.7X16 (Screw) ×8 IMPLANT
SCREW LOCKING 2.7X20MM (Screw) ×4 IMPLANT
SCREW LOCKING 2.7X22MM (Screw) ×2 IMPLANT
SOAP 2 % CHG 4 OZ (WOUND CARE) ×2 IMPLANT
SPLINT FIBERGLASS 3X35 (CAST SUPPLIES) ×2 IMPLANT
SUT PROLENE 3 0 PS 2 (SUTURE) ×6 IMPLANT
SUT PROLENE 4 0 PS 2 18 (SUTURE) IMPLANT
SUT VIC AB 2-0 CT1 27 (SUTURE) ×2
SUT VIC AB 2-0 CT1 TAPERPNT 27 (SUTURE) ×1 IMPLANT
SUT VIC AB 4-0 PS2 18 (SUTURE) ×4 IMPLANT
SYR CONTROL 10ML LL (SYRINGE) ×2 IMPLANT
TOWEL GREEN STERILE (TOWEL DISPOSABLE) ×2 IMPLANT
TOWEL GREEN STERILE FF (TOWEL DISPOSABLE) ×2 IMPLANT
TUBE CONNECTING 12X1/4 (SUCTIONS) ×2 IMPLANT
WATER STERILE IRR 1000ML POUR (IV SOLUTION) IMPLANT

## 2021-02-15 NOTE — Anesthesia Postprocedure Evaluation (Signed)
Anesthesia Post Note  Patient: Veronica Hansen  Procedure(s) Performed: OPEN REDUCTION INTERNAL FIXATION (ORIF) ULNAR FRACTURE and distal radius open reduction and internal fixation and repair (Right Wrist)     Patient location during evaluation: PACU Anesthesia Type: Regional and MAC Level of consciousness: awake and alert, patient cooperative and oriented Pain management: pain level controlled Vital Signs Assessment: post-procedure vital signs reviewed and stable Respiratory status: nonlabored ventilation, spontaneous breathing and respiratory function stable Cardiovascular status: stable and blood pressure returned to baseline Postop Assessment: no apparent nausea or vomiting and able to ambulate Anesthetic complications: no   No complications documented.  Last Vitals:  Vitals:   02/15/21 0935 02/15/21 0950  BP: 124/62 122/70  Pulse: 64 62  Resp: 12 12  Temp: (!) 36.4 C   SpO2: 97% 94%    Last Pain:  Vitals:   02/15/21 0950  TempSrc:   PainSc: 0-No pain                 Russel Morain,E. Juelz Whittenberg

## 2021-02-15 NOTE — Op Note (Signed)
PREOPERATIVE DIAGNOSIS: Right wrist comminuted intra-articular distal radius fracture 3 more fragments Right wrist ulnar styloid fracture  POSTOPERATIVE DIAGNOSIS: Same  ATTENDING SURGEON: Dr. Iran Planas who scrubbed and present for the entire procedure  ASSISTANT SURGEON: None  ANESTHESIA: Regional with IV sedation  OPERATIVE PROCEDURE: #1: Open treatment of right wrist intra-articular distal radius fracture 3 more fragments requiring internal fixation #2: Right wrist brachial radialis tendon release, tendon tenotomy #3: Right wrist radiographs 3 views  IMPLANTS: Biomet DVR cross lock wide with 2 cc stay graft bone graft substitute  EBL: Minimal  RADIOGRAPHIC INTERPRETATION: AP lateral and oblique views of the wrist do show the volar plate fixation in place with good restoration of the radial height inclination and tilt  SURGICAL INDICATIONS: Patient is a right-hand-dominant female who sustained a closed injury to her right distal radius.  Patient was seen evaluate the office and recommended undergo the above procedure.  The risks of surgery include but not limited to bleeding infection damage nearby nerves arteries or tendons loss of motion of the wrist and digits incomplete relief of symptoms and need for further surgical invention.  SURGICAL TECHNIQUE: Patient was palpated via the preoperative holding area marked apart a marker made on the right wrist indicate correct operative site.  Patient brought back operating placed supine on anesthesia table where the regional anesthetic was administered.  Patient tolerated the procedure well.  Well-padded tourniquet placed on the right brachium and seal with appropriate drape.  The right upper extremities then prepped and draped normal sterile fashion.  A timeout was called the correct site was identified and the procedure then begun.  Attention was then turned to the right wrist.  A longitudinal incision made directly over the FCR after the  tourniquet insufflated.  Dissection carried down through the skin and subcutaneous tissue.  Subcutaneous veins were cauterized with the bipolar cautery.  The FCR sheath was opened proximally and distally.  Going through the floor the FCR sheath the FPL was then carefully swept out of the way and the pronator quadratus was identified.  What remained of the pronator quadratus was then elevated in an L-shaped fashion.  Fracture site exposure was then carried out.  In order to reduce the radial, separate intervention was then carried out to release the brachial radialis off the radial styloid.  Tendon tenotomy was then carried out in order to expose the radial column and expose the intra-articular fracture along the radial column.  After 2 separate intervention the wound was then thoroughly irrigated patient did have a high degree of metaphyseal comminution.  Bone graft substitute was then packed into this area and loose bone fragments were then removed from the cortical pieces that did not have any soft tissue attachment.  Following this the volar plate was then applied.  This is a wide plate.  It was held distally with a K wire.  Plate position was then confirmed.  The oblong screw hole was then placed proximally.  Traction was then elevated and the reduction was then performed getting the reduction back out to length.  After adequate reduction especially along the ulnar column the oblong screw and then tightened down.  Following this distal fixation was then carried out with a combination of distal locking pegs and screws.  Final fixation was then carried out along the proximal segment with a combination of locking screws and nonlocking screws.  Final radiographs were then obtained.  Remaining portion of the bone graft was then packed into the area.  The pronator quadratus was then closed with 2-0 Vicryl.  The subcutaneous tissues closed with 4-0 Vicryl and the skin closed with simple Prolene sutures.  Assessment of  the distal radial ulnar joint was then carried out.  The patient did have the ulnar styloid fracture.  The patient did not have a full-thickness wound over the ulnar aspect of the wrist there was a partial thickness or abrasion directly over the ulnar styloid.  There is no instability with stress testing of the distal radial ulnar joint therefore the patient was then placed in a sterile compressive bandage Adaptic over the wounds and placed in a well-padded sugar tong splint.  Patient was then taken recovery room in good condition.  POSTOPERATIVE PLAN: Patient be discharged to home.  See him back in the office in 2 weeks for wound check suture removal x-rays application of a short arm cast placed the therapy order begin at the 4-week mark.  Radiographs at each visit.

## 2021-02-15 NOTE — Anesthesia Procedure Notes (Signed)
Anesthesia Regional Block: Supraclavicular block   Pre-Anesthetic Checklist: ,, timeout performed, Correct Patient, Correct Site, Correct Laterality, Correct Procedure, Correct Position, site marked, Risks and benefits discussed,  Surgical consent,  Pre-op evaluation,  At surgeon's request and post-op pain management  Laterality: Right and Upper  Prep: chloraprep       Needles:  Injection technique: Single-shot     Needle Length: 9cm  Needle Gauge: 21     Additional Needles:   Procedures:,,,, ultrasound used (permanent image in chart),,,,  Narrative:  Start time: 02/15/2021 7:22 AM End time: 02/15/2021 7:28 AM Injection made incrementally with aspirations every 5 mL.  Performed by: Personally  Anesthesiologist: Annye Asa, MD  Additional Notes: Pt identified in Holding room.  Monitors applied. Working IV access confirmed. Sterile prep R clavicle and neck.  #21ga ECHOgenic Arrow block needle to supraclav brachial plexus with US guidance.  30cc 0.5% Bupivacaine with 1:200k epi injected incrementally after negative test dose.  Patient asymptomatic, VSS, no heme aspirated, tolerated well.  Jenita Seashore, MD

## 2021-02-15 NOTE — Discharge Instructions (Signed)
KEEP BANDAGE CLEAN AND DRY CALL OFFICE FOR F/U APPT [phone] IN 13 DAYS KEEP HAND ELEVATED ABOVE HEART OK TO APPLY ICE TO OPERATIVE AREA CONTACT OFFICE IF ANY WORSENING PAIN OR CONCERNS.  

## 2021-02-15 NOTE — Transfer of Care (Signed)
Immediate Anesthesia Transfer of Care Note  Patient: Veronica Hansen  Procedure(s) Performed: OPEN REDUCTION INTERNAL FIXATION (ORIF) ULNAR FRACTURE and distal radius open reduction and internal fixation and repair (Right Wrist)  Patient Location: PACU  Anesthesia Type:MAC combined with regional for post-op pain  Level of Consciousness: awake, alert  and patient cooperative  Airway & Oxygen Therapy: Patient Spontanous Breathing and Patient connected to face mask oxygen  Post-op Assessment: Report given to RN and Post -op Vital signs reviewed and stable  Post vital signs: Reviewed and stable  Last Vitals:  Vitals Value Taken Time  BP 124/62 02/15/21 0934  Temp    Pulse 64 02/15/21 0935  Resp 12 02/15/21 0935  SpO2 97 % 02/15/21 0935  Vitals shown include unvalidated device data.  Last Pain:  Vitals:   02/15/21 0650  TempSrc: Oral  PainSc: 0-No pain         Complications: No complications documented.

## 2021-02-17 ENCOUNTER — Encounter (HOSPITAL_COMMUNITY): Payer: Self-pay | Admitting: Orthopedic Surgery

## 2021-03-21 ENCOUNTER — Encounter: Payer: Self-pay | Admitting: *Deleted

## 2021-03-21 ENCOUNTER — Other Ambulatory Visit: Payer: Self-pay | Admitting: *Deleted

## 2021-03-21 NOTE — Progress Notes (Signed)
Called patient and provided directions to clinic, must wear mask and can have 1 visitor for the appointment. Explained how to come to office from parking deck. Reconciled med list and confirmed pharmacy as [facility].  Mrs. Asbill live in [place] with daughter currently staying with her. Her husband died in 2020-02-29 from [place]. She is a Forensic psychologist, originally from [place] and has lived in [place] for 22 years. 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 She reports she quit smoking in about 02/28/2006. Rarely drinks alcohol. Recent surgery to repair right wrist fracture on 02/15/21 due to fall on 02/12/21. Medical history reviewed and updated. Reports what [facility] was rectal urgency with incontinence. Denies any recent EGD or colonoscopy. Her primary care MD is Dr. Leanna Battles. Left message w/[facility] requesting they fax the CBC/diff, Ferritin and iron panel results.

## 2021-03-24 ENCOUNTER — Inpatient Hospital Stay: Payer: Medicare Other

## 2021-03-24 ENCOUNTER — Other Ambulatory Visit: Payer: Self-pay

## 2021-03-24 ENCOUNTER — Inpatient Hospital Stay: Payer: Medicare Other | Attending: Oncology | Admitting: Oncology

## 2021-03-24 VITALS — BP 139/81 | HR 63 | Temp 98.3°F | Resp 20 | Wt 299.0 lb

## 2021-03-24 DIAGNOSIS — D509 Iron deficiency anemia, unspecified: Secondary | ICD-10-CM | POA: Insufficient documentation

## 2021-03-24 DIAGNOSIS — D508 Other iron deficiency anemias: Secondary | ICD-10-CM | POA: Diagnosis not present

## 2021-03-24 LAB — IRON AND TIBC
Iron: 58 ug/dL (ref 28–170)
Saturation Ratios: 10 % — ABNORMAL LOW (ref 10.4–31.8)
TIBC: 566 ug/dL — ABNORMAL HIGH (ref 250–450)
UIBC: 508 ug/dL

## 2021-03-24 LAB — FERRITIN: Ferritin: 18 ng/mL (ref 11–307)

## 2021-03-24 LAB — CBC WITH DIFFERENTIAL (CANCER CENTER ONLY)
Abs Immature Granulocytes: 0.05 10*3/uL (ref 0.00–0.07)
Basophils Absolute: 0.1 10*3/uL (ref 0.0–0.1)
Basophils Relative: 1 %
Eosinophils Absolute: 0.2 10*3/uL (ref 0.0–0.5)
Eosinophils Relative: 2 %
HCT: 37.3 % (ref 36.0–46.0)
Hemoglobin: 11 g/dL — ABNORMAL LOW (ref 12.0–15.0)
Immature Granulocytes: 0 %
Lymphocytes Relative: 30 %
Lymphs Abs: 3.4 10*3/uL (ref 0.7–4.0)
MCH: 21.2 pg — ABNORMAL LOW (ref 26.0–34.0)
MCHC: 29.5 g/dL — ABNORMAL LOW (ref 30.0–36.0)
MCV: 71.9 fL — ABNORMAL LOW (ref 80.0–100.0)
Monocytes Absolute: 0.9 10*3/uL (ref 0.1–1.0)
Monocytes Relative: 8 %
Neutro Abs: 7 10*3/uL (ref 1.7–7.7)
Neutrophils Relative %: 59 %
Platelet Count: 304 10*3/uL (ref 150–400)
RBC: 5.19 MIL/uL — ABNORMAL HIGH (ref 3.87–5.11)
RDW: 18.4 % — ABNORMAL HIGH (ref 11.5–15.5)
WBC Count: 11.5 10*3/uL — ABNORMAL HIGH (ref 4.0–10.5)
nRBC: 0 % (ref 0.0–0.2)

## 2021-03-24 LAB — SAVE SMEAR(SSMR), FOR PROVIDER SLIDE REVIEW

## 2021-03-24 NOTE — Progress Notes (Signed)
Veronica Patient Consult   Requesting MD: Leanna Battles, [address],  [place] [id number]   Veronica Hansen 75 y.o.  08/23/1946    Reason for Consult: Iron deficiency anemia   HPI: Ms. Veronica Hansen reports being diagnosed with iron deficiency anemia in 12-Mar-2015 when she was undergoing evaluation to be a stem cell donor for her brother.  She underwent an upper endoscopy and colonoscopy by Dr. Amedeo Plenty in November 2016.  The colonoscopy was normal and the upper endoscopy showed a small hiatal hernia.  A colonoscopy in 11-Mar-2012 found a 6 mm tubular adenoma in the transverse colon.  She saw Dr. Sharlett Iles on 02/06/2021 and the hemoglobin returned at 11 with a low MCV.  A "hemosure "was negative.  She was referred to gastroenterology for further evaluation.  Labs from [facility] on 03/10/2021 included a hemoglobin of 10.5, MCV 67.4, MCH 21.3, RDW 18.4%, platelets 290,000, white count 9.4, ANC 5.1, absolute site count 3.0.  The ferritin returned at 23.4, serum iron 25, TIBC 534, percent transferrin saturation 5.  She had been started on Ferrex daily when she saw Dr. Sharlett Iles.  Veronica Hansen denies bleeding.  Past Medical History:  Diagnosis Date  . Anemia  Mar 12, 2015  . Arthritis    joints hurt  . COPD (chronic obstructive pulmonary disease) ([place])   . Depression    husband died of Covid 03-11-20  . Diabetes mellitus    type 2, no longer takes medication   . Dyspnea   . GERD (gastroesophageal reflux disease)   . History of blood transfusion- first pregnancy  1978/03/11  . Hyperlipidemia   . Hypertension   . Incontinence   . OSA (obstructive sleep apnea)   . OSA on CPAP 03/07/2014  . Runny nose    seasonal - poss allergies  . Sinus problem   . SOB (shortness of breath)     .  G4, P4  Past Surgical History:  Procedure Laterality Date  . A-FLUTTER ABLATION N/A 08/19/2018   Procedure: A-FLUTTER ABLATION;  Surgeon: Evans Lance, MD;  Location: [facility];  Service:  Cardiovascular;  Laterality: N/A;  . dental implants  11-Mar-2010  . ORIF ULNAR FRACTURE Right 02/15/2021   Procedure: OPEN REDUCTION INTERNAL FIXATION (ORIF) ULNAR FRACTURE and distal radius open reduction and internal fixation and repair;  Surgeon: Iran Planas, MD;  Location: [facility];  Service: Orthopedics;  Laterality: Right;  with IV sedation  . TOTAL KNEE ARTHROPLASTY  03/11/2004   left  . TOTAL KNEE ARTHROPLASTY  03-11-06   right    Medications: Reviewed  Allergies:  Allergies  Allergen Reactions  . Orlistat     Other reaction(s): severe diarrhea  . Phentermine Hcl     Other reaction(s): elevated BP    Family history: No family history of hematologic condition.  Her brother died of leukemia at age 36.  Social History:   She lives in [place].  She is a widow.  She is a retired Optometrist.  She quit smoking cigarettes in March 11, 2000 after smoking 2 packs/day for 20 years.  She reports rare alcohol use.  She received a transfusion during pregnancy in 1978/03/11.  No risk factor for HIV or hepatitis.  She had COVID in January 2021.  She has received the COVID vaccines.  She donates blood approximately 4 times per year, last in December 2021.  ROS:   Positives include: Chronic exertional dyspnea (5-6 years), black stools since starting iron March  2022, occasional heartburn, diarrhea occurring every 2 months and lasting for 2 days  A complete ROS was otherwise negative.  Physical Exam:  Blood pressure 139/81, pulse 63, temperature 98.3 F (36.8 C), temperature source Oral, resp. rate 20, weight 299 lb (135.6 kg), SpO2 94 %.  HEENT: Oral cavity without visible mass, neck without mass Lungs: Clear bilaterally Cardiac: Regular rate and rhythm Abdomen: Nontender, no hepatosplenomegaly, no mass   Vascular: No leg edema Lymph nodes: No cervical, supraclavicular, axillary, or inguinal nodes Neurologic: Alert and oriented, the motor exam appears intact in the upper and lower extremities  bilaterally Skin: No rash, inflamed hair follicle/inclusion cyst in the right groin Musculoskeletal: No spine tenderness   LAB:  CBC  Lab Results  Component Value Date   WBC 11.5 (H) 03/24/2021   HGB 11.0 (L) 03/24/2021   HCT 37.3 03/24/2021   MCV 71.9 (L) 03/24/2021   PLT 304 03/24/2021   NEUTROABS 7.0 03/24/2021  Blood smear: The platelets appear normal in number, no platelet clumps.  The white cell morphology is unremarkable.  There is marked variation in red cell size and shape.  Numerous ovalocytes, rare teardrop.  The polychromasia is not increased.     CMP  Lab Results  Component Value Date   NA 137 02/12/2021   K 3.9 02/12/2021   CL 104 02/12/2021   CO2 24 02/12/2021   GLUCOSE 150 (H) 02/12/2021   BUN 11 02/12/2021   CREATININE 0.50 02/12/2021   CALCIUM 9.2 02/12/2021   PROT 7.0 07/28/2018   ALBUMIN 3.9 07/28/2018   AST 18 07/28/2018   ALT 14 07/28/2018   ALKPHOS 43 07/28/2018   BILITOT 0.8 07/28/2018   GFRNONAA >60 02/12/2021   GFRAA 59 (L) 07/28/2018       Assessment/Plan:   1. Iron deficiency anemia  Ferrex started 02/06/2021, changed to ferrous sulfate 325 mg twice daily 03/24/2021 2. Intermittent diarrhea 3. Hypertension 4. Traumatic right wrist fracture, status postsurgical repair 02/15/2021 5. Hyperlipidemia 6. Status post bilateral knee replacement 7. History of tobacco use 8. Frequent blood donor   Disposition:   Veronica Hansen is referred for hematologic evaluation of microcytic anemia.  The serum iron studies, hematologic indices, and peripheral blood smear are consistent with a diagnosis of iron deficiency anemia.  She underwent a negative GI evaluation to look for source of blood loss in 2016.  She has no apparent source for blood loss and no clinical findings to suggest a malabsorption syndrome.  The iron deficiency is likely related to occult GI blood loss or blood donation.  She has been a frequent blood donor for years.  I suspect this  explains the iron deficiency.  She will return 3 sets of stool Hemoccult cards.  She will begin ferrous sulfate twice daily.  Ms. Hayworth will return for an office and lab visit in 6-8 weeks.  We will check a urinalysis to look for occult GU blood loss at the next office visit.  Betsy Coder, MD  03/24/2021, 3:44 PM

## 2021-03-25 ENCOUNTER — Other Ambulatory Visit: Payer: Self-pay | Admitting: *Deleted

## 2021-03-25 NOTE — Progress Notes (Signed)
MD changed Ferrex to Ferrous sulfate bid

## 2021-04-11 ENCOUNTER — Telehealth: Payer: Self-pay

## 2021-04-11 NOTE — Telephone Encounter (Signed)
Returned call from pt asking about stool cards this nurse unable to speak with patient message left stating stool cards left at front desk ready for pick up  Nothing further call ended

## 2021-04-13 DIAGNOSIS — D509 Iron deficiency anemia, unspecified: Secondary | ICD-10-CM | POA: Diagnosis not present

## 2021-04-17 ENCOUNTER — Other Ambulatory Visit (HOSPITAL_BASED_OUTPATIENT_CLINIC_OR_DEPARTMENT_OTHER): Payer: Self-pay

## 2021-04-17 ENCOUNTER — Inpatient Hospital Stay: Payer: Medicare Other | Attending: Oncology

## 2021-04-17 DIAGNOSIS — I10 Essential (primary) hypertension: Secondary | ICD-10-CM | POA: Insufficient documentation

## 2021-04-17 DIAGNOSIS — R197 Diarrhea, unspecified: Secondary | ICD-10-CM | POA: Diagnosis not present

## 2021-04-17 DIAGNOSIS — D509 Iron deficiency anemia, unspecified: Secondary | ICD-10-CM | POA: Diagnosis not present

## 2021-04-17 DIAGNOSIS — E785 Hyperlipidemia, unspecified: Secondary | ICD-10-CM | POA: Diagnosis not present

## 2021-04-17 DIAGNOSIS — D508 Other iron deficiency anemias: Secondary | ICD-10-CM

## 2021-04-17 LAB — OCCULT BLOOD X 1 CARD TO LAB, STOOL
Fecal Occult Bld: NEGATIVE
Fecal Occult Bld: NEGATIVE
Fecal Occult Bld: NEGATIVE

## 2021-05-09 ENCOUNTER — Inpatient Hospital Stay (HOSPITAL_BASED_OUTPATIENT_CLINIC_OR_DEPARTMENT_OTHER): Payer: Medicare Other | Admitting: Oncology

## 2021-05-09 ENCOUNTER — Other Ambulatory Visit: Payer: Self-pay

## 2021-05-09 ENCOUNTER — Inpatient Hospital Stay: Payer: Medicare Other

## 2021-05-09 VITALS — BP 155/94 | HR 80 | Temp 97.7°F | Resp 20 | Ht 67.0 in | Wt 296.8 lb

## 2021-05-09 DIAGNOSIS — D508 Other iron deficiency anemias: Secondary | ICD-10-CM

## 2021-05-09 DIAGNOSIS — D509 Iron deficiency anemia, unspecified: Secondary | ICD-10-CM | POA: Diagnosis not present

## 2021-05-09 LAB — CBC WITH DIFFERENTIAL (CANCER CENTER ONLY)
Abs Immature Granulocytes: 0.05 10*3/uL (ref 0.00–0.07)
Basophils Absolute: 0.1 10*3/uL (ref 0.0–0.1)
Basophils Relative: 1 %
Eosinophils Absolute: 0.2 10*3/uL (ref 0.0–0.5)
Eosinophils Relative: 2 %
HCT: 37.8 % (ref 36.0–46.0)
Hemoglobin: 11.2 g/dL — ABNORMAL LOW (ref 12.0–15.0)
Immature Granulocytes: 1 %
Lymphocytes Relative: 28 %
Lymphs Abs: 2.6 10*3/uL (ref 0.7–4.0)
MCH: 21.7 pg — ABNORMAL LOW (ref 26.0–34.0)
MCHC: 29.6 g/dL — ABNORMAL LOW (ref 30.0–36.0)
MCV: 73.1 fL — ABNORMAL LOW (ref 80.0–100.0)
Monocytes Absolute: 0.7 10*3/uL (ref 0.1–1.0)
Monocytes Relative: 8 %
Neutro Abs: 5.7 10*3/uL (ref 1.7–7.7)
Neutrophils Relative %: 60 %
Platelet Count: 300 10*3/uL (ref 150–400)
RBC: 5.17 MIL/uL — ABNORMAL HIGH (ref 3.87–5.11)
RDW: 18.3 % — ABNORMAL HIGH (ref 11.5–15.5)
WBC Count: 9.3 10*3/uL (ref 4.0–10.5)
nRBC: 0 % (ref 0.0–0.2)

## 2021-05-09 LAB — URINALYSIS, COMPLETE (UACMP) WITH MICROSCOPIC
Bilirubin Urine: NEGATIVE
Glucose, UA: NEGATIVE mg/dL
Ketones, ur: NEGATIVE mg/dL
Nitrite: NEGATIVE
Protein, ur: 30 mg/dL — AB
Specific Gravity, Urine: >1.03 — ABNORMAL HIGH (ref 1.005–1.030)
pH: 6 (ref 5.0–8.0)

## 2021-05-09 NOTE — Progress Notes (Signed)
  [facility] OFFICE PROGRESS NOTE   Diagnosis: Iron deficiency anemia  INTERVAL HISTORY:   Veronica Hansen returns as scheduled.  No bleeding.  No difficulty with bowel function.  She is taking prescription iron once daily.  She has not started ferrous sulfate.  Objective:  Vital signs in last 24 hours:  Blood pressure (!) 155/94, pulse 80, temperature 97.7 F (36.5 C), temperature source Oral, resp. rate 20, height 5\' 7"  (1.702 m), weight 296 lb 12.8 oz (134.6 kg), SpO2 95 %.     Resp: Lungs clear bilaterally Cardio: Regular rate and rhythm GI: No hepatosplenomegaly Vascular: No leg edema    Portacath/PICC-without erythema  Lab Results:  Lab Results  Component Value Date   WBC 9.3 05/09/2021   HGB 11.2 (L) 05/09/2021   HCT 37.8 05/09/2021   MCV 73.1 (L) 05/09/2021   PLT 300 05/09/2021   NEUTROABS 5.7 05/09/2021    CMP  Lab Results  Component Value Date   NA 137 02/12/2021   K 3.9 02/12/2021   CL 104 02/12/2021   CO2 24 02/12/2021   GLUCOSE 150 (H) 02/12/2021   BUN 11 02/12/2021   CREATININE 0.50 02/12/2021   CALCIUM 9.2 02/12/2021   PROT 7.0 07/28/2018   ALBUMIN 3.9 07/28/2018   AST 18 07/28/2018   ALT 14 07/28/2018   ALKPHOS 43 07/28/2018   BILITOT 0.8 07/28/2018   GFRNONAA >60 02/12/2021   GFRAA 59 (L) 07/28/2018     Medications: I have reviewed the patient's current medications.   Assessment/Plan: Iron deficiency anemia Ferrex started 02/06/2021, changed to ferrous sulfate 325 mg 3 times daily 05/09/2021 Intermittent diarrhea Hypertension Traumatic right wrist fracture, status postsurgical repair 02/15/2021 Hyperlipidemia Status post bilateral knee replacement History of tobacco use Frequent blood donor    Disposition: Veronica Hansen appears stable.  The hemoglobin is stable.  Stool Hemoccult cards returned -3 weeks ago and there is no blood in the urine.  The iron deficiency anemia may be related to occult blood loss versus blood  donation. I encouraged her to begin ferrous sulfate 3 times daily.  She will call if she develops nausea or constipation while on ferrous sulfate.  She will return for an office and lab visit in approximately 6 weeks.  Betsy Coder, MD  05/09/2021  12:22 PM

## 2021-06-20 ENCOUNTER — Ambulatory Visit: Payer: Medicare Other | Admitting: Nurse Practitioner

## 2021-06-20 ENCOUNTER — Other Ambulatory Visit: Payer: Medicare Other

## 2021-07-24 ENCOUNTER — Encounter: Payer: Self-pay | Admitting: Nurse Practitioner

## 2021-07-24 ENCOUNTER — Inpatient Hospital Stay: Payer: Medicare Other | Admitting: Nurse Practitioner

## 2021-07-24 ENCOUNTER — Inpatient Hospital Stay: Payer: Medicare Other | Attending: Oncology

## 2021-07-24 ENCOUNTER — Other Ambulatory Visit: Payer: Self-pay

## 2021-07-24 VITALS — BP 163/82 | HR 76 | Temp 97.8°F | Resp 18 | Ht 67.0 in | Wt 300.4 lb

## 2021-07-24 DIAGNOSIS — Z79899 Other long term (current) drug therapy: Secondary | ICD-10-CM | POA: Diagnosis not present

## 2021-07-24 DIAGNOSIS — I10 Essential (primary) hypertension: Secondary | ICD-10-CM | POA: Insufficient documentation

## 2021-07-24 DIAGNOSIS — D508 Other iron deficiency anemias: Secondary | ICD-10-CM

## 2021-07-24 DIAGNOSIS — D509 Iron deficiency anemia, unspecified: Secondary | ICD-10-CM | POA: Insufficient documentation

## 2021-07-24 DIAGNOSIS — E785 Hyperlipidemia, unspecified: Secondary | ICD-10-CM | POA: Diagnosis not present

## 2021-07-24 LAB — CBC WITH DIFFERENTIAL (CANCER CENTER ONLY)
Abs Immature Granulocytes: 0.03 10*3/uL (ref 0.00–0.07)
Basophils Absolute: 0.1 10*3/uL (ref 0.0–0.1)
Basophils Relative: 1 %
Eosinophils Absolute: 0.2 10*3/uL (ref 0.0–0.5)
Eosinophils Relative: 2 %
HCT: 39.8 % (ref 36.0–46.0)
Hemoglobin: 12.1 g/dL (ref 12.0–15.0)
Immature Granulocytes: 0 %
Lymphocytes Relative: 28 %
Lymphs Abs: 2.7 10*3/uL (ref 0.7–4.0)
MCH: 22.7 pg — ABNORMAL LOW (ref 26.0–34.0)
MCHC: 30.4 g/dL (ref 30.0–36.0)
MCV: 74.8 fL — ABNORMAL LOW (ref 80.0–100.0)
Monocytes Absolute: 0.8 10*3/uL (ref 0.1–1.0)
Monocytes Relative: 9 %
Neutro Abs: 5.8 10*3/uL (ref 1.7–7.7)
Neutrophils Relative %: 60 %
Platelet Count: 260 10*3/uL (ref 150–400)
RBC: 5.32 MIL/uL — ABNORMAL HIGH (ref 3.87–5.11)
RDW: 18.2 % — ABNORMAL HIGH (ref 11.5–15.5)
WBC Count: 9.6 10*3/uL (ref 4.0–10.5)
nRBC: 0 % (ref 0.0–0.2)

## 2021-07-24 LAB — FERRITIN: Ferritin: 12 ng/mL (ref 11–307)

## 2021-07-24 LAB — IRON AND TIBC
Iron: 92 ug/dL (ref 28–170)
Saturation Ratios: 16 % (ref 10.4–31.8)
TIBC: 588 ug/dL — ABNORMAL HIGH (ref 250–450)
UIBC: 496 ug/dL

## 2021-07-24 NOTE — Progress Notes (Signed)
  [facility] OFFICE PROGRESS NOTE   Diagnosis: Iron deficiency anemia  INTERVAL HISTORY:   Veronica Hansen returns as scheduled.  She is taking 2 iron tablets a day.  Mild nausea but otherwise tolerating well.  No constipation.  She denies bleeding.  Objective:  Vital signs in last 24 hours:  Blood pressure (!) 163/82, pulse 76, temperature 97.8 F (36.6 C), temperature source Oral, resp. rate 18, height '5\' 7"'$  (1.702 m), weight (!) 300 lb 6.4 oz (136.3 kg), SpO2 91 %.     Resp: Lungs clear bilaterally. Cardio: Regular rate and rhythm. GI: Abdomen soft and nontender.  No hepatosplenomegaly. Vascular: Trace lower leg edema bilaterally.   Lab Results:  Lab Results  Component Value Date   WBC 9.6 07/24/2021   HGB 12.1 07/24/2021   HCT 39.8 07/24/2021   MCV 74.8 (L) 07/24/2021   PLT 260 07/24/2021   NEUTROABS 5.8 07/24/2021    Imaging:  No results found.  Medications: I have reviewed the patient's current medications.  Assessment/Plan: Iron deficiency anemia Ferrex started 02/06/2021, changed to ferrous sulfate 325 mg 3 times daily 05/09/2021; patient reports that she is taking ferrous sulfate twice daily 07/24/2021 Intermittent diarrhea Hypertension Traumatic right wrist fracture, status postsurgical repair 02/15/2021 Hyperlipidemia Status post bilateral knee replacement History of tobacco use Frequent blood donor  Disposition: Veronica Hansen appears stable.  Hemoglobin is better, now in normal range.  Red cells continue to be microcytic.  We will follow-up on the ferritin from today.  She will continue ferrous sulfate twice daily.  She will return for lab and follow-up in 6 to 8 weeks.    Ned Card ANP/GNP-BC   07/24/2021  1:32 PM

## 2021-07-25 ENCOUNTER — Telehealth: Payer: Self-pay | Admitting: Oncology

## 2021-07-25 NOTE — Telephone Encounter (Signed)
Scheduled appt per 9/8 los - pt is aware of appt date and time   

## 2021-08-04 ENCOUNTER — Ambulatory Visit: Payer: Medicare Other | Admitting: Physical Therapy

## 2021-10-02 ENCOUNTER — Inpatient Hospital Stay: Payer: Medicare Other

## 2021-10-02 ENCOUNTER — Inpatient Hospital Stay: Payer: Medicare Other | Admitting: Oncology

## 2022-02-26 LAB — IFOBT (OCCULT BLOOD): IFOBT: POSITIVE

## 2022-03-26 ENCOUNTER — Encounter: Payer: Self-pay | Admitting: Gastroenterology

## 2022-03-30 ENCOUNTER — Ambulatory Visit (INDEPENDENT_AMBULATORY_CARE_PROVIDER_SITE_OTHER): Payer: Medicare Other | Admitting: Gastroenterology

## 2022-03-30 ENCOUNTER — Encounter: Payer: Self-pay | Admitting: Gastroenterology

## 2022-03-30 VITALS — BP 138/82 | HR 65 | Ht 67.0 in | Wt 284.0 lb

## 2022-03-30 DIAGNOSIS — R159 Full incontinence of feces: Secondary | ICD-10-CM

## 2022-03-30 DIAGNOSIS — R195 Other fecal abnormalities: Secondary | ICD-10-CM

## 2022-03-30 NOTE — Patient Instructions (Addendum)
If you are age 74 or older, your body mass index should be between 23-30. Your Body mass index is 44.48 kg/m?Marland Kitchen If this is out of the aforementioned range listed, please consider follow up with your Primary Care Provider. ? ?If you are age 63 or younger, your body mass index should be between 19-25. Your Body mass index is 44.48 kg/m?Marland Kitchen If this is out of the aformentioned range listed, please consider follow up with your Primary Care Provider.  ? ?________________________________________________________ ? ?The [facility] providers would like to encourage you to use [facility] to communicate with providers for non-urgent requests or questions.  Due to long hold times on the telephone, sending your provider a message by [facility] may be a faster and more efficient way to get a response.  Please allow 48 business hours for a response.  Please remember that this is for non-urgent requests.  ?_______________________________________________________ ? ?You have been scheduled for a colonoscopy. Please follow written instructions given to you at your visit today.  ?Please pick up your prep supplies at the pharmacy within the next 1-3 days. ?If you use inhalers (even only as needed), please bring them with you on the day of your procedure. ? ?Stop iron tablet 7 days prior to procedure ? ?Kegel Exercises ? ?Kegel exercises can help strengthen your pelvic floor muscles. The pelvic floor is a group of muscles that support your rectum, small intestine, and bladder. In females, pelvic floor muscles also help support the uterus. These muscles help you control the flow of urine and stool (feces). ?Kegel exercises are painless and simple. They do not require any equipment. Your provider may suggest Kegel exercises to: ?Improve bladder and bowel control. ?Improve sexual response. ?Improve weak pelvic floor muscles after surgery to remove the uterus (hysterectomy) or after pregnancy, in females. ?Improve weak pelvic floor muscles after  prostate gland removal or surgery, in males. ?Kegel exercises involve squeezing your pelvic floor muscles. These are the same muscles you squeeze when you try to stop the flow of urine or keep from passing gas. The exercises can be done while sitting, standing, or lying down, but it is best to vary your position. ?Ask your health care provider which exercises are safe for you. Do exercises exactly as told by your health care provider and adjust them as directed. Do not begin these exercises until told by your health care provider. ?Exercises ?How to do Kegel exercises: ?Squeeze your pelvic floor muscles tight. You should feel a tight lift in your rectal area. If you are a female, you should also feel a tightness in your vaginal area. Keep your stomach, buttocks, and legs relaxed. ?Hold the muscles tight for up to 10 seconds. ?Breathe normally. ?Relax your muscles for up to 10 seconds. ?Repeat as told by your health care provider. ?Repeat this exercise daily as told by your health care provider. Continue to do this exercise for at least 4-6 weeks, or for as long as told by your health care provider. ?You may be referred to a physical therapist who can help you learn more about how to do Kegel exercises. ?Depending on your condition, your health care provider may recommend: ?Varying how long you squeeze your muscles. ?Doing several sets of exercises every day. ?Doing exercises for several weeks. ?Making Kegel exercises a part of your regular exercise routine. ?This information is not intended to replace advice given to you by your health care provider. Make sure you discuss any questions you have with your health  care provider. ?Document Revised: 03/13/2021 Document Reviewed: 03/13/2021 ?Elsevier Patient Education ? [facility]. ? ?Thank you, ? ?Dr. Jackquline Denmark ? ? ? ?

## 2022-03-30 NOTE — Progress Notes (Signed)
? ? ?Chief Complaint: for GI eval  ? ?Referring Provider:  Donnajean Lopes, MD    ? ? ?ASSESSMENT AND PLAN;  ? ?#1. H+ stools with H/O IDA ? ?#2. Fecal incontinence ? ?Plan: ?-Colon with plenview (samples given) ?-Kegel's exercises. ?-Blood work results from Dr Sharlett Iles. ?-If still with fecal incontinence, consider anorectal manometry. ? ? ?Discussed risks & benefits of colonoscopy. Risks including rare perforation req laparotomy, bleeding after bx/polypectomy req blood transfusion, rarely missing neoplasms, risks of anesthesia/sedation, rare risk of damage to internal organs. Benefits outweigh the risks. Patient agrees to proceed. All the questions were answered. Pt consents to proceed. ?HPI:   ? ?Veronica Hansen is a 76 y.o. female  ?With A Fib s/p ablation (off AC), COPD (not on home O2), diet-controlled DM 2, HTN, HLD, OSA on CPAP, history of colonic polyps 02-07-12, IDA (followed by hematology, on iron supplements) ? ?Found to have H+ stools.  Apparently had Nl labs at Dr. Buel Ream office (actual report awaited) ? ?Sent to GI clinic for further eval. ? ?Occ diarrhea ?Bms 4/week ?Lately has been having problems with incontinence ?Denies having any urinary incontinence. ? ?No abdominal pain.  No upper GI symptoms.  Denies any recent weight loss. ? ?SH-lost her husband to [place], had been married for 47 years. ?Daughter - Sherilyn Banker is planning to visit for 2 weeks, then has trip planned to ?[place]. ? ? ?Past GI work-up ?Colon Nov 2016 Dr Amedeo Plenty- neg ?Feb 07, 2012- polyps ? ?Past Medical History:  ?Diagnosis Date  ? A-fib ([place])   ? Anemia   ? Arthritis   ? joints hurt  ? COPD (chronic obstructive pulmonary disease) ([place])   ? Depression   ? husband died of Covid 2020/02/07  ? Diabetes mellitus   ? type 2, no longer takes medication   ? Dyspnea   ? GERD (gastroesophageal reflux disease)   ? History of blood transfusion   ? Hyperlipidemia   ? Hypertension   ? Incontinence   ? OSA (obstructive sleep apnea)   ? OSA on CPAP  03/07/2014  ? Runny nose   ? seasonal - poss allergies  ? Sinus problem   ? SOB (shortness of breath)   ? ? ?Past Surgical History:  ?Procedure Laterality Date  ? A-FLUTTER ABLATION N/A 08/19/2018  ? Procedure: A-FLUTTER ABLATION;  Surgeon: Evans Lance, MD;  Location: [facility];  Service: Cardiovascular;  Laterality: N/A;  ? BARIATRIC SURGERY  2012/02/07  ? dental implants  11/16/2009  ? ORIF ULNAR FRACTURE Right 02/15/2021  ? Procedure: OPEN REDUCTION INTERNAL FIXATION (ORIF) ULNAR FRACTURE and distal radius open reduction and internal fixation and repair;  Surgeon: Iran Planas, MD;  Location: [facility];  Service: Orthopedics;  Laterality: Right;  with IV sedation  ? TOTAL KNEE ARTHROPLASTY  11/17/2003  ? left  ? TOTAL KNEE ARTHROPLASTY  11/16/2005  ? right  ? ? ?Family History  ?Problem Relation Age of Onset  ? Diabetes Father   ? Diabetes Brother   ? Colon cancer Neg Hx   ? Rectal cancer Neg Hx   ? Stomach cancer Neg Hx   ? ? ?Social History  ? ?Tobacco Use  ? Smoking status: Former  ?  Years: 20.00  ?  Types: Cigarettes  ?  Quit date: 08/19/2000  ?  Years since quitting: 21.6  ? Smokeless tobacco: Never  ?Vaping Use  ? Vaping Use: Never used  ?Substance Use Topics  ? Alcohol use: No  ?  Comment: Rare  ? Drug use: No  ? ? ?Current Outpatient Medications  ?Medication Sig Dispense Refill  ? escitalopram (LEXAPRO) 10 MG tablet Take 10 mg by mouth daily.    ? ezetimibe (ZETIA) 10 MG tablet Take 10 mg by mouth daily.    ? ferrous sulfate 325 (65 FE) MG tablet Take 325 mg by mouth 2 (two) times daily.    ? metoprolol succinate (TOPROL-XL) 100 MG 24 hr tablet Take 100 mg by mouth daily.    ? Multiple Vitamins-Minerals (CENTRUM ADULTS PO) Take by mouth.    ? Multiple Vitamins-Minerals (PRESERVISION AREDS 2+MULTI VIT PO) Take 1 tablet by mouth 2 (two) times daily.     ? olmesartan (BENICAR) 40 MG tablet Take 40 mg by mouth daily.    ? omeprazole (PRILOSEC) 20 MG capsule Take 1 capsule by mouth daily.    ? simvastatin  (ZOCOR) 40 MG tablet Take 40 mg by mouth daily.    ? simvastatin (ZOCOR) 40 MG tablet 1 tablet in the evening    ? Dulaglutide (TRULICITY) 1.5 OQ/9.4TM SOPN Trulicity 1.5 LY/6.5 mL subcutaneous pen injector    ? ?No current facility-administered medications for this visit.  ? ? ?Allergies  ?Allergen Reactions  ? Orlistat   ?  Other reaction(s): severe diarrhea  ? Phentermine Hcl   ?  Other reaction(s): elevated BP  ? ? ?Review of Systems:  ?Constitutional: Denies fever, chills, diaphoresis, appetite change and fatigue.  ?HEENT: Denies photophobia, eye pain, redness, hearing loss, ear pain, congestion, sore throat, rhinorrhea, sneezing, mouth sores, neck pain, neck stiffness and tinnitus.   ?Respiratory: Denies SOB, DOE, cough, chest tightness,  and wheezing.   ?Cardiovascular: Denies chest pain, palpitations and leg swelling.  ?Genitourinary: Denies dysuria, urgency, frequency, hematuria, flank pain and difficulty urinating.  ?Musculoskeletal: has myalgias, back pain, joint swelling, arthralgias and gait problem.  ?Skin: No rash.  ?Neurological: Denies dizziness, seizures, syncope, weakness, light-headedness, numbness and headaches.  ?Hematological: Denies adenopathy. Easy bruising, personal or family bleeding history  ?Psychiatric/Behavioral: Has situational anxiety or depression ? ?  ? ?Physical Exam:   ? ?BP 138/82   Pulse 65   Ht '5\' 7"'$  (1.702 m)   Wt 284 lb (128.8 kg)   SpO2 95%   BMI 44.48 kg/m?  ?Wt Readings from Last 3 Encounters:  ?03/30/22 284 lb (128.8 kg)  ?07/24/21 (!) 300 lb 6.4 oz (136.3 kg)  ?05/09/21 296 lb 12.8 oz (134.6 kg)  ? ?Constitutional:  Well-developed, in no acute distress. ?Psychiatric: Normal mood and affect. Behavior is normal. ?HEENT: Pupils normal.  Conjunctivae are normal. No scleral icterus. ?Cardiovascular: Normal rate, regular rhythm. No edema ?Pulmonary/chest: Effort normal and breath sounds normal. No wheezing, rales or rhonchi. ?Abdominal: Soft, nondistended. Nontender.  Bowel sounds active throughout. There are no masses palpable. No hepatomegaly. ?Rectal: To be performed at the time of colonoscopy ?Neurological: Alert and oriented to person place and time. ?Skin: Skin is warm and dry. No rashes noted. ? ?Data Reviewed: I have personally reviewed following labs and imaging studies ? ?CBC: ? ?  Latest Ref Rng & Units 07/24/2021  ?  1:01 PM 05/09/2021  ? 11:17 AM 03/24/2021  ?  2:36 PM  ?CBC  ?WBC 4.0 - 10.5 K/uL 9.6   9.3   11.5    ?Hemoglobin 12.0 - 15.0 g/dL 12.1   11.2   11.0    ?Hematocrit 36.0 - 46.0 % 39.8   37.8   37.3    ?Platelets 150 -  400 K/uL 260   300   304    ? ? ?CMP: ? ?  Latest Ref Rng & Units 02/12/2021  ?  9:52 PM 07/28/2018  ?  9:27 AM  ?CMP  ?Glucose 70 - 99 mg/dL 150   127    ?BUN 8 - 23 mg/dL 11   18    ?Creatinine 0.44 - 1.00 mg/dL 0.50   1.08    ?Sodium 135 - 145 mmol/L 137   140    ?Potassium 3.5 - 5.1 mmol/L 3.9   4.5    ?Chloride 98 - 111 mmol/L 104   107    ?CO2 22 - 32 mmol/L 24   25    ?Calcium 8.9 - 10.3 mg/dL 9.2   9.3    ?Total Protein 6.5 - 8.1 g/dL  7.0    ?Total Bilirubin 0.3 - 1.2 mg/dL  0.8    ?Alkaline Phos 38 - 126 U/L  43    ?AST 15 - 41 U/L  18    ?ALT 0 - 44 U/L  14    ? ? ? ? ? ? ?Carmell Austria, MD 03/30/2022, 3:38 PM ? ?Cc: Donnajean Lopes, MD ? ? ?

## 2022-05-20 ENCOUNTER — Encounter: Payer: Self-pay | Admitting: Gastroenterology

## 2022-05-27 ENCOUNTER — Ambulatory Visit (AMBULATORY_SURGERY_CENTER): Payer: Medicare Other | Admitting: Gastroenterology

## 2022-05-27 ENCOUNTER — Encounter: Payer: Self-pay | Admitting: Gastroenterology

## 2022-05-27 VITALS — BP 145/84 | HR 69 | Temp 97.0°F | Resp 12 | Ht 67.0 in | Wt 284.0 lb

## 2022-05-27 DIAGNOSIS — K5730 Diverticulosis of large intestine without perforation or abscess without bleeding: Secondary | ICD-10-CM

## 2022-05-27 DIAGNOSIS — K640 First degree hemorrhoids: Secondary | ICD-10-CM

## 2022-05-27 DIAGNOSIS — K5000 Crohn's disease of small intestine without complications: Secondary | ICD-10-CM | POA: Diagnosis not present

## 2022-05-27 DIAGNOSIS — D122 Benign neoplasm of ascending colon: Secondary | ICD-10-CM

## 2022-05-27 DIAGNOSIS — R197 Diarrhea, unspecified: Secondary | ICD-10-CM

## 2022-05-27 DIAGNOSIS — K5289 Other specified noninfective gastroenteritis and colitis: Secondary | ICD-10-CM | POA: Diagnosis not present

## 2022-05-27 DIAGNOSIS — R159 Full incontinence of feces: Secondary | ICD-10-CM

## 2022-05-27 DIAGNOSIS — K635 Polyp of colon: Secondary | ICD-10-CM

## 2022-05-27 DIAGNOSIS — R195 Other fecal abnormalities: Secondary | ICD-10-CM

## 2022-05-27 MED ORDER — SODIUM CHLORIDE 0.9 % IV SOLN: 500.0000 mL | INTRAVENOUS | Status: DC

## 2022-05-27 NOTE — Progress Notes (Signed)
To pacu, VSS. Report to [email or web address] 

## 2022-05-27 NOTE — Op Note (Signed)
[facility] Patient Name: Veronica Hansen Procedure Date: 05/27/2022 8:35 AM MRN: [id number] Endoscopist: Jackquline Denmark , MD Age: 76 Referring MD:  Date of Birth: 1946-10-31 Gender: Female Account #: [id number] Procedure:                Colonoscopy Indications:              H + stools with occ diarrhea and incontinence. Medicines:                Monitored Anesthesia Care Procedure:                Pre-Anesthesia Assessment:                           - Prior to the procedure, a History and Physical                            was performed, and patient medications and                            allergies were reviewed. The patient's tolerance of                            previous anesthesia was also reviewed. The risks                            and benefits of the procedure and the sedation                            options and risks were discussed with the patient.                            All questions were answered, and informed consent                            was obtained. Prior Anticoagulants: The patient has                            taken no previous anticoagulant or antiplatelet                            agents. ASA Grade Assessment: III - A patient with                            severe systemic disease. After reviewing the risks                            and benefits, the patient was deemed in                            satisfactory condition to undergo the procedure.                           After obtaining informed consent, the colonoscope  was passed under direct vision. Throughout the                            procedure, the patient's blood pressure, pulse, and                            oxygen saturations were monitored continuously. The                            Olympus PCF-H190DL (#[id number]) Colonoscope was                            introduced through the anus and advanced to the 4                            cm into the  ileum. The colonoscopy was performed                            without difficulty. The patient tolerated the                            procedure well. The quality of the bowel                            preparation was adequate to identify polyps. The                            terminal ileum, ileocecal valve, appendiceal                            orifice, and rectum were photographed. Scope In: 8:39:17 AM Scope Out: 8:58:59 AM Scope Withdrawal Time: 0 hours 13 minutes 30 seconds  Total Procedure Duration: 0 hours 19 minutes 42 seconds  Findings:                 A 6 mm polyp was found in the proximal ascending                            colon. The polyp was sessile. The polyp was removed                            with a cold snare. Resection and retrieval were                            complete.                           The colon (entire examined portion) appeared                            normal. Biopsies for histology were taken with a                            cold forceps from the entire colon for evaluation  of microscopic colitis.                           A few small-mouthed diverticula were found in the                            sigmoid colon.                           Non-bleeding internal hemorrhoids were found during                            retroflexion. The hemorrhoids were small and Grade                            I (internal hemorrhoids that do not prolapse).                           The terminal ileum appeared normal except for 2-3                            small superficial erosions. Biopsies were taken                            with a cold forceps for histology.                           The exam was otherwise without abnormality on                            direct and retroflexion views. Complications:            No immediate complications. Estimated Blood Loss:     Estimated blood loss: none. Impression:               - One 6  mm polyp in the proximal ascending colon,                            removed with a cold snare. Resected and retrieved.                           - Minimal sigmoid diverticulosis.                           - Non-bleeding internal hemorrhoids.                           - The examined portion of the ileum was normal                            except for 2-3 small superficial erosions. Biopsied.                           - The examination was otherwise normal on direct  and retroflexion views. Recommendation:           - Patient has a contact number available for                            emergencies. The signs and symptoms of potential                            delayed complications were discussed with the                            patient. Return to normal activities tomorrow.                            Written discharge instructions were provided to the                            patient.                           - Resume previous diet.                           - Continue present medications.                           - Await pathology results.                           - Repeat colonoscopy is not recommended for                            screening purposes.                           - Kegel's exercises.                           - The findings and recommendations were discussed                            with the patient's family.                           - FU in GI clinic if still with problems. Jackquline Denmark, MD 05/27/2022 9:04:21 AM This report has been signed electronically.

## 2022-05-27 NOTE — Patient Instructions (Signed)
YOU HAD AN ENDOSCOPIC PROCEDURE TODAY AT THE [facility]:   Refer to the procedure report that was given to you for any specific questions about what was found during the examination.  If the procedure report does not answer your questions, please call your gastroenterologist to clarify.  If you requested that your care partner not be given the details of your procedure findings, then the procedure report has been included in a sealed envelope for you to review at your convenience later.  YOU SHOULD EXPECT: Some feelings of bloating in the abdomen. Passage of more gas than usual.  Walking can help get rid of the air that was put into your GI tract during the procedure and reduce the bloating. If you had a lower endoscopy (such as a colonoscopy or flexible sigmoidoscopy) you may notice spotting of blood in your stool or on the toilet paper. If you underwent a bowel prep for your procedure, you may not have a normal bowel movement for a few days.  Please Note:  You might notice some irritation and congestion in your nose or some drainage.  This is from the oxygen used during your procedure.  There is no need for concern and it should clear up in a day or so.  SYMPTOMS TO REPORT IMMEDIATELY:  Following lower endoscopy (colonoscopy or flexible sigmoidoscopy):  Excessive amounts of blood in the stool  Significant tenderness or worsening of abdominal pains  Swelling of the abdomen that is new, acute  Fever of 100F or higher  Following upper endoscopy (EGD)  Vomiting of blood or coffee ground material  New chest pain or pain under the shoulder blades  Painful or persistently difficult swallowing  New shortness of breath  Fever of 100F or higher  Black, tarry-looking stools  For urgent or emergent issues, a gastroenterologist can be reached at any hour by calling [phone]. Do not use MyChart messaging for urgent concerns.    DIET:  We do recommend a small meal at first, but  then you may proceed to your regular diet.  Drink plenty of fluids but you should avoid alcoholic beverages for 24 hours.  ACTIVITY:  You should plan to take it easy for the rest of today and you should NOT DRIVE or use heavy machinery until tomorrow (because of the sedation medicines used during the test).    FOLLOW UP: Our staff will call the number listed on your records the next business day following your procedure.  We will call around 7:15- 8:00 am to check on you and address any questions or concerns that you may have regarding the information given to you following your procedure. If we do not reach you, we will leave a message.  If you develop any symptoms (ie: fever, flu-like symptoms, shortness of breath, cough etc.) before then, please call [phone].  If you test positive for Covid 19 in the 2 weeks post procedure, please call and report this information to us.    If any biopsies were taken you will be contacted by phone or by letter within the next 1-3 weeks.  Please call us at [phone] if you have not heard about the biopsies in 3 weeks.    SIGNATURES/CONFIDENTIALITY: You and/or your care partner have signed paperwork which will be entered into your electronic medical record.  These signatures attest to the fact that that the information above on your After Visit Summary has been reviewed and is understood.  Full responsibility of the confidentiality   of this discharge information lies with you and/or your care-partner.  

## 2022-05-27 NOTE — Progress Notes (Signed)
Called to room to assist during endoscopic procedure.  Patient ID and intended procedure confirmed with present staff. Received instructions for my participation in the procedure from the performing physician.  

## 2022-05-27 NOTE — Progress Notes (Signed)
Chief Complaint: for GI eval   Referring Provider:  Donnajean Lopes, MD      ASSESSMENT AND PLAN;   #1. H+ stools with H/O IDA  #2. Fecal incontinence  Plan: -Colon with plenview (samples given) -Kegel's exercises. -Blood work results from Dr Sharlett Iles. -If still with fecal incontinence, consider anorectal manometry.   Discussed risks & benefits of colonoscopy. Risks including rare perforation req laparotomy, bleeding after bx/polypectomy req blood transfusion, rarely missing neoplasms, risks of anesthesia/sedation, rare risk of damage to internal organs. Benefits outweigh the risks. Patient agrees to proceed. All the questions were answered. Pt consents to proceed.  Hb 12.7, MCV 75 HPI:    Veronica Hansen is a 76 y.o. female  With A Fib s/p ablation (off AC), COPD (not on home O2), diet-controlled DM 2, HTN, HLD, OSA on CPAP, history of colonic polyps 02/09/12, IDA (followed by hematology, on iron supplements)  Found to have H+ stools.  Apparently had Nl labs at Dr. Buel Ream office (actual report awaited)  Sent to GI clinic for further eval.  Occ diarrhea Bms 4/week Lately has been having problems with incontinence Denies having any urinary incontinence.  No abdominal pain.  No upper GI symptoms.  Denies any recent weight loss.  SH-lost her husband to [place], had been married for 82 years. Daughter - Sherilyn Banker is planning to visit for 2 weeks, then has trip planned to [place].   Past GI work-up Colon Nov 2016 Dr Amedeo Plenty- neg Feb 09, 2012- polyps  Past Medical History:  Diagnosis Date   A-fib [facility])    Anemia    Arthritis    joints hurt   COPD (chronic obstructive pulmonary disease) ([place])    Depression    husband died of Covid February 09, 2020   Diabetes mellitus    type 2, no longer takes medication    Dyspnea    GERD (gastroesophageal reflux disease)    History of blood transfusion    Hyperlipidemia    Hypertension    Incontinence    OSA (obstructive sleep apnea)     OSA on CPAP 03/07/2014   Runny nose    seasonal - poss allergies   Sinus problem    SOB (shortness of breath)     Past Surgical History:  Procedure Laterality Date   A-FLUTTER ABLATION N/A 08/19/2018   Procedure: A-FLUTTER ABLATION;  Surgeon: Evans Lance, MD;  Location: [place] [facility];  Service: Cardiovascular;  Laterality: N/A;   BARIATRIC SURGERY  Feb 09, 2012   dental implants  11/16/2009   ORIF ULNAR FRACTURE Right 02/15/2021   Procedure: OPEN REDUCTION INTERNAL FIXATION (ORIF) ULNAR FRACTURE and distal radius open reduction and internal fixation and repair;  Surgeon: Iran Planas, MD;  Location: [place];  Service: Orthopedics;  Laterality: Right;  with IV sedation   TOTAL KNEE ARTHROPLASTY  11/17/2003   left   TOTAL KNEE ARTHROPLASTY  11/16/2005   right    Family History  Problem Relation Age of Onset   Diabetes Father    Diabetes Brother    Colon cancer Neg Hx    Rectal cancer Neg Hx    Stomach cancer Neg Hx     Social History   Tobacco Use   Smoking status: Former    Years: 20.00    Types: Cigarettes    Quit date: 08/19/2000    Years since quitting: 21.7   Smokeless tobacco: Never  Vaping Use   Vaping Use: Never used  Substance Use Topics   Alcohol  use: No    Comment: Rare   Drug use: No    Current Outpatient Medications  Medication Sig Dispense Refill   Dulaglutide (TRULICITY) 1.5 RC/1.6LA SOPN Trulicity 1.5 GT/3.6 mL subcutaneous pen injector     escitalopram (LEXAPRO) 10 MG tablet Take 10 mg by mouth daily.     metoprolol succinate (TOPROL-XL) 100 MG 24 hr tablet Take 100 mg by mouth daily.     Multiple Vitamins-Minerals (CENTRUM ADULTS PO) Take by mouth.     Multiple Vitamins-Minerals (PRESERVISION AREDS 2+MULTI VIT PO) Take 1 tablet by mouth 2 (two) times daily.      olmesartan (BENICAR) 40 MG tablet Take 40 mg by mouth daily.     simvastatin (ZOCOR) 40 MG tablet Take 40 mg by mouth daily.     ezetimibe (ZETIA) 10 MG tablet Take 10 mg by mouth  daily. (Patient not taking: Reported on 05/27/2022)     ferrous sulfate 325 (65 FE) MG tablet Take 325 mg by mouth 2 (two) times daily.     omeprazole (PRILOSEC) 20 MG capsule Take 1 capsule by mouth daily.     simvastatin (ZOCOR) 40 MG tablet 1 tablet in the evening     Current Facility-Administered Medications  Medication Dose Route Frequency Provider Last Rate Last Admin   0.9 %  sodium chloride infusion  500 mL Intravenous Continuous Jackquline Denmark, MD        Allergies  Allergen Reactions   Orlistat     Other reaction(s): severe diarrhea   Phentermine Hcl     Other reaction(s): elevated BP    Review of Systems:  Constitutional: Denies fever, chills, diaphoresis, appetite change and fatigue.  HEENT: Denies photophobia, eye pain, redness, hearing loss, ear pain, congestion, sore throat, rhinorrhea, sneezing, mouth sores, neck pain, neck stiffness and tinnitus.   Respiratory: Denies SOB, DOE, cough, chest tightness,  and wheezing.   Cardiovascular: Denies chest pain, palpitations and leg swelling.  Genitourinary: Denies dysuria, urgency, frequency, hematuria, flank pain and difficulty urinating.  Musculoskeletal: has myalgias, back pain, joint swelling, arthralgias and gait problem.  Skin: No rash.  Neurological: Denies dizziness, seizures, syncope, weakness, light-headedness, numbness and headaches.  Hematological: Denies adenopathy. Easy bruising, personal or family bleeding history  Psychiatric/Behavioral: Has situational anxiety or depression     Physical Exam:    BP (!) 146/54   Pulse 81   Temp (!) 97 F (36.1 C)   Ht '5\' 7"'$  (1.702 m)   Wt 284 lb (128.8 kg)   SpO2 92%   BMI 44.48 kg/m  Wt Readings from Last 3 Encounters:  05/27/22 284 lb (128.8 kg)  03/30/22 284 lb (128.8 kg)  07/24/21 (!) 300 lb 6.4 oz (136.3 kg)   Constitutional:  Well-developed, in no acute distress. Psychiatric: Normal mood and affect. Behavior is normal. HEENT: Pupils normal.  Conjunctivae  are normal. No scleral icterus. Cardiovascular: Normal rate, regular rhythm. No edema Pulmonary/chest: Effort normal and breath sounds normal. No wheezing, rales or rhonchi. Abdominal: Soft, nondistended. Nontender. Bowel sounds active throughout. There are no masses palpable. No hepatomegaly. Rectal: To be performed at the time of colonoscopy Neurological: Alert and oriented to person place and time. Skin: Skin is warm and dry. No rashes noted.  Data Reviewed: I have personally reviewed following labs and imaging studies  CBC:    Latest Ref Rng & Units 07/24/2021    1:01 PM 05/09/2021   11:17 AM 03/24/2021    2:36 PM  CBC  WBC 4.0 - 10.5  K/uL 9.6  9.3  11.5   Hemoglobin 12.0 - 15.0 g/dL 12.1  11.2  11.0   Hematocrit 36.0 - 46.0 % 39.8  37.8  37.3   Platelets 150 - 400 K/uL 260  300  304     CMP:    Latest Ref Rng & Units 02/12/2021    9:52 PM 07/28/2018    9:27 AM  CMP  Glucose 70 - 99 mg/dL 150  127   BUN 8 - 23 mg/dL 11  18   Creatinine 0.44 - 1.00 mg/dL 0.50  1.08   Sodium 135 - 145 mmol/L 137  140   Potassium 3.5 - 5.1 mmol/L 3.9  4.5   Chloride 98 - 111 mmol/L 104  107   CO2 22 - 32 mmol/L 24  25   Calcium 8.9 - 10.3 mg/dL 9.2  9.3   Total Protein 6.5 - 8.1 g/dL  7.0   Total Bilirubin 0.3 - 1.2 mg/dL  0.8   Alkaline Phos 38 - 126 U/L  43   AST 15 - 41 U/L  18   ALT 0 - 44 U/L  14         Carmell Austria, MD 05/27/2022, 8:33 AM  Cc: Donnajean Lopes, MD

## 2022-05-27 NOTE — Progress Notes (Signed)
Pt's states no medical or surgical changes since previsit or office visit. 

## 2022-05-28 ENCOUNTER — Telehealth: Payer: Self-pay

## 2022-05-28 NOTE — Telephone Encounter (Signed)
  Follow up Call-     05/27/2022    7:53 AM  Call back number  Post procedure Call Back phone  # [phone]  Permission to leave phone message Yes     Patient questions:  Do you have a fever, pain , or abdominal swelling? No. Pain Score  0 *  Have you tolerated food without any problems? Yes.    Have you been able to return to your normal activities? Yes.    Do you have any questions about your discharge instructions: Diet   No. Medications  No. Follow up visit  No.  Do you have questions or concerns about your Care? No.  Actions: * If pain score is 4 or above: No action needed, pain <4.

## 2022-06-06 ENCOUNTER — Encounter: Payer: Self-pay | Admitting: Gastroenterology

## 2022-07-17 ENCOUNTER — Other Ambulatory Visit: Payer: Self-pay | Admitting: Internal Medicine

## 2022-07-17 DIAGNOSIS — Z1231 Encounter for screening mammogram for malignant neoplasm of breast: Secondary | ICD-10-CM

## 2022-07-28 ENCOUNTER — Other Ambulatory Visit: Payer: Self-pay | Admitting: Internal Medicine

## 2022-07-28 ENCOUNTER — Inpatient Hospital Stay: Admission: RE | Admit: 2022-07-28 | Payer: Medicare Other | Source: Ambulatory Visit

## 2022-07-28 DIAGNOSIS — N6320 Unspecified lump in the left breast, unspecified quadrant: Secondary | ICD-10-CM

## 2022-09-07 ENCOUNTER — Ambulatory Visit
Admission: RE | Admit: 2022-09-07 | Discharge: 2022-09-07 | Disposition: A | Discharge status: Home / Self Care | Payer: Medicare Other | Source: Ambulatory Visit | Attending: Internal Medicine | Admitting: Internal Medicine

## 2022-09-07 ENCOUNTER — Other Ambulatory Visit: Payer: Self-pay | Admitting: Internal Medicine

## 2022-09-07 DIAGNOSIS — N6320 Unspecified lump in the left breast, unspecified quadrant: Secondary | ICD-10-CM

## 2022-09-07 DIAGNOSIS — R2232 Localized swelling, mass and lump, left upper limb: Secondary | ICD-10-CM

## 2022-11-19 ENCOUNTER — Encounter: Payer: Self-pay | Admitting: Internal Medicine

## 2022-11-19 ENCOUNTER — Ambulatory Visit: Payer: Medicare Other | Attending: Internal Medicine | Admitting: Internal Medicine

## 2022-11-19 VITALS — BP 140/80 | HR 74 | Ht 67.0 in | Wt 269.4 lb

## 2022-11-19 DIAGNOSIS — R0602 Shortness of breath: Secondary | ICD-10-CM | POA: Diagnosis not present

## 2022-11-19 DIAGNOSIS — F17200 Nicotine dependence, unspecified, uncomplicated: Secondary | ICD-10-CM | POA: Diagnosis not present

## 2022-11-19 MED ORDER — AMLODIPINE BESYLATE 5 MG PO TABS: 5.0000 mg | ORAL_TABLET | Freq: Every day | ORAL | 3 refills | Status: DC

## 2022-11-19 NOTE — Progress Notes (Signed)
Cardiology Office Note   Date:  11/19/2022   ID:  Veronica Hansen, DOB 06/07/1946, MRN [id number]  PCP:  Donnajean Lopes, MD  Cardiologist:   Dorris Carnes, MD   Patient referred for dyspnea    History of Present Illness: Veronica Hansen is a 77 y.o. female with a history of DM, HTN, OSA (using CPAP), obesity, hx atrial flutter (s/p ablation in February 07, 2018),   Hx stomach stapling in 02/07/2018   Weight down from 360     Pt is followed by Threasa Beards   Complained of DOE   Referred to cardiology   Patient says her Toprol XL was increased  to bid and BP is still high  The patient denies CP   Breathing   Hx of tobacco use  None since 22 years  Pt has noted SOB with activity   ? Weight  gets SOB walking around house.       No palpitations   No dizzienss  Went to [employer] in past      Br  Cereal  Raisin bran  Cheerios  Water Lunch   Beans feta cheese  Protein Dinner:   Left overs   Current Meds  Medication Sig   Dulaglutide (TRULICITY) 1.5 HY/8.5OY SOPN Trulicity 1.5 DX/4.1 mL subcutaneous pen injector   escitalopram (LEXAPRO) 10 MG tablet Take 10 mg by mouth daily.   ezetimibe (ZETIA) 10 MG tablet Take 10 mg by mouth daily.   ferrous sulfate 325 (65 FE) MG tablet Take 325 mg by mouth 2 (two) times daily.   fluticasone (FLONASE) 50 MCG/ACT nasal spray Place 2 sprays into both nostrils daily.   glucose blood (ONETOUCH VERIO) test strip 1 each by Other route in the morning, at noon, in the evening, and at bedtime. Use as instructed   metoprolol succinate (TOPROL-XL) 100 MG 24 hr tablet Take 100 mg by mouth 2 (two) times daily.   Multiple Vitamins-Minerals (CENTRUM ADULTS PO) Take by mouth.   Multiple Vitamins-Minerals (PRESERVISION AREDS 2+MULTI VIT PO) Take 1 tablet by mouth 2 (two) times daily.    olmesartan (BENICAR) 40 MG tablet Take 40 mg by mouth daily.   OneTouch Delica Lancets 28N MISC by Does not apply route as directed.   simvastatin (ZOCOR) 40 MG tablet Take 40 mg by  mouth daily.     Allergies:   Orlistat, Phentermine, and Phentermine hcl   Past Medical History:  Diagnosis Date   A-fib ([place])    A-fib ([place]) 07/2018   treated with ablation procedure in October 2019   Allergic rhinitis    Anemia    Arthritis    joints hurt   ASCVD (arteriosclerotic cardiovascular disease)    Atypical chest pain    cardiology consult showed an echocardiogram with left ventricular hypertrophy normal left ventricular function.   COPD (chronic obstructive pulmonary disease) ([facility])    Depression    husband died of Covid February 08, 2020   Diabetes mellitus    type 2, no longer takes medication    Diabetes mellitus ([place])    Dyspnea    GERD (gastroesophageal reflux disease)    History of blood transfusion    HTN (hypertension)    Hypercholesterolemia    Hyperlipidemia    Hypertension    Incontinence    Iron deficiency anemia    Lower esophageal sphincter, relaxation    Morbid obesity ([facility])    OSA (obstructive sleep apnea)    OSA on CPAP 03/07/2014   OSA on  CPAP    Proteinuria 2001   Psoriasis    RLL pneumonia 06/2016   Runny nose    seasonal - poss allergies   Sinus problem    SOB (shortness of breath)    Tobacco use    Stopped in 2004    Past Surgical History:  Procedure Laterality Date   A-FLUTTER ABLATION N/A 08/19/2018   Procedure: A-FLUTTER ABLATION;  Surgeon: Evans Lance, MD;  Location: [facility];  Service: Cardiovascular;  Laterality: N/A;   ATRIAL FLUTTER ABLATION     BARIATRIC SURGERY  2013   dental implants  11/16/2009   left breast bx was fibrocystic change     left cataract extraction and lens implantation  01/11/2019   ORIF ULNAR FRACTURE Right 02/15/2021   Procedure: OPEN REDUCTION INTERNAL FIXATION (ORIF) ULNAR FRACTURE and distal radius open reduction and internal fixation and repair;  Surgeon: Iran Planas, MD;  Location: [place];  Service: Orthopedics;  Laterality: Right;  with IV sedation   Right TKR  2007   TOTAL KNEE  ARTHROPLASTY  11/17/2003   left   TOTAL KNEE ARTHROPLASTY  11/16/2005   right     Social History:  The patient  reports that she quit smoking about 22 years ago. Her smoking use included cigarettes. She has never used smokeless tobacco. She reports that she does not drink alcohol and does not use drugs.   Family History:  The patient's family history includes Diabetes in her brother and father.    ROS:  Please see the history of present illness. All other systems are reviewed and  Negative to the above problem except as noted.    PHYSICAL EXAM: VS:  BP (!) 140/80   Pulse 74   Ht '5\' 7"'$  (1.702 m)   Wt 269 lb 6.4 oz (122.2 kg)   SpO2 94%   BMI 42.19 kg/m   GEN: Morbidly obese 78 yo  in no acute distress  HEENT: normal  Neck: no JVD, carotid bruits, Cardiac: RRR; no murmurs,   No LE edema  Respiratory:  clear to auscultation bilaterally  Some decreased airflow    GI: soft, nontender, nondistended, + BS  No hepatomegaly  MS: no deformity Moving all extremities   Skin: warm and dry, no rash Neuro:  Strength and sensation are intact Psych: euthymic mood, full affect   EKG:  EKG is ordered today.  SR 74 bpm   First degree AV block  PR 214 msec    Lipid Panel No results found for: "CHOL", "TRIG", "HDL", "CHOLHDL", "VLDL", "LDLCALC", "LDLDIRECT"    Wt Readings from Last 3 Encounters:  11/19/22 269 lb 6.4 oz (122.2 kg)  05/27/22 284 lb (128.8 kg)  03/30/22 284 lb (128.8 kg)      ASSESSMENT AND PLAN:  1  Dyspnea   Etiology not clear   Would set up for PFTs   Would set up for PET /CT to look at perfusion  2  HTN  BP is not optimal  At home can go to 160s     Recomm   Go back to daily toprol XL 100   Add amlodipine 5 mg    Contineu other meds  Follow up in 6 to 8 2ks   3  HL  LDL 76 on current regimen  4 M1DQ    On trulicity   Q2W 5.1  5  morbid obesity     Trying to lose wt   S/P stomach stapling  Get  rid of carbs        Current medicines are reviewed at length with  the patient today.  The patient does not have concerns regarding medicines.  Signed, Dorris Carnes, MD  11/19/2022 10:52 AM    [facility] [place], [place], [place]  [address] Phone: [phone]; Fax: [phone]

## 2022-11-19 NOTE — Patient Instructions (Addendum)
Medication Instructions: Take Metoprolol 100 mg once a day Take Amlodipine 5 mg once a day  *If you need a refill on your cardiac medications before your next appointment, please call your pharmacy*   Lab Work:   If you have labs (blood work) drawn today and your tests are completely normal, you will receive your results only by: [place] (if you have MyChart) OR A paper copy in the mail If you have any lab test that is abnormal or we need to change your treatment, we will call you to review the results.   Testing/Procedures: How to Prepare for Your Cardiac PET/CT Stress Test:  1. Please do not take these medications before your test:   - You medications may be taken with water.  2. Nothing to eat or drink, except water, 3 hours prior to arrival time.   NO caffeine/decaffeinated products, or chocolate 12 hours prior to arrival.  3. NO perfume, cologne or lotion  4. Total time is 1 to 2 hours; you may want to bring reading material for the waiting time.  5. Please report to Admitting at the [facility] 30 minutes early for your test.  [place], [place] [phone]  In preparation for your appointment, medication and supplies will be purchased.  Appointment availability is limited, so if you need to cancel or reschedule, please call the Radiology Department at [phone]  24 hours in advance to avoid a cancellation fee of $100.00  What to Expect After you Arrive:  Once you arrive and check in for your appointment, you will be taken to a preparation room within the Radiology Department.  A technologist or Nurse will obtain your medical history, verify that you are correctly prepped for the exam, and explain the procedure.  Afterwards,  an IV will be started in your arm and electrodes will be placed on your skin for EKG monitoring during the stress portion of the exam. Then you will be escorted to the PET/CT scanner.  There, staff will  get you positioned on the scanner and obtain a blood pressure and EKG.  During the exam, you will continue to be connected to the EKG and blood pressure machines.  A small, safe amount of a radioactive tracer will be injected in your IV to obtain a series of pictures of your heart along with an injection of a stress agent.    After your Exam:  It is recommended that you eat a meal and drink a caffeinated beverage to counter act any effects of the stress agent.  Drink plenty of fluids for the remainder of the day and urinate frequently for the first couple of hours after the exam.  Your doctor will inform you of your test results within 7-10 business days.  For questions about your test or how to prepare for your test, please call: Marchia Bond, Cardiac Imaging Nurse Navigator  Gordy Clement, Cardiac Imaging Nurse Navigator Office: [phone]  Your physician has recommended that you have a pulmonary function test. Pulmonary Function Tests are a group of tests that measure how well air moves in and out of your lungs.    Follow-Up: At [facility], you and your health needs are our priority.  As part of our continuing mission to provide you with exceptional heart care, we have created designated Provider Care Teams.  These Care Teams include your primary Cardiologist (physician) and Advanced Practice Providers (APPs -  Physician Assistants and Nurse Practitioners)  who all work together to provide you with the care you need, when you need it.  We recommend signing up for the patient portal called "MyChart".  Sign up information is provided on this After Visit Summary.  MyChart is used to connect with patients for Virtual Visits (Telemedicine).  Patients are able to view lab/test results, encounter notes, upcoming appointments, etc.  Non-urgent messages can be sent to your provider as well.   To learn more about what you can do with MyChart, go to [email or web address].    Your next  appointment:   8 week(s)  The format for your next appointment:   In Person  Provider:   Dr Dorris Carnes     Other [place]

## 2022-12-04 ENCOUNTER — Ambulatory Visit: Payer: Medicare Other | Admitting: Internal Medicine

## 2022-12-04 DIAGNOSIS — F17200 Nicotine dependence, unspecified, uncomplicated: Secondary | ICD-10-CM | POA: Diagnosis not present

## 2022-12-04 DIAGNOSIS — R0602 Shortness of breath: Secondary | ICD-10-CM

## 2022-12-04 LAB — PULMONARY FUNCTION TEST
DL/VA % pred: 77 %
DL/VA: 3.13 ml/min/mmHg/L
DLCO cor % pred: 82 %
DLCO cor: 17.45 ml/min/mmHg
DLCO unc % pred: 82 %
DLCO unc: 17.45 ml/min/mmHg
FEF 25-75 Post: 1.87 L/sec
FEF 25-75 Pre: 1.31 L/sec
FEF2575-%Change-Post: 43 %
FEF2575-%Pred-Post: 99 %
FEF2575-%Pred-Pre: 69 %
FEV1-%Change-Post: 9 %
FEV1-%Pred-Post: 95 %
FEV1-%Pred-Pre: 87 %
FEV1-Post: 2.33 L
FEV1-Pre: 2.12 L
FEV1FVC-%Change-Post: 3 %
FEV1FVC-%Pred-Pre: 91 %
FEV6-%Change-Post: 7 %
FEV6-%Pred-Post: 105 %
FEV6-%Pred-Pre: 98 %
FEV6-Post: 3.24 L
FEV6-Pre: 3.02 L
FEV6FVC-%Change-Post: 1 %
FEV6FVC-%Pred-Post: 104 %
FEV6FVC-%Pred-Pre: 103 %
FVC-%Change-Post: 5 %
FVC-%Pred-Post: 100 %
FVC-%Pred-Pre: 95 %
FVC-Post: 3.24 L
FVC-Pre: 3.07 L
Post FEV1/FVC ratio: 72 %
Post FEV6/FVC ratio: 100 %
Pre FEV1/FVC ratio: 69 %
Pre FEV6/FVC Ratio: 99 %
RV % pred: 142 %
RV: 3.45 L
TLC % pred: 123 %
TLC: 6.78 L

## 2022-12-04 NOTE — Patient Instructions (Signed)
Full PFT performed today.

## 2022-12-04 NOTE — Progress Notes (Signed)
Full PFT performed today.

## 2022-12-07 ENCOUNTER — Telehealth (HOSPITAL_COMMUNITY): Payer: Self-pay | Admitting: Emergency Medicine

## 2022-12-07 NOTE — Telephone Encounter (Signed)
Attempted to call patient regarding upcoming cardiac PET appointment. Left message on voicemail with name and callback number Annalea Alguire RN Navigator Cardiac Imaging [facility] [phone] Office [phone] Cell  

## 2022-12-08 ENCOUNTER — Encounter (HOSPITAL_COMMUNITY)
Admission: RE | Admit: 2022-12-08 | Discharge: 2022-12-08 | Disposition: A | Discharge status: Home / Self Care | Payer: Medicare Other | Source: Ambulatory Visit | Attending: Internal Medicine | Admitting: Internal Medicine

## 2022-12-08 DIAGNOSIS — R0602 Shortness of breath: Secondary | ICD-10-CM

## 2022-12-08 DIAGNOSIS — F17200 Nicotine dependence, unspecified, uncomplicated: Secondary | ICD-10-CM | POA: Diagnosis not present

## 2022-12-08 LAB — NM PET CT CARDIAC PERFUSION MULTI W/ABSOLUTE BLOODFLOW
LV dias vol: 128 mL (ref 46–106)
LV sys vol: 52 mL
MBFR: 2.34
Nuc Rest EF: 59 %
Nuc Stress EF: 65 %
Rest MBF: 0.99 ml/g/min
Rest Nuclear Isotope Dose: 30.2 mCi
ST Depression (mm): 0 mm
Stress MBF: 2.32 ml/g/min
Stress Nuclear Isotope Dose: 30.1 mCi

## 2022-12-08 MED ORDER — RUBIDIUM RB82 GENERATOR (RUBYFILL)
30.1000 | PACK | Freq: Once | INTRAVENOUS | Status: AC
  Administered 2022-12-08: 30.1 via INTRAVENOUS

## 2022-12-08 MED ORDER — REGADENOSON 0.4 MG/5ML IV SOLN
INTRAVENOUS | Status: AC
  Filled 2022-12-08: qty 5

## 2022-12-08 MED ORDER — RUBIDIUM RB82 GENERATOR (RUBYFILL)
30.2000 | PACK | Freq: Once | INTRAVENOUS | Status: AC
  Administered 2022-12-08: 30.2 via INTRAVENOUS

## 2022-12-09 ENCOUNTER — Telehealth: Payer: Self-pay

## 2022-12-09 NOTE — Telephone Encounter (Signed)
The patient has been notified of the result and verbalized understanding.  All questions (if any) were answered. Gershon Crane, LPN 2/95/7473 4:03 AM

## 2022-12-10 ENCOUNTER — Other Ambulatory Visit: Payer: Self-pay

## 2022-12-10 DIAGNOSIS — Z01810 Encounter for preprocedural cardiovascular examination: Secondary | ICD-10-CM

## 2022-12-10 DIAGNOSIS — I779 Disorder of arteries and arterioles, unspecified: Secondary | ICD-10-CM

## 2023-01-24 NOTE — Progress Notes (Unsigned)
Cardiology Office Note   Date:  01/25/2023   ID:  Veronica Hansen, DOB 08/28/1946, MRN [id number]  PCP:  Donnajean Lopes, MD  Cardiologist:   Dorris Carnes, MD   Patient presents for follow up of HTN    History of Present Illness: Veronica Hansen is a 77 y.o. female with a history of DM, HTN, OSA (using CPAP), obesity, hx atrial flutter (s/p ablation in 2018/02/17),   Hx stomach stapling in 17-Feb-2018   Weight down from 360     Pt is followed by Threasa Beards   Complained of DOE   Referred to cardiology I saw the pt in January   She was set up for a PET CT to evaluate perfusion   This was normal  with normal flow reserve   LVEF 59% increasing to 65%   Aorta was dilated at 4.6 cm       PFTs also done  Normal     Sinc seen she has done well  No CP   Breathing is OK  She has cut out carbs    She is doing water aerobics    I saw the pt in Jan 2024   Since seen she continues to lose wt  Working on diet   Minimizing  carbs     Getting protein  Current Meds  Medication Sig   amLODipine (NORVASC) 5 MG tablet Take 1 tablet (5 mg total) by mouth daily.   escitalopram (LEXAPRO) 10 MG tablet Take 10 mg by mouth daily.   ezetimibe (ZETIA) 10 MG tablet Take 10 mg by mouth daily.   ferrous sulfate 325 (65 FE) MG tablet Take 325 mg by mouth 2 (two) times daily.   fluticasone (FLONASE) 50 MCG/ACT nasal spray Place 2 sprays into both nostrils daily.   glucose blood (ONETOUCH VERIO) test strip 1 each by Other route in the morning, at noon, in the evening, and at bedtime. Use as instructed   metoprolol succinate (TOPROL-XL) 100 MG 24 hr tablet Take 100 mg by mouth daily.   Multiple Vitamins-Minerals (CENTRUM ADULTS PO) Take by mouth.   Multiple Vitamins-Minerals (PRESERVISION AREDS 2+MULTI VIT PO) Take 1 tablet by mouth 2 (two) times daily.    olmesartan (BENICAR) 40 MG tablet Take 40 mg by mouth daily.   omeprazole (PRILOSEC) 20 MG capsule Take 1 capsule by mouth daily.   OneTouch Delica Lancets 99991111 MISC  by Does not apply route as directed.   simvastatin (ZOCOR) 40 MG tablet Take 40 mg by mouth daily.   TRULICITY 4.5 0000000 SOPN inject 4.'5mg'$  Subcutaneous weekly     Allergies:   Orlistat, Phentermine, and Phentermine hcl   Past Medical History:  Diagnosis Date   A-fib ([place])    A-fib ([place]) 07/2018   treated with ablation procedure in October 2019   Allergic rhinitis    Anemia    Arthritis    joints hurt   ASCVD (arteriosclerotic cardiovascular disease)    Atypical chest pain    cardiology consult showed an echocardiogram with left ventricular hypertrophy normal left ventricular function.   COPD (chronic obstructive pulmonary disease) ([place])    Depression    husband died of Covid February 18, 2020   Diabetes mellitus    type 2, no longer takes medication    Diabetes mellitus ([place])    Dyspnea    GERD (gastroesophageal reflux disease)    History of blood transfusion    HTN (hypertension)    Hypercholesterolemia    Hyperlipidemia  Hypertension    Incontinence    Iron deficiency anemia    Lower esophageal sphincter, relaxation    Morbid obesity ([facility])    OSA (obstructive sleep apnea)    OSA on CPAP 03/07/2014   OSA on CPAP    Proteinuria 2001   Psoriasis    RLL pneumonia 06/2016   Runny nose    seasonal - poss allergies   Sinus problem    SOB (shortness of breath)    Tobacco use    Stopped in 2004    Past Surgical History:  Procedure Laterality Date   A-FLUTTER ABLATION N/A 08/19/2018   Procedure: A-FLUTTER ABLATION;  Surgeon: Evans Lance, MD;  Location: [facility];  Service: Cardiovascular;  Laterality: N/A;   ATRIAL FLUTTER ABLATION     BARIATRIC SURGERY  2013   dental implants  11/16/2009   left breast bx was fibrocystic change     left cataract extraction and lens implantation  01/11/2019   ORIF ULNAR FRACTURE Right 02/15/2021   Procedure: OPEN REDUCTION INTERNAL FIXATION (ORIF) ULNAR FRACTURE and distal radius open reduction and internal fixation and  repair;  Surgeon: Iran Planas, MD;  Location: [facility];  Service: Orthopedics;  Laterality: Right;  with IV sedation   Right TKR  2007   TOTAL KNEE ARTHROPLASTY  11/17/2003   left   TOTAL KNEE ARTHROPLASTY  11/16/2005   right     Social History:  The patient  reports that she quit smoking about 22 years ago. Her smoking use included cigarettes. She has never used smokeless tobacco. She reports that she does not drink alcohol and does not use drugs.   Family History:  The patient's family history includes Diabetes in her brother and father.    ROS:  Please see the history of present illness. All other systems are reviewed and  Negative to the above problem except as noted.    PHYSICAL EXAM: VS:  BP 128/78   Pulse 68   Ht '5\' 7"'$  (1.702 m)   Wt 254 lb 12.8 oz (115.6 kg)   SpO2 95%   BMI 39.91 kg/m   GEN: Morbidly obese 77 yo  in no acute distress  HEENT: normal  Neck: no JVD, carotid bruit Cardiac: RRR; no murmur.  No LE  edema 2+ PT pulses   Respiratory:  clear to auscultation bilaterally   GI: soft, nontender, nondistended, + BS  No hepatomegaly   EKG:  EKG is not ordered   Lipid Panel No results found for: "CHOL", "TRIG", "HDL", "CHOLHDL", "VLDL", "LDLCALC", "LDLDIRECT"    Wt Readings from Last 3 Encounters:  01/25/23 254 lb 12.8 oz (115.6 kg)  11/19/22 269 lb 6.4 oz (122.2 kg)  05/27/22 284 lb (128.8 kg)      ASSESSMENT AND PLAN:  1  Dyspnea   Pt denies signifciant dyspnea   Working out in Dietitian (PET/CT and PFTs normal, except for aorta being dilated)  2  HTN  BP is good  Keep on current regimen     Follow as loses wt  3  Aortic aneurysm   Will get CT in July 2024  4  HL  LDL 70  HDL 43   Trig 102  Keep on statin  4  DM  A1C 5      5  morbid obesity  Losing weight with Trulicity and diet changes and exercise   Congratulated     Follow up Late nov/ December 2024  Current medicines are reviewed at length with the patient today.  The patient does  not have concerns regarding medicines.  Signed, Dorris Carnes, MD  01/25/2023 9:27 AM    [name], [place], [place]  [id number] Phone: [phone]; Fax: [phone]

## 2023-01-25 ENCOUNTER — Encounter: Payer: Self-pay | Admitting: Internal Medicine

## 2023-01-25 ENCOUNTER — Ambulatory Visit: Payer: Medicare Other | Attending: Internal Medicine | Admitting: Internal Medicine

## 2023-01-25 VITALS — BP 128/78 | HR 68 | Ht 67.0 in | Wt 254.8 lb

## 2023-01-25 DIAGNOSIS — I779 Disorder of arteries and arterioles, unspecified: Secondary | ICD-10-CM

## 2023-01-25 NOTE — Patient Instructions (Signed)
Medication Instructions:   *If you need a refill on your cardiac medications before your next appointment, please call your pharmacy*   Lab Work:  If you have labs (blood work) drawn today and your tests are completely normal, you will receive your results only by: [place] (if you have MyChart) OR A paper copy in the mail If you have any lab test that is abnormal or we need to change your treatment, we will call you to review the results.   Testing/Procedures: Gated CT of the Aorta in July 2024   Follow-Up: At [facility], you and your health needs are our priority.  As part of our continuing mission to provide you with exceptional heart care, we have created designated Provider Care Teams.  These Care Teams include your primary Cardiologist (physician) and Advanced Practice Providers (APPs -  Physician Assistants and Nurse Practitioners) who all work together to provide you with the care you need, when you need it.  We recommend signing up for the patient portal called "MyChart".  Sign up information is provided on this After Visit Summary.  MyChart is used to connect with patients for Virtual Visits (Telemedicine).  Patients are able to view lab/test results, encounter notes, upcoming appointments, etc.  Non-urgent messages can be sent to your provider as well.   To learn more about what you can do with MyChart, go to [email or web address].    Your next appointment:   8 month(s)  Provider:   Dr Dorris Carnes    Other Instructions

## 2023-04-21 ENCOUNTER — Telehealth: Payer: Self-pay

## 2023-04-21 NOTE — Telephone Encounter (Signed)
Spoke with patient who is agreeable to do tele visit on 7/3 at 9:20 am. Med rec and consent done.

## 2023-04-21 NOTE — Telephone Encounter (Signed)
  Patient Consent for Virtual Visit        Veronica Hansen has provided verbal consent on 04/21/2023 for a virtual visit (video or telephone).   CONSENT FOR VIRTUAL VISIT FOR:  Veronica Hansen  By participating in this virtual visit I agree to the following:  I hereby voluntarily request, consent and authorize [facility] and its employed or contracted physicians, physician assistants, nurse practitioners or other licensed health care professionals (the Practitioner), to provide me with telemedicine health care services (the "Services") as deemed necessary by the treating Practitioner. I acknowledge and consent to receive the Services by the Practitioner via telemedicine. I understand that the telemedicine visit will involve communicating with the Practitioner through live audiovisual communication technology and the disclosure of certain medical information by electronic transmission. I acknowledge that I have been given the opportunity to request an in-person assessment or other available alternative prior to the telemedicine visit and am voluntarily participating in the telemedicine visit.  I understand that I have the right to withhold or withdraw my consent to the use of telemedicine in the course of my care at any time, without affecting my right to future care or treatment, and that the Practitioner or I may terminate the telemedicine visit at any time. I understand that I have the right to inspect all information obtained and/or recorded in the course of the telemedicine visit and may receive copies of available information for a reasonable fee.  I understand that some of the potential risks of receiving the Services via telemedicine include:  Delay or interruption in medical evaluation due to technological equipment failure or disruption; Information transmitted may not be sufficient (e.g. poor resolution of images) to allow for appropriate medical decision making by the  Practitioner; and/or  In rare instances, security protocols could fail, causing a breach of personal health information.  Furthermore, I acknowledge that it is my responsibility to provide information about my medical history, conditions and care that is complete and accurate to the best of my ability. I acknowledge that Practitioner's advice, recommendations, and/or decision may be based on factors not within their control, such as incomplete or inaccurate data provided by me or distortions of diagnostic images or specimens that may result from electronic transmissions. I understand that the practice of medicine is not an exact science and that Practitioner makes no warranties or guarantees regarding treatment outcomes. I acknowledge that a copy of this consent can be made available to me via my patient portal [facility] MyChart), or I can request a printed copy by calling the office of [facility].    I understand that my insurance will be billed for this visit.   I have read or had this consent read to me. I understand the contents of this consent, which adequately explains the benefits and risks of the Services being provided via telemedicine.  I have been provided ample opportunity to ask questions regarding this consent and the Services and have had my questions answered to my satisfaction. I give my informed consent for the services to be provided through the use of telemedicine in my medical care

## 2023-04-21 NOTE — Telephone Encounter (Signed)
   Name: Veronica Hansen  DOB: 01-25-1946  MRN: [id number]  Primary Cardiologist: None   Preoperative team, please contact this patient and set up a phone call appointment for further preoperative risk assessment. Please obtain consent and complete medication review. Thank you for your help.  I confirm that guidance regarding antiplatelet and oral anticoagulation therapy has been completed and, if necessary, noted below.  None requested.    Carlos Levering, NP 04/21/2023, 4:37 PM [facility]

## 2023-04-21 NOTE — Telephone Encounter (Signed)
   Pre-operative Risk Assessment    Patient Name: Veronica Hansen  DOB: June 14, 1946 MRN: [id number]      Request for Surgical Clearance    Procedure:   RIGHT TOTAL ARTHOPLASTY  Date of Surgery:  Clearance 06/03/23                                 Surgeon:  DR. MATTHEW OLIN Surgeon's Group or Practice Name:  [facility] Phone number:  [phone] Fax number:  [phone]   Type of Clearance Requested:   - Medical    Type of Anesthesia:  Spinal   Additional requests/questions:    SignedMichaelle Copas   04/21/2023, 10:37 AM

## 2023-05-19 ENCOUNTER — Encounter: Payer: Self-pay | Admitting: Student

## 2023-05-19 ENCOUNTER — Ambulatory Visit: Payer: Medicare Other | Attending: Cardiovascular Disease | Admitting: Student

## 2023-05-19 DIAGNOSIS — Z01810 Encounter for preprocedural cardiovascular examination: Secondary | ICD-10-CM | POA: Diagnosis not present

## 2023-05-19 NOTE — Progress Notes (Signed)
Virtual Visit via Telephone Note   Because of Veronica Hansen's co-morbid illnesses, she is at least at moderate risk for complications without adequate follow up.  This format is felt to be most appropriate for this patient at this time.  The patient did not have access to video technology/had technical difficulties with video requiring transitioning to audio format only (telephone).  All issues noted in this document were discussed and addressed.  No physical exam could be performed with this format.  Please refer to the patient's chart for her consent to telehealth for Veronica Hansen.  Evaluation Performed:  Preoperative cardiovascular risk assessment _____________   Date:  05/19/2023   Patient ID:  Veronica Hansen, DOB Jul 29, 1946, MRN [id number] Patient Location:  Home Provider location:   Office  Primary Care Provider:  Garlan Fillers, MD Primary Cardiologist:  Dietrich Pates, MD  Chief Complaint / Patient Profile   77 y.o. y/o female with a h/o a-flutter s/p ablation 06/07/18, hypertension, T2DM, OSA on CPAP who is pending right total hip arthroplasty by Dr. Charlann Boxer and presents today for telephonic preoperative cardiovascular risk assessment.  History of Present Illness    Veronica Hansen is a 77 y.o. female who presents via audio/video conferencing for a telehealth visit today.  Pt was last seen in cardiology clinic on 01/25/2023 by Dr. Tenny Craw.  At that time Veronica Hansen was doing well.  The patient is now pending procedure as outlined above. Since her last visit, she is doing well. Patient denies shortness of breath or dyspnea on exertion. No chest pain, pressure, or tightness. Denies lower extremity edema, orthopnea, or PND. No palpitations. She is very active doing two 1 hour classes of water aerobics 3 times a week.   Past Medical History    Past Medical History:  Diagnosis Date   A-fib (H[facility])    A-fib ([facility]) 07/2018   treated with ablation procedure in  October 2019   Allergic rhinitis    Anemia    Arthritis    joints hurt   ASCVD (arteriosclerotic cardiovascular disease)    Atypical chest pain    cardiology consult showed an echocardiogram with left ventricular hypertrophy normal left ventricular function.   COPD (chronic obstructive pulmonary disease) (HCC)    Depression    husband died of Covid 07-Jun-2020   Diabetes mellitus    type 2, no longer takes medication    Diabetes mellitus (HCC)    Dyspnea    GERD (gastroesophageal reflux disease)    History of blood transfusion    HTN (hypertension)    Hypercholesterolemia    Hyperlipidemia    Hypertension    Incontinence    Iron deficiency anemia    Lower esophageal sphincter, relaxation    Morbid obesity (HCC)    OSA (obstructive sleep apnea)    OSA on CPAP 03/07/2014   OSA on CPAP    Proteinuria 2001   Psoriasis    RLL pneumonia 06/2016   Runny nose    seasonal - poss allergies   Sinus problem    SOB (shortness of breath)    Tobacco use    Stopped in 06-08-03   Past Surgical History:  Procedure Laterality Date   A-FLUTTER ABLATION N/A 08/19/2018   Procedure: A-FLUTTER ABLATION;  Surgeon: Marinus Maw, MD;  Location: [facility];  Service: Cardiovascular;  Laterality: N/A;   ATRIAL FLUTTER ABLATION     BARIATRIC SURGERY  06-07-2012   dental implants  11/16/2009  left breast bx was fibrocystic change     left cataract extraction and lens implantation  01/11/2019   ORIF ULNAR FRACTURE Right 02/15/2021   Procedure: OPEN REDUCTION INTERNAL FIXATION (ORIF) ULNAR FRACTURE and distal radius open reduction and internal fixation and repair;  Surgeon: Bradly Bienenstock, MD;  Location: [facility];  Service: Orthopedics;  Laterality: Right;  with IV sedation   Right TKR  2007   TOTAL KNEE ARTHROPLASTY  11/17/2003   left   TOTAL KNEE ARTHROPLASTY  11/16/2005   right    Allergies  Allergies  Allergen Reactions   Orlistat     Other reaction(s): severe diarrhea   Phentermine  Hypertension   Phentermine Hcl     Other reaction(s): elevated BP    Home Medications    Prior to Admission medications   Medication Sig Start Date End Date Taking? Authorizing Provider  amLODipine (NORVASC) 5 MG tablet Take 1 tablet (5 mg total) by mouth daily. 11/19/22   Pricilla Riffle, MD  escitalopram (LEXAPRO) 10 MG tablet Take 10 mg by mouth daily. 10/04/20   [provider]  ezetimibe (ZETIA) 10 MG tablet Take 10 mg by mouth daily. 01/09/21   [provider]  ferrous sulfate 325 (65 FE) MG tablet Take 325 mg by mouth 2 (two) times daily.    [provider]  fluticasone (FLONASE) 50 MCG/ACT nasal spray Place 2 sprays into both nostrils daily.    [provider]  glucose blood (ONETOUCH VERIO) test strip 1 each by Other route in the morning, at noon, in the evening, and at bedtime. Use as instructed    [provider]  metoprolol succinate (TOPROL-XL) 100 MG 24 hr tablet Take 100 mg by mouth daily. 02/05/21   [provider]  Multiple Vitamins-Minerals (CENTRUM ADULTS PO) Take by mouth.    [provider]  Multiple Vitamins-Minerals (PRESERVISION AREDS 2+MULTI VIT PO) Take 1 tablet by mouth 2 (two) times daily.     [provider]  olmesartan (BENICAR) 40 MG tablet Take 40 mg by mouth daily. 01/09/21   [provider]  omeprazole (PRILOSEC) 20 MG capsule Take 1 capsule by mouth daily. 02/21/14   [provider]  OneTouch Delica Lancets 33G MISC by Does not apply route as directed.    [provider]  simvastatin (ZOCOR) 40 MG tablet Take 40 mg by mouth daily.    [provider]  TRULICITY 4.5 MG/0.5ML SOPN inject 4.5mg  Subcutaneous weekly 07/14/22   [provider]    Physical Exam    Vital Signs:  Veronica Hansen does not have vital signs available for review today.  Given telephonic nature of communication, physical exam is limited. AAOx3. NAD. Normal affect.  Speech  and respirations are unlabored.  Accessory Clinical Findings    None  Assessment & Plan    Primary Cardiologist: Dietrich Pates, MD  Preoperative cardiovascular risk assessment. Right total hip arthroplasty by Dr. Charlann Boxer.   Chart reviewed as part of pre-operative protocol coverage. According to the RCRI, patient has a 0.4% risk of MACE. Patient reports activity equivalent to >4.0 METS (water aerobics for 2 hours 3 days a week).   Given past medical history and time since last visit, based on ACC/AHA guidelines, Veronica Hansen would be at acceptable risk for the planned procedure without further cardiovascular testing.   Patient was advised that if she develops new symptoms prior to surgery to contact our office to arrange a follow-up appointment.  she  verbalized understanding.   I will route this recommendation to the requesting party via Epic fax function.  Please call with questions.  Time:   Today, I have spent 5 minutes with the patient with telehealth technology discussing medical history, symptoms, and management plan.     Carlos Levering, NP  05/19/2023, 9:32 AM

## 2023-05-21 NOTE — Progress Notes (Addendum)
COVID Vaccine Completed: yes  Date of COVID positive in last 90 days: no  PCP - Jarome Matin, MD Cardiologist - Dietrich Pates, MD LOV 01/25/23 Pulmonologist- Jetty Duhamel, MD  Cardiac clearance by Carlos Levering, NP 05/19/23 in Epic Medical clearance by Dr. Vonita Moss 04/22/23 on chart  Chest x-ray - n/a EKG - 11/19/22 Epic Stress Test - n/a ECHO - 08/10/18 Epic Cardiac Cath - n/a Pacemaker/ICD device last checked: n/a Spinal Cord Stimulator: n/a  Bowel Prep - no  Sleep Study - yes CPAP - yes every night   Fasting Blood Sugar -  Checks Blood Sugar not checking at home   Last dose of GLP1 agonist-  Trulicity, takes Mondays  GLP1 instructions:  hold 7 days, hold 05/31/23. Last dose 05/24/23   Last dose of SGLT-2 inhibitors-  N/A SGLT-2 instructions: N/A   Blood Thinner Instructions:  n/a Aspirin Instructions: Last Dose:  Activity level: Can perform activities of daily living without stopping and without symptoms of chest pain or shortness of breath. No stairs currently due to hip pain  Anesthesia review: DM, OSA, a fib, COPD, SOB, 1st degree AV block  Patient denies shortness of breath, fever, cough and chest pain at PAT appointment  Patient verbalized understanding of instructions that were given to them at the PAT appointment. Patient was also instructed that they will need to review over the PAT instructions again at home before surgery.

## 2023-05-25 ENCOUNTER — Encounter (HOSPITAL_COMMUNITY)
Admission: RE | Admit: 2023-05-25 | Discharge: 2023-05-25 | Disposition: A | Discharge status: Home / Self Care | Payer: Medicare Other | Source: Ambulatory Visit | Attending: Orthopedic Surgery | Admitting: Orthopedic Surgery

## 2023-05-25 ENCOUNTER — Other Ambulatory Visit: Payer: Self-pay

## 2023-05-25 ENCOUNTER — Encounter (HOSPITAL_COMMUNITY): Payer: Self-pay

## 2023-05-25 VITALS — BP 116/67 | HR 62 | Temp 97.7°F | Resp 16 | Ht 65.5 in | Wt 238.0 lb

## 2023-05-25 DIAGNOSIS — I4892 Unspecified atrial flutter: Secondary | ICD-10-CM | POA: Insufficient documentation

## 2023-05-25 DIAGNOSIS — Z903 Acquired absence of stomach [part of]: Secondary | ICD-10-CM | POA: Insufficient documentation

## 2023-05-25 DIAGNOSIS — I10 Essential (primary) hypertension: Secondary | ICD-10-CM | POA: Insufficient documentation

## 2023-05-25 DIAGNOSIS — G4733 Obstructive sleep apnea (adult) (pediatric): Secondary | ICD-10-CM | POA: Insufficient documentation

## 2023-05-25 DIAGNOSIS — Z01812 Encounter for preprocedural laboratory examination: Secondary | ICD-10-CM | POA: Diagnosis not present

## 2023-05-25 DIAGNOSIS — I7121 Aneurysm of the ascending aorta, without rupture: Secondary | ICD-10-CM | POA: Insufficient documentation

## 2023-05-25 DIAGNOSIS — E119 Type 2 diabetes mellitus without complications: Secondary | ICD-10-CM | POA: Insufficient documentation

## 2023-05-25 DIAGNOSIS — I2510 Atherosclerotic heart disease of native coronary artery without angina pectoris: Secondary | ICD-10-CM | POA: Diagnosis not present

## 2023-05-25 DIAGNOSIS — J449 Chronic obstructive pulmonary disease, unspecified: Secondary | ICD-10-CM | POA: Diagnosis not present

## 2023-05-25 DIAGNOSIS — K449 Diaphragmatic hernia without obstruction or gangrene: Secondary | ICD-10-CM | POA: Diagnosis not present

## 2023-05-25 DIAGNOSIS — M47814 Spondylosis without myelopathy or radiculopathy, thoracic region: Secondary | ICD-10-CM | POA: Insufficient documentation

## 2023-05-25 DIAGNOSIS — I700 Atherosclerosis of aorta: Secondary | ICD-10-CM | POA: Insufficient documentation

## 2023-05-25 DIAGNOSIS — J432 Centrilobular emphysema: Secondary | ICD-10-CM | POA: Diagnosis not present

## 2023-05-25 DIAGNOSIS — Z87891 Personal history of nicotine dependence: Secondary | ICD-10-CM | POA: Diagnosis not present

## 2023-05-25 DIAGNOSIS — K219 Gastro-esophageal reflux disease without esophagitis: Secondary | ICD-10-CM | POA: Insufficient documentation

## 2023-05-25 DIAGNOSIS — M1611 Unilateral primary osteoarthritis, right hip: Secondary | ICD-10-CM | POA: Diagnosis not present

## 2023-05-25 DIAGNOSIS — Z01818 Encounter for other preprocedural examination: Secondary | ICD-10-CM

## 2023-05-25 DIAGNOSIS — Z9884 Bariatric surgery status: Secondary | ICD-10-CM | POA: Insufficient documentation

## 2023-05-25 HISTORY — DX: Malignant (primary) neoplasm, unspecified: C80.1

## 2023-05-25 LAB — CBC
HCT: 37.9 % (ref 36.0–46.0)
Hemoglobin: 11.5 g/dL — ABNORMAL LOW (ref 12.0–15.0)
MCH: 24.1 pg — ABNORMAL LOW (ref 26.0–34.0)
MCHC: 30.3 g/dL (ref 30.0–36.0)
MCV: 79.5 fL — ABNORMAL LOW (ref 80.0–100.0)
Platelets: 252 10*3/uL (ref 150–400)
RBC: 4.77 MIL/uL (ref 3.87–5.11)
RDW: 17.3 % — ABNORMAL HIGH (ref 11.5–15.5)
WBC: 9.9 10*3/uL (ref 4.0–10.5)
nRBC: 0 % (ref 0.0–0.2)

## 2023-05-25 LAB — BASIC METABOLIC PANEL
Anion gap: 8 (ref 5–15)
BUN: 11 mg/dL (ref 8–23)
CO2: 24 mmol/L (ref 22–32)
Calcium: 9.2 mg/dL (ref 8.9–10.3)
Chloride: 103 mmol/L (ref 98–111)
Creatinine, Ser: 0.52 mg/dL (ref 0.44–1.00)
GFR, Estimated: >60 mL/min (ref >60)
Glucose, Bld: 82 mg/dL (ref 70–99)
Potassium: 4.3 mmol/L (ref 3.5–5.1)
Sodium: 135 mmol/L (ref 135–145)

## 2023-05-25 LAB — SURGICAL PCR SCREEN
MRSA, PCR: NEGATIVE
Staphylococcus aureus: NEGATIVE

## 2023-05-25 LAB — GLUCOSE, CAPILLARY: Glucose-Capillary: 89 mg/dL (ref 70–99)

## 2023-05-25 LAB — HEMOGLOBIN A1C
Hgb A1c MFr Bld: 5.2 % (ref 4.8–5.6)
Mean Plasma Glucose: 103 mg/dL

## 2023-05-25 NOTE — Patient Instructions (Signed)
SURGICAL WAITING ROOM VISITATION  Patients having surgery or a procedure may have no more than 2 support people in the waiting area - these visitors may rotate.    Children under the age of 78 must have an adult with them who is not the patient.  Due to an increase in RSV and influenza rates and associated hospitalizations, children ages 6 and under may not visit patients in [facility] hospitals.  If the patient needs to stay at the hospital during part of their recovery, the visitor guidelines for inpatient rooms apply. Pre-op nurse will coordinate an appropriate time for 1 support person to accompany patient in pre-op.  This support person may not rotate.    Please refer to the [facility] website for the visitor guidelines for Inpatients (after your surgery is over and you are in a regular room).    Your procedure is scheduled on: 06/03/23   Report to [facility] Main Entrance    Report to admitting at 6:10 AM   Call this number if you have problems the morning of surgery [phone]   Do not eat food :After Midnight.   After Midnight you may have the following liquids until 5:40 AM DAY OF SURGERY  Water Non-Citrus Juices (without pulp, NO RED-Apple, White grape, White cranberry) Black Coffee (NO MILK/CREAM OR CREAMERS, sugar ok)  Clear Tea (NO MILK/CREAM OR CREAMERS, sugar ok) regular and decaf                             Plain Jell-O (NO RED)                                           Fruit ices (not with fruit pulp, NO RED)                                     Popsicles (NO RED)                                                               Sports drinks like Gatorade (NO RED)     The day of surgery:  Drink ONE (1) Pre-Surgery G2 at 5:40 AM the morning of surgery. Drink in one sitting. Do not sip.  This drink was given to you during your hospital  pre-op appointment visit. Nothing else to drink after completing the  Pre-Surgery G2.          If you have  questions, please contact your surgeon's office.   FOLLOW BOWEL PREP AND ANY ADDITIONAL PRE OP INSTRUCTIONS YOU RECEIVED FROM YOUR SURGEON'S OFFICE!!!     Oral Hygiene is also important to reduce your risk of infection.                                    Remember - BRUSH YOUR TEETH THE MORNING OF SURGERY WITH YOUR REGULAR TOOTHPASTE  DENTURES WILL BE REMOVED PRIOR TO SURGERY PLEASE DO NOT APPLY "Poly grip" OR ADHESIVES!!!  Take these medicines the morning of surgery with A SIP OF WATER: Amlodipine, Lexapro, Zetia, Metoprolol, Simvastatin   DO NOT TAKE ANY ORAL DIABETIC MEDICATIONS DAY OF YOUR SURGERY  How to Manage Your Diabetes Before and After Surgery  Why is it important to control my blood sugar before and after surgery? Improving blood sugar levels before and after surgery helps healing and can limit problems. A way of improving blood sugar control is eating a healthy diet by:  Eating less sugar and carbohydrates  Increasing activity/exercise  Talking with your doctor about reaching your blood sugar goals High blood sugars (greater than 180 mg/dL) can raise your risk of infections and slow your recovery, so you will need to focus on controlling your diabetes during the weeks before surgery. Make sure that the doctor who takes care of your diabetes knows about your planned surgery including the date and location.  How do I manage my blood sugar before surgery? Check your blood sugar at least 4 times a day, starting 2 days before surgery, to make sure that the level is not too high or low. Check your blood sugar the morning of your surgery when you wake up and every 2 hours until you get to the Short Stay unit. If your blood sugar is less than 70 mg/dL, you will need to treat for low blood sugar: Do not take insulin. Treat a low blood sugar (less than 70 mg/dL) with  cup of clear juice (cranberry or apple), 4 glucose tablets, OR glucose gel. Recheck blood sugar in 15 minutes  after treatment (to make sure it is greater than 70 mg/dL). If your blood sugar is not greater than 70 mg/dL on recheck, call [phone] for further instructions. Report your blood sugar to the short stay nurse when you get to Short Stay.  If you are admitted to the hospital after surgery: Your blood sugar will be checked by the staff and you will probably be given insulin after surgery (instead of oral diabetes medicines) to make sure you have good blood sugar levels. The goal for blood sugar control after surgery is 80-180 mg/dL.   WHAT DO I DO ABOUT MY DIABETES MEDICATION?  Do not take oral diabetes medicines (pills) the morning of surgery.  Hold Trulicity 7 days prior to surgery. Do not take 05/30/23  Reviewed and Endorsed by [facility] Patient Education Committee, August 2015  Bring CPAP mask and tubing day of surgery.                              You may not have any metal on your body including hair pins, jewelry, and body piercing             Do not wear make-up, lotions, powders, perfumes, or deodorant  Do not wear nail polish including gel and S&S, artificial/acrylic nails, or any other type of covering on natural nails including finger and toenails. If you have artificial nails, gel coating, etc. that needs to be removed by a nail salon please have this removed prior to surgery or surgery may need to be canceled/ delayed if the surgeon/ anesthesia feels like they are unable to be safely monitored.   Do not shave  48 hours prior to surgery.    Do not bring valuables to the hospital. [facility] IS NOT             RESPONSIBLE   FOR VALUABLES.  Contacts, glasses, dentures or bridgework may not be worn into surgery.   Bring small overnight bag day of surgery.   DO NOT BRING YOUR HOME MEDICATIONS TO THE HOSPITAL. PHARMACY WILL DISPENSE MEDICATIONS LISTED ON YOUR MEDICATION LIST TO YOU DURING YOUR ADMISSION IN THE HOSPITAL!   Special Instructions: Bring a copy of your  healthcare power of attorney and living will documents the day of surgery if you haven't scanned them before.              Please read over the following fact sheets you were given: IF YOU HAVE QUESTIONS ABOUT YOUR PRE-OP INSTRUCTIONS PLEASE CALL [phone] [facility]ras   If you received a COVID test during your pre-op visit  it is requested that you wear a mask when out in public, stay away from anyone that may not be feeling well and notify your surgeon if you develop symptoms. If you test positive for Covid or have been in contact with anyone that has tested positive in the last 10 days please notify you surgeon.      Pre-operative 5 CHG Bath Instructions   You can play a key role in reducing the risk of infection after surgery. Your skin needs to be as free of germs as possible. You can reduce the number of germs on your skin by washing with CHG (chlorhexidine gluconate) soap before surgery. CHG is an antiseptic soap that kills germs and continues to kill germs even after washing.   DO NOT use if you have an allergy to chlorhexidine/CHG or antibacterial soaps. If your skin becomes reddened or irritated, stop using the CHG and notify one of our RNs at [phone].   Please shower with the CHG soap starting 4 days before surgery using the following schedule:     Please keep in mind the following:  DO NOT shave, including legs and underarms, starting the day of your first shower.   You may shave your face at any point before/day of surgery.  Place clean sheets on your bed the day you start using CHG soap. Use a clean washcloth (not used since being washed) for each shower. DO NOT sleep with pets once you start using the CHG.   CHG Shower Instructions:  If you choose to wash your hair and private area, wash first with your normal shampoo/soap.  After you use shampoo/soap, rinse your hair and body thoroughly to remove shampoo/soap residue.  Turn the water OFF and apply about 3 tablespoons  (45 ml) of CHG soap to a CLEAN washcloth.  Apply CHG soap ONLY FROM YOUR NECK DOWN TO YOUR TOES (washing for 3-5 minutes)  DO NOT use CHG soap on face, private areas, open wounds, or sores.  Pay special attention to the area where your surgery is being performed.  If you are having back surgery, having someone wash your back for you may be helpful. Wait 2 minutes after CHG soap is applied, then you may rinse off the CHG soap.  Pat dry with a clean towel  Put on clean clothes/pajamas   If you choose to wear lotion, please use ONLY the CHG-compatible lotions on the back of this paper.     Additional instructions for the day of surgery: DO NOT APPLY any lotions, deodorants, cologne, or perfumes.   Put on clean/comfortable clothes.  Brush your teeth.  Ask your nurse before applying any prescription medications to the skin.      CHG Compatible Lotions   Aveeno Moisturizing lotion  Cetaphil  Moisturizing Cream  Cetaphil Moisturizing Lotion  Clairol Herbal Essence Moisturizing Lotion, Dry Skin  Clairol Herbal Essence Moisturizing Lotion, Extra Dry Skin  Clairol Herbal Essence Moisturizing Lotion, Normal Skin  Curel Age Defying Therapeutic Moisturizing Lotion with Alpha Hydroxy  Curel Extreme Care Body Lotion  Curel Soothing Hands Moisturizing Hand Lotion  Curel Therapeutic Moisturizing Cream, Fragrance-Free  Curel Therapeutic Moisturizing Lotion, Fragrance-Free  Curel Therapeutic Moisturizing Lotion, Original Formula  Eucerin Daily Replenishing Lotion  Eucerin Dry Skin Therapy Plus Alpha Hydroxy Crme  Eucerin Dry Skin Therapy Plus Alpha Hydroxy Lotion  Eucerin Original Crme  Eucerin Original Lotion  Eucerin Plus Crme Eucerin Plus Lotion  Eucerin TriLipid Replenishing Lotion  Keri Anti-Bacterial Hand Lotion  Keri Deep Conditioning Original Lotion Dry Skin Formula Softly Scented  Keri Deep Conditioning Original Lotion, Fragrance Free Sensitive Skin Formula  Keri Lotion Fast  Absorbing Fragrance Free Sensitive Skin Formula  Keri Lotion Fast Absorbing Softly Scented Dry Skin Formula  Keri Original Lotion  Keri Skin Renewal Lotion Keri Silky Smooth Lotion  Keri Silky Smooth Sensitive Skin Lotion  Nivea Body Creamy Conditioning Oil  Nivea Body Extra Enriched Teacher, adult education Moisturizing Lotion Nivea Crme  Nivea Skin Firming Lotion  NutraDerm 30 Skin Lotion  NutraDerm Skin Lotion  NutraDerm Therapeutic Skin Cream  NutraDerm Therapeutic Skin Lotion  ProShield Protective Hand Cream  Provon moisturizing lotion  WHAT IS A BLOOD TRANSFUSION? Blood Transfusion Information  A transfusion is the replacement of blood or some of its parts. Blood is made up of multiple cells which provide different functions. Red blood cells carry oxygen and are used for blood loss replacement. White blood cells fight against infection. Platelets control bleeding. Plasma helps clot blood. Other blood products are available for specialized needs, such as hemophilia or other clotting disorders. BEFORE THE TRANSFUSION  Who gives blood for transfusions?  Healthy volunteers who are fully evaluated to make sure their blood is safe. This is blood bank blood. Transfusion therapy is the safest it has ever been in the practice of medicine. Before blood is taken from a donor, a complete history is taken to make sure that person has no history of diseases nor engages in risky social behavior (examples are intravenous drug use or sexual activity with multiple partners). The donor's travel history is screened to minimize risk of transmitting infections, such as malaria. The donated blood is tested for signs of infectious diseases, such as HIV and hepatitis. The blood is then tested to be sure it is compatible with you in order to minimize the chance of a transfusion reaction. If you or a relative donates blood, this is often done in anticipation of surgery and is not  appropriate for emergency situations. It takes many days to process the donated blood. RISKS AND COMPLICATIONS Although transfusion therapy is very safe and saves many lives, the main dangers of transfusion include:  Getting an infectious disease. Developing a transfusion reaction. This is an allergic reaction to something in the blood you were given. Every precaution is taken to prevent this. The decision to have a blood transfusion has been considered carefully by your caregiver before blood is given. Blood is not given unless the benefits outweigh the risks. AFTER THE TRANSFUSION Right after receiving a blood transfusion, you will usually feel much better and more energetic. This is especially true if your red blood cells have gotten low (anemic). The transfusion raises the level of the red blood cells which carry  oxygen, and this usually causes an energy increase. The nurse administering the transfusion will monitor you carefully for complications. HOME CARE INSTRUCTIONS  No special instructions are needed after a transfusion. You may find your energy is better. Speak with your caregiver about any limitations on activity for underlying diseases you may have. SEEK MEDICAL CARE IF:  Your condition is not improving after your transfusion. You develop redness or irritation at the intravenous (IV) site. SEEK IMMEDIATE MEDICAL CARE IF:  Any of the following symptoms occur over the next 12 hours: Shaking chills. You have a temperature by mouth above 102 F (38.9 C), not controlled by medicine. Chest, back, or muscle pain. People around you feel you are not acting correctly or are confused. Shortness of breath or difficulty breathing. Dizziness and fainting. You get a rash or develop hives. You have a decrease in urine output. Your urine turns a dark color or changes to pink, red, or brown. Any of the following symptoms occur over the next 10 days: You have a temperature by mouth above 102 F  (38.9 C), not controlled by medicine. Shortness of breath. Weakness after normal activity. The white part of the eye turns yellow (jaundice). You have a decrease in the amount of urine or are urinating less often. Your urine turns a dark color or changes to pink, red, or brown. Document Released: 10/30/2000 Document Revised: 01/25/2012 Document Reviewed: 06/18/2008 [employer] Patient Information 2014 [place], [place].  _______________________________________________________________________  Incentive Spirometer  An incentive spirometer is a tool that can help keep your lungs clear and active. This tool measures how well you are filling your lungs with each breath. Taking long deep breaths may help reverse or decrease the chance of developing breathing (pulmonary) problems (especially infection) following: A long period of time when you are unable to move or be active. BEFORE THE PROCEDURE  If the spirometer includes an indicator to show your best effort, your nurse or respiratory therapist will set it to a desired goal. If possible, sit up straight or lean slightly forward. Try not to slouch. Hold the incentive spirometer in an upright position. INSTRUCTIONS FOR USE  Sit on the edge of your bed if possible, or sit up as far as you can in bed or on a chair. Hold the incentive spirometer in an upright position. Breathe out normally. Place the mouthpiece in your mouth and seal your lips tightly around it. Breathe in slowly and as deeply as possible, raising the piston or the ball toward the top of the column. Hold your breath for 3-5 seconds or for as long as possible. Allow the piston or ball to fall to the bottom of the column. Remove the mouthpiece from your mouth and breathe out normally. Rest for a few seconds and repeat Steps 1 through 7 at least 10 times every 1-2 hours when you are awake. Take your time and take a few normal breaths between deep breaths. The spirometer may include an  indicator to show your best effort. Use the indicator as a goal to work toward during each repetition. After each set of 10 deep breaths, practice coughing to be sure your lungs are clear. If you have an incision (the cut made at the time of surgery), support your incision when coughing by placing a pillow or rolled up towels firmly against it. Once you are able to get out of bed, walk around indoors and cough well. You may stop using the incentive spirometer when instructed by your caregiver.  RISKS AND COMPLICATIONS Take your time so you do not get dizzy or light-headed. If you are in pain, you may need to take or ask for pain medication before doing incentive spirometry. It is harder to take a deep breath if you are having pain. AFTER USE Rest and breathe slowly and easily. It can be helpful to keep track of a log of your progress. Your caregiver can provide you with a simple table to help with this. If you are using the spirometer at home, follow these instructions: SEEK MEDICAL CARE IF:  You are having difficultly using the spirometer. You have trouble using the spirometer as often as instructed. Your pain medication is not giving enough relief while using the spirometer. You develop fever of 100.5 F (38.1 C) or higher. SEEK IMMEDIATE MEDICAL CARE IF:  You cough up bloody sputum that had not been present before. You develop fever of 102 F (38.9 C) or greater. You develop worsening pain at or near the incision site. MAKE SURE YOU:  Understand these instructions. Will watch your condition. Will get help right away if you are not doing well or get worse. Document Released: 03/15/2007 Document Revised: 01/25/2012 Document Reviewed: 05/16/2007 [facility] Patient Information 2014 [place], [place].   ________________________________________________________________________

## 2023-05-27 ENCOUNTER — Encounter (HOSPITAL_COMMUNITY): Payer: Self-pay

## 2023-05-27 NOTE — Anesthesia Preprocedure Evaluation (Addendum)
Anesthesia Evaluation  Patient identified by MRN, date of birth, ID band Patient awake    Reviewed: Allergy & Precautions, NPO status , Patient's Chart, lab work & pertinent test results, reviewed documented beta blocker date and time   Airway Mallampati: II  TM Distance: >3 FB Neck ROM: Full    Dental  (+) Dental Advisory Given, Edentulous Upper, Lower Dentures Lower dentures are cemented in place:   Pulmonary sleep apnea and Continuous Positive Airway Pressure Ventilation , COPD, former smoker   Pulmonary exam normal breath sounds clear to auscultation       Cardiovascular hypertension, Pt. on medications and Pt. on home beta blockers Normal cardiovascular exam+ dysrhythmias Atrial Fibrillation  Rhythm:Regular Rate:Normal     Neuro/Psych  PSYCHIATRIC DISORDERS  Depression    negative neurological ROS     GI/Hepatic Neg liver ROS,GERD  ,,  Endo/Other  diabetes, Type 2  Obesity   Renal/GU negative Renal ROS     Musculoskeletal  (+) Arthritis , Osteoarthritis,    Abdominal   Peds  Hematology  (+) Blood dyscrasia, anemia   Anesthesia Other Findings Day of surgery medications reviewed with the patient.  Reproductive/Obstetrics                             Anesthesia Physical Anesthesia Plan  ASA: 3  Anesthesia Plan: Spinal   Post-op Pain Management: Tylenol PO (pre-op)*   Induction: Intravenous  PONV Risk Score and Plan: 3 and Dexamethasone, Ondansetron and TIVA  Airway Management Planned: Natural Airway and Simple Face Mask  Additional Equipment:   Intra-op Plan:   Post-operative Plan:   Informed Consent: I have reviewed the patients History and Physical, chart, labs and discussed the procedure including the risks, benefits and alternatives for the proposed anesthesia with the patient or authorized representative who has indicated his/her understanding and acceptance.      Dental advisory given  Plan Discussed with: CRNA  Anesthesia Plan Comments: (See PAT note from 7/9 by K Gekas PA-C )        Anesthesia Quick Evaluation

## 2023-05-27 NOTE — Progress Notes (Signed)
Case: [id number] Date/Time: 06/03/23 0935   Procedure: TOTAL HIP ARTHROPLASTY ANTERIOR APPROACH (Right: Hip)   Anesthesia type: Spinal   Pre-op diagnosis: Right hip osteoarthritis   Location: [facility] ROOM 10 / [facility]   Surgeons: Durene Romans, MD       DISCUSSION: Veronica Hansen is a 77 yo female who presents to PAT prior to surgery above. PMH significant for former smoking (quit 2001), HTN, HLD, OSA (on CPAP), COPD, TAA measuring 4.6cm, CAD, A.fib s/p ablation (2019), DM, GERD, anemia, obesity s/p sleeve gastrectomy.  No prior anesthesia complications.  Patient is followed by Cardiology. Last seen 01/25/23. Doing well from cardiac perspective. TAA measuring 4.6cm noted and follow up CT Chest due in July (ordered). Cardiac risk assessment and clearance provided on 05/19/23 via televisit:   "Preoperative cardiovascular risk assessment. Right total hip arthroplasty by Dr. Charlann Boxer.  Chart reviewed as part of pre-operative protocol coverage. According to the RCRI, patient has a 0.4% risk of MACE. Patient reports activity equivalent to >4.0 METS (water aerobics for 2 hours 3 days a week).  Given past medical history and time since last visit, based on ACC/AHA guidelines, Magdaline Zollars would be at acceptable risk for the planned procedure without further cardiovascular testing."  Patient followed by her PCP for other chronic medical issues. Last seen on 03/23/23 and was stable. Medical clearance provided in chart that patient is low risk and optimized (paper copy in chart).  VS: BP 116/67   Pulse 62   Temp 36.5 C (Oral)   Resp 16   Ht 5' 5.5" (1.664 m)   Wt 108 kg   SpO2 96%   BMI 39.00 kg/m   PROVIDERS: Garlan Fillers, MD Cardiologist: Dietrich Pates, MD  LABS: Labs reviewed: Acceptable for surgery. (all labs ordered are listed, but only abnormal results are displayed)  Labs Reviewed  CBC - Abnormal; Notable for the following components:      Result Value   Hemoglobin 11.5 (*)    MCV  79.5 (*)    MCH 24.1 (*)    RDW 17.3 (*)    All other components within normal limits  SURGICAL PCR SCREEN  HEMOGLOBIN A1C  BASIC METABOLIC PANEL  GLUCOSE, CAPILLARY  TYPE AND SCREEN     IMAGES: n/a   EKG: n/a   CV:  NM PET CT Cardiac Perfusion 12/08/22:  IMPRESSION: 1. 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 Recommend semi-annual imaging followup by CTA or MRA and referral to cardiothoracic surgery if not already obtained. This recommendation follows 2010 ACCF/AHA/AATS/ACR/ASA/SCA/SCAI/SIR/STS/SVM Guidelines for the Diagnosis and Management of Patients With Thoracic Aortic Disease. Circulation. 2010; 121: H474-Q595 2. Hiatal hernia containing part of the stomach, partial gastrectomy. 3. Centrilobular emphysema. 4. Thoracic spondylosis with multilevel interbody bridging spurring. 5. Aortic atherosclerosis.   Aortic Atherosclerosis (ICD10-I70.0) and Emphysema (ICD10-J43.9).  Echo 08/10/2018:  Study Conclusions   - Left ventricle: The cavity size was mildly dilated. Systolic    function was normal. The estimated ejection fraction was in the    range of 60% to 65%. Wall motion was normal; there were no    regional wall motion abnormalities. The study was not technically    sufficient to allow evaluation of LV diastolic dysfunction due to    atrial flutter.  - Mitral valve: There was mild regurgitation.  - Left atrium: The atrium was mildly dilated.  - Atrial septum: There was increased thickness of the septum,    consistent with lipomatous hypertrophy.  - Tricuspid valve: There  was trivial regurgitation.  - Pulmonic valve: There was trivial regurgitation.   Past Medical History:  Diagnosis Date   A-fib (HCC)    A-fib (HCC) 07/2018   treated with ablation procedure in October 2019   Allergic rhinitis    Anemia    Arthritis    joints hurt   ASCVD (arteriosclerotic cardiovascular disease)    Atypical chest pain    cardiology consult showed an echocardiogram  with left ventricular hypertrophy normal left ventricular function.   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   COPD (chronic obstructive pulmonary disease) (HCC)    Depression    husband died of Covid 06/06/2020   Diabetes mellitus    type 2, no longer takes medication    Diabetes mellitus (HCC)    Dyspnea    GERD (gastroesophageal reflux disease)    History of blood transfusion    HTN (hypertension)    Hypercholesterolemia    Hyperlipidemia    Hypertension    Incontinence    Iron deficiency anemia    Lower esophageal sphincter, relaxation    Morbid obesity (HCC)    OSA (obstructive sleep apnea)    OSA on CPAP 03/07/2014   OSA on CPAP    Proteinuria 2001   Psoriasis    RLL pneumonia 06/2016   Runny nose    seasonal - poss allergies   Sinus problem    SOB (shortness of breath)    Tobacco use    Stopped in 06/07/2003    Past Surgical History:  Procedure Laterality Date   A-FLUTTER ABLATION N/A 08/19/2018   Procedure: A-FLUTTER ABLATION;  Surgeon: Marinus Maw, MD;  [facility];  Service: Cardiovascular;  Laterality: N/A;   ATRIAL FLUTTER ABLATION     BARIATRIC SURGERY  06-06-2012   dental implants  11/16/2009   LARYNX SURGERY     polyp removed   left breast bx was fibrocystic change     left cataract extraction and lens implantation  01/11/2019   ORIF ULNAR FRACTURE Right 02/15/2021   Procedure: OPEN REDUCTION INTERNAL FIXATION (ORIF) ULNAR FRACTURE and distal radius open reduction and internal fixation and repair;  Surgeon: Bradly Bienenstock, MD;  Location: [facility];  Service: Orthopedics;  Laterality: Right;  with IV sedation   Right TKR  June 06, 2006   TOTAL KNEE ARTHROPLASTY  11/17/2003   left   TOTAL KNEE ARTHROPLASTY  11/16/2005   right    MEDICATIONS:  amLODipine (NORVASC) 5 MG tablet   Calcium-Vitamin D-Vitamin K (VIACTIV CALCIUM PLUS D) 650-12.5-40 MG-MCG-MCG CHEW   escitalopram (LEXAPRO) 10 MG tablet   ezetimibe (ZETIA) 10 MG tablet   ferrous sulfate 325 (65 FE) MG  tablet   glucose blood (ONETOUCH VERIO) test strip   metoprolol succinate (TOPROL-XL) 100 MG 24 hr tablet   Multiple Vitamins-Minerals (CENTRUM ADULTS PO)   olmesartan (BENICAR) 40 MG tablet   OneTouch Delica Lancets 33G MISC   Polyethyl Glycol-Propyl Glycol (SYSTANE OP)   simvastatin (ZOCOR) 40 MG tablet   TRULICITY 4.5 MG/0.5ML SOPN   No current facility-administered medications for this encounter.   Marcille Blanco [facility] Short Stay/Anesthesiology [facility] Phone [phone] 05/27/2023 1:25 PM

## 2023-06-02 NOTE — H&P (Signed)
TOTAL HIP ADMISSION H&P  Patient is admitted for right total hip arthroplasty.  Therapy Plans: HEP Disposition: Lives alone (daughter will be there day of surgery) Planned DVT Prophylaxis: aspirin 81mg  BID DME needed: none PCP: Dr. Jarold Motto, clearance received Cardio: clearance received, (hx of a flutter - s/p ablation) TXA: IV Allergies: NKDA Anesthesia Concerns: none BMI: 39.2 Last HgbA1c: 5.2%   Other: - oxycodone (10 pills), robaxin, tylenol, meloxicam - No hx of VTE or cancer - abx x 7 days - hx of anemia - last hgb >12   Subjective:  Chief Complaint: right hip pain  HPI: Veronica Hansen, 77 y.o. female, has a history of pain and functional disability in the right hip(s) due to arthritis and patient has failed non-surgical conservative treatments for greater than 12 weeks to include NSAID's and/or analgesics and activity modification.  Onset of symptoms was gradual starting 2 years ago with gradually worsening course since that time.The patient noted no past surgery on the right hip(s).  Patient currently rates pain in the right hip at 8 out of 10 with activity. Patient has worsening of pain with activity and weight bearing, pain that interfers with activities of daily living, and pain with passive range of motion. Patient has evidence of joint space narrowing by imaging studies. This condition presents safety issues increasing the risk of falls.  There is no current active infection.  Patient Active Problem List   Diagnosis Date Noted   Atrial flutter (HCC) 08/19/2018   OSA on CPAP 03/07/2014   Past Medical History:  Diagnosis Date   A-fib (HCC)    A-fib (HCC) 07/2018   treated with ablation procedure in October 2019   Allergic rhinitis    Anemia    Arthritis    joints hurt   ASCVD (arteriosclerotic cardiovascular disease)    Atypical chest pain    cardiology consult showed an echocardiogram with left ventricular hypertrophy normal left ventricular function.    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   COPD (chronic obstructive pulmonary disease) (HCC)    Depression    husband died of Covid 06/05/20   Diabetes mellitus    type 2, no longer takes medication    Diabetes mellitus (HCC)    Dyspnea    GERD (gastroesophageal reflux disease)    History of blood transfusion    HTN (hypertension)    Hypercholesterolemia    Hyperlipidemia    Hypertension    Incontinence    Iron deficiency anemia    Lower esophageal sphincter, relaxation    Morbid obesity (HCC)    OSA (obstructive sleep apnea)    OSA on CPAP 03/07/2014   OSA on CPAP    Proteinuria 2001   Psoriasis    RLL pneumonia 06/2016   Runny nose    seasonal - poss allergies   Sinus problem    SOB (shortness of breath)    Tobacco use    Stopped in 06-06-2003    Past Surgical History:  Procedure Laterality Date   A-FLUTTER ABLATION N/A 08/19/2018   Procedure: A-FLUTTER ABLATION;  Surgeon: Marinus Maw, MD;  [facility];  Service: Cardiovascular;  Laterality: N/A;   ATRIAL FLUTTER ABLATION     BARIATRIC SURGERY  05-Jun-2012   dental implants  11/16/2009   LARYNX SURGERY     polyp removed   left breast bx was fibrocystic change     left cataract extraction and lens implantation  01/11/2019   ORIF ULNAR FRACTURE  Right 02/15/2021   Procedure: OPEN REDUCTION INTERNAL FIXATION (ORIF) ULNAR FRACTURE and distal radius open reduction and internal fixation and repair;  Surgeon: Bradly Bienenstock, MD;  Location[facility];  Service: Orthopedics;  Laterality: Right;  with IV sedation   Right TKR  2007   TOTAL KNEE ARTHROPLASTY  11/17/2003   left   TOTAL KNEE ARTHROPLASTY  11/16/2005   right    No current facility-administered medications for this encounter.   Current Outpatient Medications  Medication Sig Dispense Refill Last Dose   amLODipine (NORVASC) 5 MG tablet Take 1 tablet (5 mg total) by mouth daily. 90 tablet 3    Calcium-Vitamin D-Vitamin K (VIACTIV CALCIUM PLUS D) 650-12.5-40  MG-MCG-MCG CHEW Chew 1 tablet by mouth daily.      escitalopram (LEXAPRO) 10 MG tablet Take 10 mg by mouth daily.      ezetimibe (ZETIA) 10 MG tablet Take 10 mg by mouth daily.      ferrous sulfate 325 (65 FE) MG tablet Take 325 mg by mouth every other day.      metoprolol succinate (TOPROL-XL) 100 MG 24 hr tablet Take 100 mg by mouth daily.      Multiple Vitamins-Minerals (CENTRUM ADULTS PO) Take 1 tablet by mouth daily.      olmesartan (BENICAR) 40 MG tablet Take 40 mg by mouth daily.      Polyethyl Glycol-Propyl Glycol (SYSTANE OP) Place 1 drop into both eyes as needed (dry eyes).      simvastatin (ZOCOR) 40 MG tablet Take 40 mg by mouth daily.      TRULICITY 4.5 MG/0.5ML SOPN Inject 4.5 mg into the skin every 7 (seven) days.      glucose blood (ONETOUCH VERIO) test strip 1 each by Other route in the morning, at noon, in the evening, and at bedtime. Use as instructed      OneTouch Delica Lancets 33G MISC by Does not apply route as directed.      Allergies  Allergen Reactions   Orlistat Diarrhea    Other reaction(s): severe diarrhea   Phentermine Hcl     Other reaction(s): elevated BP    Social History   Tobacco Use   Smoking status: Former    Current packs/day: 0.00    Types: Cigarettes    Start date: 08/19/1980    Quit date: 08/19/2000    Years since quitting: 22.8   Smokeless tobacco: Never  Substance Use Topics   Alcohol use: No    Comment: Rare    Family History  Problem Relation Age of Onset   Diabetes Father    Diabetes Brother    Colon cancer Neg Hx    Rectal cancer Neg Hx    Stomach cancer Neg Hx      Review of Systems  Constitutional:  Negative for chills and fever.  Respiratory:  Negative for cough and shortness of breath.   Cardiovascular:  Negative for chest pain.  Gastrointestinal:  Negative for nausea and vomiting.  Musculoskeletal:  Positive for arthralgias.     Objective:  Physical Exam Well nourished and well developed. General: Alert and  oriented x3, cooperative and pleasant, no acute distress. Head: normocephalic, atraumatic, neck supple. Eyes: EOMI.  Musculoskeletal: Right hip exam: She does walk with an antalgic Trendelenburg gait favoring her right hip. She uses a cane for gait assistance due to her pain. Range of motion of the right hip is painful and limited and thus not stressed today Slight external rotation contracture with weakness with  active hip flexion Neurovascular intact distally   Calves soft and nontender. Motor function intact in LE. Strength 5/5 LE bilaterally. Neuro: Distal pulses 2+. Sensation to light touch intact in LE.  Vital signs in last 24 hours:    Labs:   Estimated body mass index is 39 kg/m as calculated from the following:   Height as of 05/25/23: 5' 5.5" (1.664 m).   Weight as of 05/25/23: 108 kg.   Imaging Review Plain radiographs demonstrate severe degenerative joint disease of the right hip(s). The bone quality appears to be adequate for age and reported activity level.      Assessment/Plan:  End stage arthritis, right hip(s)  The patient history, physical examination, clinical judgement of the provider and imaging studies are consistent with end stage degenerative joint disease of the right hip(s) and total hip arthroplasty is deemed medically necessary. The treatment options including medical management, injection therapy, arthroscopy and arthroplasty were discussed at length. The risks and benefits of total hip arthroplasty were presented and reviewed. The risks due to aseptic loosening, infection, stiffness, dislocation/subluxation,  thromboembolic complications and other imponderables were discussed.  The patient acknowledged the explanation, agreed to proceed with the plan and consent was signed. Patient is being admitted for inpatient treatment for surgery, pain control, PT, OT, prophylactic antibiotics, VTE prophylaxis, progressive ambulation and ADL's and discharge  planning.The patient is planning to be discharged  home.   Rosalene Billings, PA-C Orthopedic Surgery [facility] [place] [phone]

## 2023-06-03 ENCOUNTER — Other Ambulatory Visit: Payer: Self-pay

## 2023-06-03 ENCOUNTER — Encounter (HOSPITAL_COMMUNITY)
Admission: RE | Disposition: A | Discharge status: Home / Self Care | Payer: Self-pay | Source: Home / Self Care | Attending: Orthopedic Surgery

## 2023-06-03 ENCOUNTER — Ambulatory Visit (HOSPITAL_BASED_OUTPATIENT_CLINIC_OR_DEPARTMENT_OTHER): Payer: Medicare Other | Admitting: Anesthesiology

## 2023-06-03 ENCOUNTER — Observation Stay (HOSPITAL_COMMUNITY)
Admission: RE | Admit: 2023-06-03 | Discharge: 2023-06-04 | Disposition: A | Discharge status: Home / Self Care | Payer: Medicare Other | Attending: Orthopedic Surgery | Admitting: Orthopedic Surgery

## 2023-06-03 ENCOUNTER — Ambulatory Visit (HOSPITAL_COMMUNITY): Payer: Medicare Other | Admitting: Medical

## 2023-06-03 ENCOUNTER — Ambulatory Visit (HOSPITAL_COMMUNITY): Payer: Medicare Other

## 2023-06-03 ENCOUNTER — Encounter (HOSPITAL_COMMUNITY): Payer: Self-pay | Admitting: Orthopedic Surgery

## 2023-06-03 ENCOUNTER — Observation Stay (HOSPITAL_COMMUNITY): Payer: Medicare Other

## 2023-06-03 DIAGNOSIS — Z87891 Personal history of nicotine dependence: Secondary | ICD-10-CM

## 2023-06-03 DIAGNOSIS — Z85828 Personal history of other malignant neoplasm of skin: Secondary | ICD-10-CM | POA: Insufficient documentation

## 2023-06-03 DIAGNOSIS — Z96653 Presence of artificial knee joint, bilateral: Secondary | ICD-10-CM | POA: Insufficient documentation

## 2023-06-03 DIAGNOSIS — M1611 Unilateral primary osteoarthritis, right hip: Principal | ICD-10-CM | POA: Insufficient documentation

## 2023-06-03 DIAGNOSIS — J449 Chronic obstructive pulmonary disease, unspecified: Secondary | ICD-10-CM | POA: Diagnosis not present

## 2023-06-03 DIAGNOSIS — E119 Type 2 diabetes mellitus without complications: Secondary | ICD-10-CM | POA: Insufficient documentation

## 2023-06-03 DIAGNOSIS — I10 Essential (primary) hypertension: Secondary | ICD-10-CM | POA: Insufficient documentation

## 2023-06-03 DIAGNOSIS — I4891 Unspecified atrial fibrillation: Secondary | ICD-10-CM

## 2023-06-03 DIAGNOSIS — Z79899 Other long term (current) drug therapy: Secondary | ICD-10-CM | POA: Insufficient documentation

## 2023-06-03 DIAGNOSIS — Z7985 Long-term (current) use of injectable non-insulin antidiabetic drugs: Secondary | ICD-10-CM | POA: Diagnosis not present

## 2023-06-03 DIAGNOSIS — Z96641 Presence of right artificial hip joint: Secondary | ICD-10-CM

## 2023-06-03 HISTORY — PX: TOTAL HIP ARTHROPLASTY: SHX124

## 2023-06-03 LAB — TYPE AND SCREEN
ABO/RH(D): O POS
Antibody Screen: NEGATIVE

## 2023-06-03 LAB — GLUCOSE, CAPILLARY
Glucose-Capillary: 91 mg/dL (ref 70–99)
Glucose-Capillary: 105 mg/dL — ABNORMAL HIGH (ref 70–99)
Glucose-Capillary: 207 mg/dL — ABNORMAL HIGH (ref 70–99)
Glucose-Capillary: 209 mg/dL — ABNORMAL HIGH (ref 70–99)

## 2023-06-03 SURGERY — ARTHROPLASTY, HIP, TOTAL, ANTERIOR APPROACH
Anesthesia: General | Site: Hip | Laterality: Right

## 2023-06-03 MED ORDER — POVIDONE-IODINE 10 % EX SWAB: 2.0000 | Freq: Once | CUTANEOUS | Status: DC

## 2023-06-03 MED ORDER — SODIUM CHLORIDE 0.9 % IV SOLN: INTRAVENOUS | Status: DC

## 2023-06-03 MED ORDER — EPHEDRINE 5 MG/ML INJ
INTRAVENOUS | Status: AC
  Filled 2023-06-03: qty 5

## 2023-06-03 MED ORDER — ONDANSETRON HCL 4 MG PO TABS: 4.0000 mg | ORAL_TABLET | Freq: Four times a day (QID) | ORAL | Status: DC | PRN

## 2023-06-03 MED ORDER — CEFAZOLIN SODIUM-DEXTROSE 2-4 GM/100ML-% IV SOLN
2.0000 g | Freq: Four times a day (QID) | INTRAVENOUS | Status: AC
  Administered 2023-06-03 (×2): 2 g via INTRAVENOUS
  Filled 2023-06-03 (×2): qty 100

## 2023-06-03 MED ORDER — ROCURONIUM BROMIDE 10 MG/ML (PF) SYRINGE
PREFILLED_SYRINGE | INTRAVENOUS | Status: AC
  Filled 2023-06-03: qty 10

## 2023-06-03 MED ORDER — IRBESARTAN 150 MG PO TABS
300.0000 mg | ORAL_TABLET | Freq: Every day | ORAL | Status: DC
  Administered 2023-06-04: 300 mg via ORAL
  Filled 2023-06-03: qty 2

## 2023-06-03 MED ORDER — ONDANSETRON HCL 4 MG/2ML IJ SOLN
INTRAMUSCULAR | Status: AC
  Filled 2023-06-03: qty 2

## 2023-06-03 MED ORDER — STERILE WATER FOR IRRIGATION IR SOLN
Status: DC | PRN
  Administered 2023-06-03: 2000 mL

## 2023-06-03 MED ORDER — FENTANYL CITRATE (PF) 100 MCG/2ML IJ SOLN
INTRAMUSCULAR | Status: AC
  Filled 2023-06-03: qty 2

## 2023-06-03 MED ORDER — KETOROLAC TROMETHAMINE 30 MG/ML IJ SOLN
INTRAMUSCULAR | Status: AC
  Filled 2023-06-03: qty 1

## 2023-06-03 MED ORDER — OXYCODONE HCL 5 MG PO TABS
5.0000 mg | ORAL_TABLET | ORAL | Status: DC | PRN
  Filled 2023-06-03: qty 2

## 2023-06-03 MED ORDER — DEXAMETHASONE SODIUM PHOSPHATE 10 MG/ML IJ SOLN
8.0000 mg | Freq: Once | INTRAMUSCULAR | Status: AC
  Administered 2023-06-03: 8 mg via INTRAVENOUS

## 2023-06-03 MED ORDER — FENTANYL CITRATE PF 50 MCG/ML IJ SOSY
25.0000 ug | PREFILLED_SYRINGE | INTRAMUSCULAR | Status: DC | PRN
  Administered 2023-06-03: 25 ug via INTRAVENOUS

## 2023-06-03 MED ORDER — ONDANSETRON HCL 4 MG/2ML IJ SOLN: 4.0000 mg | Freq: Once | INTRAMUSCULAR | Status: DC | PRN

## 2023-06-03 MED ORDER — HYDROMORPHONE HCL 1 MG/ML IJ SOLN: 0.5000 mg | INTRAMUSCULAR | Status: DC | PRN

## 2023-06-03 MED ORDER — ACETAMINOPHEN 500 MG PO TABS
1000.0000 mg | ORAL_TABLET | Freq: Four times a day (QID) | ORAL | Status: DC
  Administered 2023-06-03 – 2023-06-04 (×3): 1000 mg via ORAL
  Filled 2023-06-03 (×4): qty 2

## 2023-06-03 MED ORDER — LACTATED RINGERS IV SOLN: INTRAVENOUS | Status: DC

## 2023-06-03 MED ORDER — INSULIN ASPART 100 UNIT/ML IJ SOLN
0.0000 [IU] | Freq: Three times a day (TID) | INTRAMUSCULAR | Status: DC
  Administered 2023-06-03: 5 [IU] via SUBCUTANEOUS

## 2023-06-03 MED ORDER — SUGAMMADEX SODIUM 200 MG/2ML IV SOLN
INTRAVENOUS | Status: DC | PRN
  Administered 2023-06-03 (×2): 100 mg via INTRAVENOUS

## 2023-06-03 MED ORDER — CEFAZOLIN SODIUM-DEXTROSE 2-4 GM/100ML-% IV SOLN
2.0000 g | INTRAVENOUS | Status: AC
  Administered 2023-06-03: 2 g via INTRAVENOUS
  Filled 2023-06-03: qty 100

## 2023-06-03 MED ORDER — SIMVASTATIN 40 MG PO TABS: 40.0000 mg | ORAL_TABLET | Freq: Every day | ORAL | Status: DC

## 2023-06-03 MED ORDER — 0.9 % SODIUM CHLORIDE (POUR BTL) OPTIME
TOPICAL | Status: DC | PRN
  Administered 2023-06-03: 1000 mL

## 2023-06-03 MED ORDER — DIPHENHYDRAMINE HCL 12.5 MG/5ML PO ELIX: 12.5000 mg | ORAL_SOLUTION | ORAL | Status: DC | PRN

## 2023-06-03 MED ORDER — METHOCARBAMOL 500 MG IVPB - SIMPLE MED
INTRAVENOUS | Status: AC
  Filled 2023-06-03: qty 55

## 2023-06-03 MED ORDER — TRANEXAMIC ACID-NACL 1000-0.7 MG/100ML-% IV SOLN
1000.0000 mg | Freq: Once | INTRAVENOUS | Status: AC
  Administered 2023-06-03: 1000 mg via INTRAVENOUS
  Filled 2023-06-03: qty 100

## 2023-06-03 MED ORDER — EPHEDRINE SULFATE-NACL 50-0.9 MG/10ML-% IV SOSY
PREFILLED_SYRINGE | INTRAVENOUS | Status: DC | PRN
  Administered 2023-06-03 (×3): 2.5 mg via INTRAVENOUS

## 2023-06-03 MED ORDER — METHOCARBAMOL 500 MG PO TABS: 500.0000 mg | ORAL_TABLET | Freq: Four times a day (QID) | ORAL | Status: DC | PRN

## 2023-06-03 MED ORDER — BUPIVACAINE-EPINEPHRINE 0.25% -1:200000 IJ SOLN
INTRAMUSCULAR | Status: AC
  Filled 2023-06-03: qty 1

## 2023-06-03 MED ORDER — PROPOFOL 10 MG/ML IV BOLUS
INTRAVENOUS | Status: DC | PRN
  Administered 2023-06-03: 80 mg via INTRAVENOUS
  Administered 2023-06-03: 20 mg via INTRAVENOUS

## 2023-06-03 MED ORDER — TRANEXAMIC ACID-NACL 1000-0.7 MG/100ML-% IV SOLN
1000.0000 mg | INTRAVENOUS | Status: AC
  Administered 2023-06-03: 1000 mg via INTRAVENOUS
  Filled 2023-06-03: qty 100

## 2023-06-03 MED ORDER — MENTHOL 3 MG MT LOZG: 1.0000 | LOZENGE | OROMUCOSAL | Status: DC | PRN

## 2023-06-03 MED ORDER — PROPOFOL 500 MG/50ML IV EMUL
INTRAVENOUS | Status: AC
  Filled 2023-06-03: qty 50

## 2023-06-03 MED ORDER — PROPOFOL 500 MG/50ML IV EMUL
INTRAVENOUS | Status: DC | PRN
  Administered 2023-06-03: 100 ug/kg/min via INTRAVENOUS

## 2023-06-03 MED ORDER — FENTANYL CITRATE PF 50 MCG/ML IJ SOSY
PREFILLED_SYRINGE | INTRAMUSCULAR | Status: AC
  Administered 2023-06-03: 25 ug via INTRAVENOUS
  Filled 2023-06-03: qty 2

## 2023-06-03 MED ORDER — ONDANSETRON HCL 4 MG/2ML IJ SOLN
INTRAMUSCULAR | Status: DC | PRN
  Administered 2023-06-03: 4 mg via INTRAVENOUS

## 2023-06-03 MED ORDER — FERROUS SULFATE 325 (65 FE) MG PO TABS
325.0000 mg | ORAL_TABLET | Freq: Every day | ORAL | Status: DC
  Administered 2023-06-04: 325 mg via ORAL
  Filled 2023-06-03: qty 1

## 2023-06-03 MED ORDER — PROPOFOL 1000 MG/100ML IV EMUL
INTRAVENOUS | Status: AC
  Filled 2023-06-03: qty 100

## 2023-06-03 MED ORDER — OXYCODONE HCL 5 MG PO TABS
10.0000 mg | ORAL_TABLET | ORAL | Status: DC | PRN
  Administered 2023-06-03: 10 mg via ORAL
  Filled 2023-06-03: qty 2

## 2023-06-03 MED ORDER — SODIUM CHLORIDE (PF) 0.9 % IJ SOLN
INTRAMUSCULAR | Status: DC | PRN
  Administered 2023-06-03: 30 mL

## 2023-06-03 MED ORDER — LIDOCAINE HCL (PF) 2 % IJ SOLN
INTRAMUSCULAR | Status: AC
  Filled 2023-06-03: qty 5

## 2023-06-03 MED ORDER — EZETIMIBE 10 MG PO TABS
10.0000 mg | ORAL_TABLET | Freq: Every day | ORAL | Status: DC
  Administered 2023-06-04: 10 mg via ORAL
  Filled 2023-06-03: qty 1

## 2023-06-03 MED ORDER — DEXAMETHASONE SODIUM PHOSPHATE 10 MG/ML IJ SOLN
10.0000 mg | Freq: Once | INTRAMUSCULAR | Status: AC
  Administered 2023-06-04: 10 mg via INTRAVENOUS
  Filled 2023-06-03: qty 1

## 2023-06-03 MED ORDER — LIDOCAINE 2% (20 MG/ML) 5 ML SYRINGE
INTRAMUSCULAR | Status: DC | PRN
  Administered 2023-06-03: 80 mg via INTRAVENOUS

## 2023-06-03 MED ORDER — BUPIVACAINE-EPINEPHRINE (PF) 0.25% -1:200000 IJ SOLN
INTRAMUSCULAR | Status: DC | PRN
  Administered 2023-06-03: 30 mL

## 2023-06-03 MED ORDER — ORAL CARE MOUTH RINSE: 15.0000 mL | Freq: Once | OROMUCOSAL | Status: AC

## 2023-06-03 MED ORDER — KETOROLAC TROMETHAMINE 30 MG/ML IJ SOLN
INTRAMUSCULAR | Status: DC | PRN
  Administered 2023-06-03: 30 mg

## 2023-06-03 MED ORDER — METOPROLOL SUCCINATE ER 50 MG PO TB24
100.0000 mg | ORAL_TABLET | Freq: Every day | ORAL | Status: DC
  Administered 2023-06-04: 100 mg via ORAL
  Filled 2023-06-03: qty 2

## 2023-06-03 MED ORDER — POLYETHYLENE GLYCOL 3350 17 G PO PACK
17.0000 g | PACK | Freq: Two times a day (BID) | ORAL | Status: DC
  Administered 2023-06-04: 17 g via ORAL
  Filled 2023-06-03 (×2): qty 1

## 2023-06-03 MED ORDER — ROCURONIUM BROMIDE 10 MG/ML (PF) SYRINGE
PREFILLED_SYRINGE | INTRAVENOUS | Status: DC | PRN
  Administered 2023-06-03: 60 mg via INTRAVENOUS

## 2023-06-03 MED ORDER — ESCITALOPRAM OXALATE 10 MG PO TABS
10.0000 mg | ORAL_TABLET | Freq: Every day | ORAL | Status: DC
  Administered 2023-06-04: 10 mg via ORAL
  Filled 2023-06-03: qty 1

## 2023-06-03 MED ORDER — DOCUSATE SODIUM 100 MG PO CAPS
100.0000 mg | ORAL_CAPSULE | Freq: Two times a day (BID) | ORAL | Status: DC
  Administered 2023-06-03 – 2023-06-04 (×3): 100 mg via ORAL
  Filled 2023-06-03 (×3): qty 1

## 2023-06-03 MED ORDER — DEXAMETHASONE SODIUM PHOSPHATE 10 MG/ML IJ SOLN
INTRAMUSCULAR | Status: AC
  Filled 2023-06-03: qty 1

## 2023-06-03 MED ORDER — PHENYLEPHRINE HCL-NACL 20-0.9 MG/250ML-% IV SOLN
INTRAVENOUS | Status: DC | PRN
  Administered 2023-06-03: 25 ug/min via INTRAVENOUS

## 2023-06-03 MED ORDER — ACETAMINOPHEN 500 MG PO TABS
1000.0000 mg | ORAL_TABLET | Freq: Once | ORAL | Status: AC
  Administered 2023-06-03: 1000 mg via ORAL
  Filled 2023-06-03: qty 2

## 2023-06-03 MED ORDER — ASPIRIN 81 MG PO CHEW
81.0000 mg | CHEWABLE_TABLET | Freq: Two times a day (BID) | ORAL | Status: DC
  Administered 2023-06-03 – 2023-06-04 (×2): 81 mg via ORAL
  Filled 2023-06-03 (×2): qty 1

## 2023-06-03 MED ORDER — SODIUM CHLORIDE (PF) 0.9 % IJ SOLN
INTRAMUSCULAR | Status: AC
  Filled 2023-06-03: qty 50

## 2023-06-03 MED ORDER — CHLORHEXIDINE GLUCONATE 0.12 % MT SOLN
15.0000 mL | Freq: Once | OROMUCOSAL | Status: AC
  Administered 2023-06-03: 15 mL via OROMUCOSAL

## 2023-06-03 MED ORDER — METOCLOPRAMIDE HCL 5 MG/ML IJ SOLN: 5.0000 mg | Freq: Three times a day (TID) | INTRAMUSCULAR | Status: DC | PRN

## 2023-06-03 MED ORDER — METOCLOPRAMIDE HCL 5 MG PO TABS: 5.0000 mg | ORAL_TABLET | Freq: Three times a day (TID) | ORAL | Status: DC | PRN

## 2023-06-03 MED ORDER — BISACODYL 10 MG RE SUPP: 10.0000 mg | Freq: Every day | RECTAL | Status: DC | PRN

## 2023-06-03 MED ORDER — METHOCARBAMOL 500 MG IVPB - SIMPLE MED
500.0000 mg | Freq: Four times a day (QID) | INTRAVENOUS | Status: DC | PRN
  Administered 2023-06-03: 500 mg via INTRAVENOUS

## 2023-06-03 MED ORDER — ONDANSETRON HCL 4 MG/2ML IJ SOLN: 4.0000 mg | Freq: Four times a day (QID) | INTRAMUSCULAR | Status: DC | PRN

## 2023-06-03 MED ORDER — PHENOL 1.4 % MT LIQD: 1.0000 | OROMUCOSAL | Status: DC | PRN

## 2023-06-03 MED ORDER — AMLODIPINE BESYLATE 5 MG PO TABS
5.0000 mg | ORAL_TABLET | Freq: Every day | ORAL | Status: DC
  Administered 2023-06-04: 5 mg via ORAL
  Filled 2023-06-03: qty 1

## 2023-06-03 MED ORDER — FENTANYL CITRATE (PF) 100 MCG/2ML IJ SOLN
INTRAMUSCULAR | Status: DC | PRN
  Administered 2023-06-03 (×2): 50 ug via INTRAVENOUS
  Administered 2023-06-03 (×2): 25 ug via INTRAVENOUS
  Administered 2023-06-03: 50 ug via INTRAVENOUS

## 2023-06-03 SURGICAL SUPPLY — 41 items
ADH SKN CLS APL DERMABOND .7 (GAUZE/BANDAGES/DRESSINGS) ×1
BAG COUNTER SPONGE SURGICOUNT (BAG) IMPLANT
BAG SPEC THK2 15X12 ZIP CLS (MISCELLANEOUS)
BAG SPNG CNTER NS LX DISP (BAG)
BAG ZIPLOCK 12X15 (MISCELLANEOUS) IMPLANT
BLADE SAG 18X100X1.27 (BLADE) ×1 IMPLANT
COVER PERINEAL POST (MISCELLANEOUS) ×1 IMPLANT
COVER SURGICAL LIGHT HANDLE (MISCELLANEOUS) ×1 IMPLANT
CUP ACETBLR 54 OD PINNACLE (Hips) IMPLANT
DERMABOND ADVANCED .7 DNX12 (GAUZE/BANDAGES/DRESSINGS) ×1 IMPLANT
DRAPE FOOT SWITCH (DRAPES) ×1 IMPLANT
DRAPE STERI IOBAN 125X83 (DRAPES) ×1 IMPLANT
DRAPE U-SHAPE 47X51 STRL (DRAPES) ×2 IMPLANT
DRESSING AQUACEL AG SP 3.5X10 (GAUZE/BANDAGES/DRESSINGS) ×1 IMPLANT
DRSG AQUACEL AG SP 3.5X10 (GAUZE/BANDAGES/DRESSINGS) ×1
DURAPREP 26ML APPLICATOR (WOUND CARE) ×1 IMPLANT
ELECT REM PT RETURN 15FT ADLT (MISCELLANEOUS) ×1 IMPLANT
GLOVE BIO SURGEON STRL SZ 6 (GLOVE) ×1 IMPLANT
GLOVE BIOGEL PI IND STRL 6.5 (GLOVE) ×1 IMPLANT
GLOVE BIOGEL PI IND STRL 7.5 (GLOVE) ×1 IMPLANT
GLOVE ORTHO TXT STRL SZ7.5 (GLOVE) ×2 IMPLANT
GOWN STRL REUS W/ TWL LRG LVL3 (GOWN DISPOSABLE) ×2 IMPLANT
GOWN STRL REUS W/TWL LRG LVL3 (GOWN DISPOSABLE) ×2
HEAD CERAMIC DELTA 36 PLUS 1.5 (Hips) IMPLANT
HOLDER FOLEY CATH W/STRAP (MISCELLANEOUS) ×1 IMPLANT
KIT TURNOVER KIT A (KITS) IMPLANT
LINER NEUTRAL 54X36MM PLUS 4 (Hips) IMPLANT
NDL SAFETY ECLIP 18X1.5 (MISCELLANEOUS) IMPLANT
PACK ANTERIOR HIP CUSTOM (KITS) ×1 IMPLANT
SCREW 6.5MMX35MM (Screw) IMPLANT
STEM FEM ACTIS HIGH SZ8 (Stem) IMPLANT
SUT MNCRL AB 4-0 PS2 18 (SUTURE) ×1 IMPLANT
SUT STRATAFIX 0 PDS 27 VIOLET (SUTURE) ×1
SUT VIC AB 1 CT1 36 (SUTURE) ×3 IMPLANT
SUT VIC AB 2-0 CT1 27 (SUTURE) ×2
SUT VIC AB 2-0 CT1 TAPERPNT 27 (SUTURE) ×2 IMPLANT
SUTURE STRATFX 0 PDS 27 VIOLET (SUTURE) ×1 IMPLANT
SYR 3ML LL SCALE MARK (SYRINGE) IMPLANT
TRAY FOLEY MTR SLVR 16FR STAT (SET/KITS/TRAYS/PACK) IMPLANT
TUBE SUCTION HIGH CAP CLEAR NV (SUCTIONS) ×1 IMPLANT
WATER STERILE IRR 1000ML POUR (IV SOLUTION) ×1 IMPLANT

## 2023-06-03 NOTE — Anesthesia Postprocedure Evaluation (Signed)
Anesthesia Post Note  Patient: Veronica Hansen  Procedure(s) Performed: TOTAL HIP ARTHROPLASTY ANTERIOR APPROACH (Right: Hip)     Patient location during evaluation: PACU Anesthesia Type: General Level of consciousness: awake, awake and alert and oriented Pain management: pain level controlled Vital Signs Assessment: post-procedure vital signs reviewed and stable Respiratory status: spontaneous breathing, nonlabored ventilation and respiratory function stable Cardiovascular status: blood pressure returned to baseline and stable Postop Assessment: no headache, no backache, spinal receding and no apparent nausea or vomiting Anesthetic complications: no   No notable events documented.  Last Vitals:  Vitals:   06/03/23 1336 06/03/23 1559  BP: 107/63 112/64  Pulse: (!) 58 73  Resp: 16 16  Temp: 36.4 C 36.6 C  SpO2: 97% 100%    Last Pain:  Vitals:   06/03/23 1639  TempSrc:   PainSc: 0-No pain                 Collene Schlichter

## 2023-06-03 NOTE — Evaluation (Signed)
Physical Therapy Evaluation Patient Details Name: Veronica Hansen MRN: [id number] DOB: 18-May-1946 Today's Date: 06/03/2023  History of Present Illness  Pt is a 77 year old female s/p Rt THA on 06/03/23.  Clinical Impression  Pt is s/p THA resulting in the deficits listed below (see PT Problem List).  Pt will benefit from acute skilled PT to increase their independence and safety with mobility to facilitate discharge.  Pt assisted to bathroom per request and then ambulated in hallway.  Pt reports being very independent at baseline and hopes to return to independence quickly.  Pt will have her daughter staying with her for a couple days upon d/c.           Assistance Recommended at Discharge PRN  If plan is discharge home, recommend the following:  Can travel by private vehicle  Help with stairs or ramp for entrance;Assistance with cooking/housework        Equipment Recommendations None recommended by PT  Recommendations for Other Services       Functional Status Assessment Patient has had a recent decline in their functional status and demonstrates the ability to make significant improvements in function in a reasonable and predictable amount of time.     Precautions / Restrictions Precautions Precautions: Fall Restrictions Weight Bearing Restrictions: No Other Position/Activity Restrictions: WBAT      Mobility  Bed Mobility Overal bed mobility: Needs Assistance Bed Mobility: Supine to Sit     Supine to sit: HOB elevated, Supervision     General bed mobility comments: cues for technique    Transfers Overall transfer level: Needs assistance Equipment used: Rolling walker (2 wheels) Transfers: Sit to/from Stand Sit to Stand: Min guard           General transfer comment: verbal cues for UE positioning    Ambulation/Gait Ambulation/Gait assistance: Min guard Gait Distance (Feet): 100 Feet Assistive device: Rolling walker (2 wheels) Gait  Pattern/deviations: Step-to pattern, Decreased stance time - right, Antalgic       General Gait Details: verbal cues for sequence, RW positioning, posture, step length  Stairs            Wheelchair Mobility     Tilt Bed    Modified Rankin (Stroke Patients Only)       Balance                                             Pertinent Vitals/Pain Pain Assessment Pain Assessment: 0-10 Pain Score: 2  Pain Location: right hip Pain Descriptors / Indicators: Sore Pain Intervention(s): Repositioned, Premedicated before session, Monitored during session    Home Living Family/patient expects to be discharged to:: Private residence Living Arrangements: Alone Available Help at Discharge: Family (daughter is staying with her a couple days) Type of Home: House Home Access: Stairs to enter Entrance Stairs-Rails: Lawyer of Steps: 4   Home Layout: Able to live on main level with bedroom/bathroom Home Equipment: Agricultural consultant (2 wheels);Cane - quad;Cane - single point      Prior Function Prior Level of Function : Independent/Modified Independent             Mobility Comments: was using RW in home, cane in community prior to surgery       Hand Dominance        Extremity/Trunk Assessment  Communication   Communication: No difficulties  Cognition Arousal/Alertness: Awake/alert Behavior During Therapy: WFL for tasks assessed/performed Overall Cognitive Status: Within Functional Limits for tasks assessed                                          General Comments      Exercises     Assessment/Plan    PT Assessment Patient needs continued PT services  PT Problem List Decreased strength;Decreased activity tolerance;Decreased mobility;Decreased knowledge of use of DME;Pain       PT Treatment Interventions DME instruction;Gait training;Balance training;Therapeutic  exercise;Functional mobility training;Therapeutic activities;Patient/family education;Stair training    PT Goals (Current goals can be found in the Care Plan section)  Acute Rehab PT Goals PT Goal Formulation: With patient Time For Goal Achievement: 06/10/23 Potential to Achieve Goals: Good    Frequency Min 1X/week     Co-evaluation               AM-PAC PT "6 Clicks" Mobility  Outcome Measure Help needed turning from your back to your side while in a flat bed without using bedrails?: A Little Help needed moving from lying on your back to sitting on the side of a flat bed without using bedrails?: A Little Help needed moving to and from a bed to a chair (including a wheelchair)?: A Little Help needed standing up from a chair using your arms (e.g., wheelchair or bedside chair)?: A Little Help needed to walk in hospital room?: A Little Help needed climbing 3-5 steps with a railing? : A Lot 6 Click Score: 17    End of Session Equipment Utilized During Treatment: Gait belt Activity Tolerance: Patient tolerated treatment well Patient left: in chair;with call bell/phone within reach;with chair alarm set   PT Visit Diagnosis: Difficulty in walking, not elsewhere classified (R26.2)    Time: 6213-0865 PT Time Calculation (min) (ACUTE ONLY): 26 min   Charges:   PT Evaluation $PT Eval Low Complexity: 1 Low   PT General Charges $$ ACUTE PT VISIT: 1 Visit        Thomasene Mohair PT, DPT Physical Therapist Acute Rehabilitation Services Office: [phone]   Janan Halter [place] 06/03/2023, 4:40 PM

## 2023-06-03 NOTE — Discharge Instructions (Addendum)

## 2023-06-03 NOTE — Transfer of Care (Signed)
Immediate Anesthesia Transfer of Care Note  Patient: Veronica Hansen  Procedure(s) Performed: TOTAL HIP ARTHROPLASTY ANTERIOR APPROACH (Right: Hip)  Patient Location: PACU  Anesthesia Type:General  Level of Consciousness: awake, alert , and oriented  Airway & Oxygen Therapy: Patient Spontanous Breathing and Patient connected to face mask oxygen  Post-op Assessment: Report given to RN and Post -op Vital signs reviewed and stable  Post vital signs: Reviewed and stable  Last Vitals:  Vitals Value Taken Time  BP 95/73 06/03/23 1148  Temp    Pulse 59 06/03/23 1154  Resp 12 06/03/23 1154  SpO2 100 % 06/03/23 1154  Vitals shown include unfiled device data.  Last Pain:  Vitals:   06/03/23 0806  TempSrc: Oral  PainSc:          Complications: No notable events documented.

## 2023-06-03 NOTE — Progress Notes (Signed)
   06/03/23 2341  BiPAP/CPAP/SIPAP  Reason BIPAP/CPAP not in use Other(comment) (PT declined cpap for the night, feels like she will be fine one night without it.)

## 2023-06-03 NOTE — Interval H&P Note (Signed)
History and Physical Interval Note:  06/03/2023 7:12 AM  Veronica Hansen  has presented today for surgery, with the diagnosis of Right hip osteoarthritis.  The various methods of treatment have been discussed with the patient and family. After consideration of risks, benefits and other options for treatment, the patient has consented to  Procedure(s): TOTAL HIP ARTHROPLASTY ANTERIOR APPROACH (Right) as a surgical intervention.  The patient's history has been reviewed, patient examined, no change in status, stable for surgery.  I have reviewed the patient's chart and labs.  Questions were answered to the patient's satisfaction.     Shelda Pal

## 2023-06-03 NOTE — Anesthesia Procedure Notes (Addendum)
Procedure Name: MAC Date/Time: 06/03/2023 9:50 AM  Performed by: Maurene Capes, CRNAPre-anesthesia Checklist: Patient identified, Emergency Drugs available, Suction available and Patient being monitored Patient Re-evaluated:Patient Re-evaluated prior to induction Oxygen Delivery Method: Simple face mask Preoxygenation: Pre-oxygenation with 100% oxygen Induction Type: IV induction Placement Confirmation: positive ETCO2 Dental Injury: Teeth and Oropharynx as per pre-operative assessment

## 2023-06-03 NOTE — Anesthesia Procedure Notes (Addendum)
Procedure Name: Intubation Date/Time: 06/03/2023 10:04 AM  Performed by: Maurene Capes, CRNAPre-anesthesia Checklist: Patient identified, Emergency Drugs available, Suction available and Patient being monitored Patient Re-evaluated:Patient Re-evaluated prior to induction Oxygen Delivery Method: Circle System Utilized Preoxygenation: Pre-oxygenation with 100% oxygen Induction Type: IV induction Ventilation: Mask ventilation without difficulty Laryngoscope Size: Mac and 4 Tube type: Oral Tube size: 7.0 mm Number of attempts: 1 Airway Equipment and Method: Stylet Placement Confirmation: ETT inserted through vocal cords under direct vision, positive ETCO2 and breath sounds checked- equal and bilateral Secured at: 22 cm Tube secured with: Tape Dental Injury: Teeth and Oropharynx as per pre-operative assessment

## 2023-06-03 NOTE — Op Note (Signed)
NAME:  [name]                ACCOUNT NO.: [id number]      MEDICAL RECORD NO.: [id number]      FACILITY:  [facility]      PHYSICIAN:  Shelda Pal  DATE OF BIRTH:  [birth year]     DATE OF PROCEDURE:  06/03/2023                                 OPERATIVE REPORT         PREOPERATIVE DIAGNOSIS: Right  hip osteoarthritis.      POSTOPERATIVE DIAGNOSIS:  Right hip osteoarthritis.      PROCEDURE:  Right total hip replacement through an anterior approach   utilizing DePuy THR system, component size 54 mm pinnacle cup, a size 36+4 neutral   Altrex liner, a size 8 Hi Actis stem with a 36+1.5 delta ceramic   ball.      SURGEON:  Madlyn Frankel. Charlann Boxer, M.D.      ASSISTANT:  Rosalene Billings, PA-C     ANESTHESIA:  General.      SPECIMENS:  None.      COMPLICATIONS:  None.      BLOOD LOSS:  800 cc     DRAINS:  None.      INDICATION OF THE PROCEDURE:  Veronica Hansen is a 77 y.o. female who had   presented to office for evaluation of right hip pain.  Radiographs revealed   progressive degenerative changes with bone-on-bone   articulation of the  hip joint, including subchondral cystic changes and osteophytes.  The patient had painful limited range of   motion significantly affecting their overall quality of life and function.  The patient was failing to    respond to conservative measures including medications and/or injections and activity modification and at this point was ready   to proceed with more definitive measures.  Consent was obtained for   benefit of pain relief.  Specific risks of infection, DVT, component   failure, dislocation, neurovascular injury, and need for revision surgery were reviewed in the office.     PROCEDURE IN DETAIL:  The patient was brought to operative theater.   Once adequate anesthesia, preoperative antibiotics, 2 gm of Ancef, 1 gm of Tranexamic Acid, and 10 mg of Decadron were administered, the patient was positioned supine  on the [employer] table.  Once the patient was safely positioned with adequate padding of boney prominences we predraped out the hip, and used fluoroscopy to confirm orientation of the pelvis.      The right hip was then prepped and draped from proximal iliac crest to   mid thigh with a shower curtain technique.      Time-out was performed identifying the patient, planned procedure, and the appropriate extremity.     An incision was then made 2 cm lateral to the   anterior superior iliac spine extending over the orientation of the   tensor fascia lata muscle and sharp dissection was carried down to the   fascia of the muscle.      The fascia was then incised.  The muscle belly was identified and swept   laterally and retractor placed along the superior neck.  Following   cauterization of the circumflex vessels and removing some pericapsular   fat, a second cobra retractor was placed on the inferior  neck.  A T-capsulotomy was made along the line of the   superior neck to the trochanteric fossa, then extended proximally and   distally.  Tag sutures were placed and the retractors were then placed   intracapsular.  We then identified the trochanteric fossa and   orientation of my neck cut and then made a neck osteotomy with the femur on traction.  The femoral   head was removed without difficulty or complication.  Traction was let   off and retractors were placed posterior and anterior around the   acetabulum.      The labrum and foveal tissue were debrided.  I began reaming with a 50 mm   reamer and reamed up to 53 mm reamer with good bony bed preparation and a 54 mm  cup was chosen.  The final 54 mm Pinnacle cup was then impacted under fluoroscopy to confirm the depth of penetration and orientation with respect to   Abduction and forward flexion.  A screw was placed into the ilium followed by the hole eliminator.  The final   36+4 neutral Altrex liner was impacted with good visualized rim  fit.  The cup was positioned anatomically within the acetabular portion of the pelvis.      At this point, the femur was rolled to 100 degrees.  Further capsule was   released off the inferior aspect of the femoral neck.  I then   released the superior capsule proximally.  With the leg in a neutral position the hook was placed laterally   along the femur under the vastus lateralis origin and elevated manually and then held in position using the hook attachment on the bed.  The leg was then extended and adducted with the leg rolled to 100   degrees of external rotation.  Retractors were placed along the medial calcar and posteriorly over the greater trochanter.  Once the proximal femur was fully   exposed, I used a box osteotome to set orientation.  I then began   broaching with the starting chili pepper broach and passed this by hand and then broached up to 8.  With the 8 broach in place I chose a high offset neck and did several trial reductions.  The offset was appropriate, leg lengths   appeared to be equal best matched with the +1.5 head ball trial confirmed radiographically.   Given these findings, I went ahead and dislocated the hip, repositioned all   retractors and positioned the right hip in the extended and abducted position.  The final 8 Hi Actis stem was   chosen and it was impacted down to the level of neck cut.  Based on this   and the trial reductions, a final 36+1.5 delta ceramic ball was chosen and   impacted onto a clean and dry trunnion, and the hip was reduced.  The   hip had been irrigated throughout the case again at this point.  I did   reapproximate the superior capsular leaflet to the anterior leaflet   using #1 Vicryl.  The fascia of the   tensor fascia lata muscle was then reapproximated using #1 Vicryl and #0 Stratafix sutures.  The   remaining wound was closed with 2-0 Vicryl and running 4-0 Monocryl.   The hip was cleaned, dried, and dressed sterilely using  Dermabond and   Aquacel dressing.  The patient was then brought   to recovery room in stable condition tolerating the procedure well.    Morrie Sheldon  Domenic Schwab, PA-C was present for the entirety of the case involved from   preoperative positioning, perioperative retractor management, general   facilitation of the case, as well as primary wound closure as assistant.            Madlyn Frankel Charlann Boxer, M.D.        06/03/2023 11:19 AM

## 2023-06-04 ENCOUNTER — Encounter (HOSPITAL_COMMUNITY): Payer: Self-pay | Admitting: Orthopedic Surgery

## 2023-06-04 DIAGNOSIS — M1611 Unilateral primary osteoarthritis, right hip: Secondary | ICD-10-CM | POA: Diagnosis not present

## 2023-06-04 LAB — BASIC METABOLIC PANEL
Anion gap: 8 (ref 5–15)
BUN: 14 mg/dL (ref 8–23)
CO2: 23 mmol/L (ref 22–32)
Calcium: 8.9 mg/dL (ref 8.9–10.3)
Chloride: 104 mmol/L (ref 98–111)
Creatinine, Ser: 0.52 mg/dL (ref 0.44–1.00)
GFR, Estimated: >60 mL/min (ref >60)
Glucose, Bld: 108 mg/dL — ABNORMAL HIGH (ref 70–99)
Potassium: 4.3 mmol/L (ref 3.5–5.1)
Sodium: 135 mmol/L (ref 135–145)

## 2023-06-04 LAB — CBC
HCT: 32.4 % — ABNORMAL LOW (ref 36.0–46.0)
Hemoglobin: 9.8 g/dL — ABNORMAL LOW (ref 12.0–15.0)
MCH: 24.1 pg — ABNORMAL LOW (ref 26.0–34.0)
MCHC: 30.2 g/dL (ref 30.0–36.0)
MCV: 79.8 fL — ABNORMAL LOW (ref 80.0–100.0)
Platelets: 209 10*3/uL (ref 150–400)
RBC: 4.06 MIL/uL (ref 3.87–5.11)
RDW: 16.8 % — ABNORMAL HIGH (ref 11.5–15.5)
WBC: 12.1 10*3/uL — ABNORMAL HIGH (ref 4.0–10.5)
nRBC: 0 % (ref 0.0–0.2)

## 2023-06-04 LAB — GLUCOSE, CAPILLARY
Glucose-Capillary: 99 mg/dL (ref 70–99)
Glucose-Capillary: 97 mg/dL (ref 70–99)

## 2023-06-04 MED ORDER — ASPIRIN 81 MG PO CHEW: 81.0000 mg | CHEWABLE_TABLET | Freq: Two times a day (BID) | ORAL | 0 refills | Status: AC

## 2023-06-04 MED ORDER — METHOCARBAMOL 500 MG PO TABS: 500.0000 mg | ORAL_TABLET | Freq: Four times a day (QID) | ORAL | 2 refills | Status: DC | PRN

## 2023-06-04 MED ORDER — OXYCODONE HCL 5 MG PO TABS: 5.0000 mg | ORAL_TABLET | ORAL | 0 refills | Status: DC | PRN

## 2023-06-04 MED ORDER — CEFADROXIL 500 MG PO CAPS: 500.0000 mg | ORAL_CAPSULE | Freq: Two times a day (BID) | ORAL | 0 refills | Status: AC

## 2023-06-04 MED ORDER — POLYETHYLENE GLYCOL 3350 17 G PO PACK: 17.0000 g | PACK | Freq: Two times a day (BID) | ORAL | 0 refills | Status: DC

## 2023-06-04 MED ORDER — SENNA 8.6 MG PO TABS: 2.0000 | ORAL_TABLET | Freq: Every day | ORAL | 0 refills | Status: AC

## 2023-06-04 NOTE — Care Management Obs Status (Signed)
MEDICARE OBSERVATION STATUS NOTIFICATION   Patient Details  Name: Veronica Hansen MRN: [id number] Date of Birth: [birth year]   Medicare Observation Status Notification Given:  Yes    Ewing Schlein, LCSW 06/04/2023, 9:54 AM

## 2023-06-04 NOTE — Plan of Care (Signed)
  Problem: Fluid Volume: Goal: Ability to maintain a balanced intake and output will improve Outcome: Completed/Met   Problem: Nutritional: Goal: Maintenance of adequate nutrition will improve Outcome: Completed/Met   Problem: Skin Integrity: Goal: Risk for impaired skin integrity will decrease Outcome: Adequate for Discharge   Problem: Tissue Perfusion: Goal: Adequacy of tissue perfusion will improve Outcome: Adequate for Discharge   Problem: Education: Goal: Knowledge of the prescribed therapeutic regimen will improve Outcome: Progressing Goal: Understanding of discharge needs will improve Outcome: Progressing Goal: Individualized Educational Video(s) Outcome: Completed/Met   Problem: Activity: Goal: Ability to avoid complications of mobility impairment will improve Outcome: Adequate for Discharge Goal: Ability to tolerate increased activity will improve Outcome: Adequate for Discharge   Problem: Clinical Measurements: Goal: Postoperative complications will be avoided or minimized Outcome: Progressing   Problem: Pain Management: Goal: Pain level will decrease with appropriate interventions Outcome: Progressing   Problem: Skin Integrity: Goal: Will show signs of wound healing Outcome: Progressing   Problem: Education: Goal: Knowledge of General Education information will improve Description: Including pain rating scale, medication(s)/side effects and non-pharmacologic comfort measures Outcome: Progressing   Problem: Health Behavior/Discharge Planning: Goal: Ability to manage health-related needs will improve Outcome: Progressing   Problem: Clinical Measurements: Goal: Ability to maintain clinical measurements within normal limits will improve Outcome: Progressing Goal: Will remain free from infection Outcome: Progressing Goal: Diagnostic test results will improve Outcome: Progressing Goal: Respiratory complications will improve Outcome: Progressing Goal:  Cardiovascular complication will be avoided Outcome: Progressing   Problem: Activity: Goal: Risk for activity intolerance will decrease Outcome: Adequate for Discharge   Problem: Nutrition: Goal: Adequate nutrition will be maintained Outcome: Completed/Met   Problem: Coping: Goal: Level of anxiety will decrease Outcome: Progressing   Problem: Elimination: Goal: Will not experience complications related to bowel motility Outcome: Progressing Goal: Will not experience complications related to urinary retention Outcome: Completed/Met   Problem: Pain Managment: Goal: General experience of comfort will improve Outcome: Progressing   Problem: Safety: Goal: Ability to remain free from injury will improve Outcome: Progressing   Problem: Skin Integrity: Goal: Risk for impaired skin integrity will decrease Outcome: Progressing

## 2023-06-04 NOTE — Progress Notes (Signed)
Subjective: 1 Day Post-Op Procedure(s) (LRB): TOTAL HIP ARTHROPLASTY ANTERIOR APPROACH (Right) Patient reports pain as mild.   Patient seen in rounds with Dr. Charlann Boxer. Patient is up in the recliner this morning. No acute events overnight. Patient ambulated 100 feet with PT yesterday. We will start therapy today.   Objective: Vital signs in last 24 hours: Temp:  [97.5 F (36.4 C)-97.9 F (36.6 C)] 97.7 F (36.5 C) (07/19 0610) Pulse Rate:  [56-73] 69 (07/19 0610) Resp:  [7-18] 16 (07/19 0610) BP: (93-137)/(48-84) 131/66 (07/19 0610) SpO2:  [94 %-100 %] 98 % (07/19 0610)  Intake/Output from previous day:  Intake/Output Summary (Last 24 hours) at 06/04/2023 0740 Last data filed at 06/04/2023 0600 Gross per 24 hour  Intake 2737.22 ml  Output 1150 ml  Net 1587.22 ml     Intake/Output this shift: No intake/output data recorded.  Labs: Recent Labs    06/04/23 0356  HGB 9.8*   Recent Labs    06/04/23 0356  WBC 12.1*  RBC 4.06  HCT 32.4*  PLT 209   Recent Labs    06/04/23 0356  NA 135  K 4.3  CL 104  CO2 23  BUN 14  CREATININE 0.52  GLUCOSE 108*  CALCIUM 8.9   No results for input(s): "LABPT", "INR" in the last 72 hours.  Exam: General - Patient is Alert and Oriented Extremity - Neurologically intact Sensation intact distally Intact pulses distally Dorsiflexion/Plantar flexion intact Dressing - dressing C/D/I Motor Function - intact, moving foot and toes well on exam.   Past Medical History:  Diagnosis Date   A-fib (HCC)    A-fib (HCC) 07/2018   treated with ablation procedure in October 2019   Allergic rhinitis    Anemia    Arthritis    joints hurt   ASCVD (arteriosclerotic cardiovascular disease)    Atypical chest pain    cardiology consult showed an echocardiogram with left ventricular hypertrophy normal left ventricular function.   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   COPD (chronic obstructive pulmonary disease) (HCC)    Depression     husband died of Covid 06-26-20   Diabetes mellitus    type 2, no longer takes medication    Diabetes mellitus (HCC)    Dyspnea    GERD (gastroesophageal reflux disease)    History of blood transfusion    HTN (hypertension)    Hypercholesterolemia    Hyperlipidemia    Hypertension    Incontinence    Iron deficiency anemia    Lower esophageal sphincter, relaxation    Morbid obesity (HCC)    OSA (obstructive sleep apnea)    OSA on CPAP 03/07/2014   OSA on CPAP    Proteinuria 06-26-00   Psoriasis    RLL pneumonia Jun 26, 2016   Runny nose    seasonal - poss allergies   Sinus problem    SOB (shortness of breath)    Tobacco use    Stopped in 2003-06-27    Assessment/Plan: 1 Day Post-Op Procedure(s) (LRB): TOTAL HIP ARTHROPLASTY ANTERIOR APPROACH (Right) Principal Problem:   S/P total right hip arthroplasty  Estimated body mass index is 39 kg/m as calculated from the following:   Height as of this encounter: 5' 5.5" (1.664 m).   Weight as of this encounter: 108 kg. Advance diet Up with therapy D/C IV fluids  DVT Prophylaxis - Aspirin Weight bearing as tolerated.  Hgb stable at 9.8 this AM.  Plan is to go Home after hospital  stay. Plan for discharge today after meeting goals with therapy. Follow up in the office in 2 weeks.   Rosalene Billings, PA-C Orthopedic Surgery [phone] 06/04/2023, 7:40 AM

## 2023-06-04 NOTE — TOC Transition Note (Signed)
Transition of Care [facility]) - CM/SW Discharge Note  Patient Details  Name: Veronica Hansen MRN: [id number] Date of Birth: 02/04/1946  [facility] CM/SW Contact:  Ewing Schlein, LCSW Phone Number: 06/04/2023, 10:03 AM  Clinical Narrative: Patient is expected to discharge home after working with PT. CSW met with patient to confirm discharge plan. Patient will go home with a home exercise program (HEP). Patient has a rolling walker and cane at home, so there are no DME needs at this time. TOC signing off.    Final next level of care: Home/Self Care Barriers to Discharge: No Barriers Identified  Patient Goals and CMS Choice Choice offered to / list presented to : NA  Discharge Plan and Services Additional resources added to the After Visit Summary for        DME Arranged: N/A DME Agency: NA  Social Determinants of Health (SDOH) Interventions SDOH Screenings   Food Insecurity: No Food Insecurity (06/03/2023)  Housing: Low Risk  (06/03/2023)  Transportation Needs: No Transportation Needs (06/03/2023)  Utilities: Not At Risk (06/03/2023)  Tobacco Use: Medium Risk (06/03/2023)   Readmission Risk Interventions     No data to display

## 2023-06-04 NOTE — Progress Notes (Signed)
Physical Therapy Treatment Patient Details Name: Veronica Hansen MRN: [id number] DOB: 10-Nov-1946 Today's Date: 06/04/2023   History of Present Illness Pt is a 77 year old female s/p Rt THA on 06/03/23.    PT Comments  Pt ambulated in hallway, practiced safe stair technique and performed LE exercises.  Pt provided with HEP and had no further questions.  Pt ready for d/c home today.      Assistance Recommended at Discharge PRN  If plan is discharge home, recommend the following:  Can travel by private vehicle    Help with stairs or ramp for entrance;Assistance with cooking/housework      Equipment Recommendations  None recommended by PT    Recommendations for Other Services       Precautions / Restrictions Precautions Precautions: Fall Restrictions Other Position/Activity Restrictions: WBAT     Mobility  Bed Mobility               General bed mobility comments: pt in recliner    Transfers Overall transfer level: Needs assistance Equipment used: Rolling walker (2 wheels) Transfers: Sit to/from Stand Sit to Stand: Min guard           General transfer comment: verbal cues for UE positioning    Ambulation/Gait Ambulation/Gait assistance: Min guard Gait Distance (Feet): 200 Feet Assistive device: Rolling walker (2 wheels) Gait Pattern/deviations: Step-to pattern, Decreased stance time - right, Antalgic       General Gait Details: verbal cues for sequence, RW positioning, posture, step length   Stairs Stairs: Yes Stairs assistance: Min guard Stair Management: Step to pattern, Forwards, One rail Right, With cane Number of Stairs: 3 General stair comments: verbal cues for sequence and safe technique, performed twice, pt reports understanding   Wheelchair Mobility     Tilt Bed    Modified Rankin (Stroke Patients Only)       Balance                                            Cognition Arousal/Alertness:  Awake/alert Behavior During Therapy: WFL for tasks assessed/performed Overall Cognitive Status: Within Functional Limits for tasks assessed                                          Exercises Total Joint Exercises Ankle Circles/Pumps: AROM, Both, 10 reps Quad Sets: AROM, Both, 10 reps Short Arc Quad: AROM, Right, 10 reps Heel Slides: AAROM, Right, 10 reps Hip ABduction/ADduction: AROM, AAROM, Standing, Supine, Right, 10 reps Long Arc Quad: AROM, Right, Seated, 10 reps Knee Flexion: AROM, Right, Standing, 10 reps Marching in Standing: AROM, Right, Standing, 10 reps Standing Hip Extension: AROM, Right, Standing, 10 reps    General Comments        Pertinent Vitals/Pain Pain Assessment Pain Assessment: 0-10 Pain Score: 3  Pain Location: right hip Pain Descriptors / Indicators: Sore Pain Intervention(s): Repositioned, Monitored during session    Home Living                          Prior Function            PT Goals (current goals can now be found in the care plan section) Progress towards PT goals: Progressing toward goals  Frequency    Min 1X/week      PT Plan Current plan remains appropriate    Co-evaluation              AM-PAC PT "6 Clicks" Mobility   Outcome Measure  Help needed turning from your back to your side while in a flat bed without using bedrails?: A Little Help needed moving from lying on your back to sitting on the side of a flat bed without using bedrails?: A Little Help needed moving to and from a bed to a chair (including a wheelchair)?: A Little Help needed standing up from a chair using your arms (e.g., wheelchair or bedside chair)?: A Little Help needed to walk in hospital room?: A Little Help needed climbing 3-5 steps with a railing? : A Little 6 Click Score: 18    End of Session Equipment Utilized During Treatment: Gait belt Activity Tolerance: Patient tolerated treatment well Patient left: in  chair;with call bell/phone within reach;with chair alarm set   PT Visit Diagnosis: Difficulty in walking, not elsewhere classified (R26.2)     Time: 0350-0938 PT Time Calculation (min) (ACUTE ONLY): 26 min  Charges:    $Gait Training: 8-22 mins $Therapeutic Exercise: 8-22 mins PT General Charges $$ ACUTE PT VISIT: 1 Visit           Paulino Door, DPT Physical Therapist Acute Rehabilitation Services Office: [phone]    Janan Halter [place] 06/04/2023, 12:29 PM

## 2023-06-10 ENCOUNTER — Encounter (HOSPITAL_BASED_OUTPATIENT_CLINIC_OR_DEPARTMENT_OTHER): Payer: Medicare Other | Admitting: Obstetrics & Gynecology

## 2023-06-15 NOTE — Discharge Summary (Signed)
Patient ID: Veronica Hansen MRN: [id number] DOB/AGE: 1946/02/14 77 y.o.  Admit date: 06/03/2023 Discharge date: 06/04/2023  Admission Diagnoses:  Right hip osteoarthritis  Discharge Diagnoses:  Principal Problem:   S/P total right hip arthroplasty   Past Medical History:  Diagnosis Date   A-fib (HCC)    A-fib (HCC) 07/2018   treated with ablation procedure in October 2019   Allergic rhinitis    Anemia    Arthritis    joints hurt   ASCVD (arteriosclerotic cardiovascular disease)    Atypical chest pain    cardiology consult showed an echocardiogram with left ventricular hypertrophy normal left ventricular function.   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   COPD (chronic obstructive pulmonary disease) (HCC)    Depression    husband died of Covid 07-03-2020   Diabetes mellitus    type 2, no longer takes medication    Diabetes mellitus (HCC)    Dyspnea    GERD (gastroesophageal reflux disease)    History of blood transfusion    HTN (hypertension)    Hypercholesterolemia    Hyperlipidemia    Hypertension    Incontinence    Iron deficiency anemia    Lower esophageal sphincter, relaxation    Morbid obesity (HCC)    OSA (obstructive sleep apnea)    OSA on CPAP 03/07/2014   OSA on CPAP    Proteinuria 2001   Psoriasis    RLL pneumonia 07-03-16   Runny nose    seasonal - poss allergies   Sinus problem    SOB (shortness of breath)    Tobacco use    Stopped in 2003-07-04    Surgeries: Procedure(s): TOTAL HIP ARTHROPLASTY ANTERIOR APPROACH on 06/03/2023   Consultants:   Discharged Condition: Improved  Hospital Course: Veronica Hansen is an 77 y.o. female who was admitted 06/03/2023 for operative treatment ofS/P total right hip arthroplasty. Patient has severe unremitting pain that affects sleep, daily activities, and work/hobbies. After pre-op clearance the patient was taken to the operating room on 06/03/2023 and underwent  Procedure(s): TOTAL HIP ARTHROPLASTY ANTERIOR APPROACH.     Patient was given perioperative antibiotics:  Anti-infectives (From admission, onward)    Start     Dose/Rate Route Frequency Ordered Stop   06/04/23 0000  cefadroxil (DURICEF) 500 MG capsule        500 mg Oral 2 times daily 06/04/23 0743 06/11/23 2359   06/03/23 1600  ceFAZolin (ANCEF) IVPB 2g/100 mL premix        2 g 200 mL/hr over 30 Minutes Intravenous Every 6 hours 06/03/23 1400 06/03/23 Jul 03, 2148   06/03/23 0730  ceFAZolin (ANCEF) IVPB 2g/100 mL premix        2 g 200 mL/hr over 30 Minutes Intravenous On call to O.R. 06/03/23 4098 06/03/23 0948        Patient was given sequential compression devices, early ambulation, and chemoprophylaxis to prevent DVT. Patient worked with PT and was meeting their goals regarding safe ambulation and transfers.  Patient benefited maximally from hospital stay and there were no complications.    Recent vital signs: No data found.   Recent laboratory studies: No results for input(s): "WBC", "HGB", "HCT", "PLT", "NA", "K", "CL", "CO2", "BUN", "CREATININE", "GLUCOSE", "INR", "CALCIUM" in the last 72 hours.  Invalid input(s): "PT", "2"   Discharge Medications:   Allergies as of 06/04/2023       Reactions   Orlistat Diarrhea   Other reaction(s): severe diarrhea   Phentermine Hcl  Other reaction(s): elevated BP        Medication List     TAKE these medications    amLODipine 5 MG tablet Commonly known as: NORVASC Take 1 tablet (5 mg total) by mouth daily.   aspirin 81 MG chewable tablet Chew 1 tablet (81 mg total) by mouth 2 (two) times daily for 28 days.   CENTRUM ADULTS PO Take 1 tablet by mouth daily.   escitalopram 10 MG tablet Commonly known as: LEXAPRO Take 10 mg by mouth daily.   ezetimibe 10 MG tablet Commonly known as: ZETIA Take 10 mg by mouth daily.   ferrous sulfate 325 (65 FE) MG tablet Take 325 mg by mouth every other day.   methocarbamol 500 MG tablet Commonly known as: ROBAXIN Take 1 tablet (500 mg  total) by mouth every 6 (six) hours as needed for muscle spasms.   metoprolol succinate 100 MG 24 hr tablet Commonly known as: TOPROL-XL Take 100 mg by mouth daily.   olmesartan 40 MG tablet Commonly known as: BENICAR Take 40 mg by mouth daily.   OneTouch Delica Lancets 33G Misc by Does not apply route as directed.   OneTouch Verio test strip Generic drug: glucose blood 1 each by Other route in the morning, at noon, in the evening, and at bedtime. Use as instructed   oxyCODONE 5 MG immediate release tablet Commonly known as: Oxy IR/ROXICODONE Take 1 tablet (5 mg total) by mouth every 4 (four) hours as needed for severe pain.   polyethylene glycol 17 g packet Commonly known as: MIRALAX / GLYCOLAX Take 17 g by mouth 2 (two) times daily.   senna 8.6 MG Tabs tablet Commonly known as: SENOKOT Take 2 tablets (17.2 mg total) by mouth at bedtime for 14 days.   simvastatin 40 MG tablet Commonly known as: ZOCOR Take 40 mg by mouth daily.   SYSTANE OP Place 1 drop into both eyes as needed (dry eyes).   Trulicity 4.5 MG/0.5ML Sopn Generic drug: Dulaglutide Inject 4.5 mg into the skin every 7 (seven) days.   Viactiv Calcium Plus D 650-12.5-40 MG-MCG Chew Generic drug: Calcium-Vitamin D-Vitamin K Chew 1 tablet by mouth daily.       ASK your doctor about these medications    cefadroxil 500 MG capsule Commonly known as: DURICEF Take 1 capsule (500 mg total) by mouth 2 (two) times daily for 7 days. Ask about: Should I take this medication?               Discharge Care Instructions  (From admission, onward)           Start     Ordered   06/04/23 0000  Change dressing       Comments: Maintain surgical dressing until follow up in the clinic. If the edges start to pull up, may reinforce with tape. If the dressing is no longer working, may remove and cover with gauze and tape, but must keep the area dry and clean.  Call with any questions or concerns.   06/04/23 0743             Diagnostic Studies: DG Pelvis Portable  Result Date: 06/03/2023 CLINICAL DATA:  Status post right hip arthroplasty EXAM: PORTABLE PELVIS 2 VIEWS COMPARISON:  None Available. FINDINGS: Postsurgical changes from right hip arthroplasty. Hardware appears intact and well seated. There is no evidence of pelvic fracture or diastasis. No pelvic bone lesions are seen. Degenerative changes of the left hip. Subcutaneous emphysema overlying the lateral  proximal right femur. IMPRESSION: Postsurgical changes from right hip arthroplasty. Electronically Signed   By: Agustin Cree M.D.   On: 06/03/2023 14:06   DG HIP UNILAT WITH PELVIS 1V RIGHT  Result Date: 06/03/2023 CLINICAL DATA:  Right hip arthroplasty EXAM: DG HIP (WITH OR WITHOUT PELVIS) 1V RIGHT COMPARISON:  None Available. FINDINGS: Two fluoroscopic images obtained during right hip arthroplasty. 0.2 minutes fluoro time utilized. Radiation dose 2.2640 mGy Kerma. Please see performing physicians operative report for full details IMPRESSION: Fluoroscopic images were obtained for intraoperative guidance of right hip arthroplasty. Electronically Signed   By: Agustin Cree M.D.   On: 06/03/2023 14:05   DG C-Arm 1-60 Min-No Report  Result Date: 06/03/2023 Fluoroscopy was utilized by the requesting physician.  No radiographic interpretation.   DG C-Arm 1-60 Min-No Report  Result Date: 06/03/2023 Fluoroscopy was utilized by the requesting physician.  No radiographic interpretation.    Disposition: Discharge disposition: 01-Home or Self Care       Discharge Instructions     Call MD / Call 911   Complete by: As directed    If you experience chest pain or shortness of breath, CALL 911 and be transported to the hospital emergency room.  If you develope a fever above 101 F, pus (white drainage) or increased drainage or redness at the wound, or calf pain, call your surgeon's office.   Change dressing   Complete by: As directed    Maintain surgical  dressing until follow up in the clinic. If the edges start to pull up, may reinforce with tape. If the dressing is no longer working, may remove and cover with gauze and tape, but must keep the area dry and clean.  Call with any questions or concerns.   Constipation Prevention   Complete by: As directed    Drink plenty of fluids.  Prune juice may be helpful.  You may use a stool softener, such as Colace (over the counter) 100 mg twice a day.  Use MiraLax (over the counter) for constipation as needed.   Diet - low sodium heart healthy   Complete by: As directed    Increase activity slowly as tolerated   Complete by: As directed    Weight bearing as tolerated with assist device (walker, cane, etc) as directed, use it as long as suggested by your surgeon or therapist, typically at least 4-6 weeks.   Post-operative opioid taper instructions:   Complete by: As directed    POST-OPERATIVE OPIOID TAPER INSTRUCTIONS: It is important to wean off of your opioid medication as soon as possible. If you do not need pain medication after your surgery it is ok to stop day one. Opioids include: Codeine, Hydrocodone(Norco, Vicodin), Oxycodone(Percocet, oxycontin) and hydromorphone amongst others.  Long term and even short term use of opiods can cause: Increased pain response Dependence Constipation Depression Respiratory depression And more.  Withdrawal symptoms can include Flu like symptoms Nausea, vomiting And more Techniques to manage these symptoms Hydrate well Eat regular healthy meals Stay active Use relaxation techniques(deep breathing, meditating, yoga) Do Not substitute Alcohol to help with tapering If you have been on opioids for less than two weeks and do not have pain than it is ok to stop all together.  Plan to wean off of opioids This plan should start within one week post op of your joint replacement. Maintain the same interval or time between taking each dose and first decrease the  dose.  Cut the total daily intake of opioids  by one tablet each day Next start to increase the time between doses. The last dose that should be eliminated is the evening dose.      TED hose   Complete by: As directed    Use stockings (TED hose) for 2 weeks on both leg(s).  You may remove them at night for sleeping.        Follow-up Information     Durene Romans, MD. Schedule an appointment as soon as possible for a visit in 2 week(s).   Specialty: Orthopedic Surgery Contact information: [address] [place] [address] [phone]                  Signed: Cassandria Anger 06/15/2023, 8:58 AM

## 2023-07-13 ENCOUNTER — Ambulatory Visit (HOSPITAL_COMMUNITY): Payer: Medicare Other

## 2023-07-14 ENCOUNTER — Ambulatory Visit (HOSPITAL_COMMUNITY)
Admission: RE | Admit: 2023-07-14 | Discharge: 2023-07-14 | Disposition: A | Discharge status: Home / Self Care | Payer: Medicare Other | Source: Ambulatory Visit | Attending: Internal Medicine | Admitting: Internal Medicine

## 2023-07-14 DIAGNOSIS — I779 Disorder of arteries and arterioles, unspecified: Secondary | ICD-10-CM | POA: Insufficient documentation

## 2023-07-14 LAB — POCT I-STAT CREATININE: Creatinine, Ser: 0.6 mg/dL (ref 0.44–1.00)

## 2023-07-14 MED ORDER — IOHEXOL 350 MG/ML SOLN
75.0000 mL | Freq: Once | INTRAVENOUS | Status: AC | PRN
  Administered 2023-07-14: 75 mL via INTRAVENOUS

## 2023-07-15 ENCOUNTER — Telehealth: Payer: Self-pay | Admitting: Internal Medicine

## 2023-07-15 ENCOUNTER — Telehealth: Payer: Self-pay

## 2023-07-15 DIAGNOSIS — I77819 Aortic ectasia, unspecified site: Secondary | ICD-10-CM

## 2023-07-15 DIAGNOSIS — N2889 Other specified disorders of kidney and ureter: Secondary | ICD-10-CM

## 2023-07-15 NOTE — Telephone Encounter (Signed)
Caller Veronica Hansen) is reporting abnormal results.

## 2023-07-15 NOTE — Telephone Encounter (Signed)
Left a message for the pt to call back. Will send to triage since I am out of the office tomorrow.

## 2023-07-15 NOTE — Telephone Encounter (Signed)
Spoke with Elnita Maxwell who is calling to report the abnormal findings below from patient's CT scan. The final report has been uploaded.  2. 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 Further evaluation with dedicated renal protocol CT or MRI of the abdomen is recommended.

## 2023-07-15 NOTE — Telephone Encounter (Signed)
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

## 2023-07-16 NOTE — Telephone Encounter (Signed)
Pt returning nurses phone call. Please advise ?

## 2023-07-16 NOTE — Telephone Encounter (Signed)
I spoke with patient and reviewed CT result with her. Referral placed to [facility].   Order placed for Renal CT to be done at [facility].

## 2023-07-21 ENCOUNTER — Encounter: Payer: Self-pay | Admitting: Surgery

## 2023-07-21 ENCOUNTER — Institutional Professional Consult (permissible substitution): Payer: Medicare Other | Admitting: Surgery

## 2023-07-21 VITALS — BP 152/79 | HR 71 | Resp 20 | Ht 65.0 in | Wt 238.0 lb

## 2023-07-21 DIAGNOSIS — I7121 Aneurysm of the ascending aorta, without rupture: Secondary | ICD-10-CM | POA: Diagnosis not present

## 2023-07-21 NOTE — Progress Notes (Signed)
Cardiothoracic Surgery Consultation  PCP is Garlan Fillers, MD Referring Provider is Pricilla Riffle, MD  Chief Complaint  Patient presents with   Thoracic Aortic Aneurysm    CTA chest 8/28    HPI:  The patient is a 77 year old woman with history of type 2 diabetes, hypertension, hyperlipidemia, morbid obesity with significant weight loss after bariatric surgery followed by dietary modification and exercise, atrial fibrillation/flutter status post ablation in 08/13/2018, remote smoking and COPD, OSA on CPAP, who underwent a cardiac PET scan in January 2024 which was negative for ischemia but did show an incidental 4.6 cm fusiform ascending aortic aneurysm.  She was scheduled for a follow-up CTA in 6 months which was done on 07/14/2023 and continued to show a 4.6 cm fusiform ascending aortic aneurysm.  She had an echocardiogram in 09-28-19which showed a trileaflet aortic valve with no stenosis or insufficiency.  There is no family history of aortic aneurysm, aortic dissection, connective tissue disorder, or bicuspid aortic valve disease.  She is widowed since her husband died from COVID in 13-Aug-2020.  She remains active doing water aerobics but had to take a break after recent right hip replacement in July.  She is now starting back at water aerobics. Past Medical History:  Diagnosis Date   A-fib (HCC)    A-fib (HCC) 08-13-2018   treated with ablation procedure in October 2019   Allergic rhinitis    Anemia    Arthritis    joints hurt   ASCVD (arteriosclerotic cardiovascular disease)    Atypical chest pain    cardiology consult showed an echocardiogram with left ventricular hypertrophy normal left ventricular function.   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   COPD (chronic obstructive pulmonary disease) (HCC)    Depression    husband died of Covid 08-13-2020   Diabetes mellitus    type 2, no longer takes medication    Diabetes mellitus (HCC)    Dyspnea    GERD (gastroesophageal reflux disease)     History of blood transfusion    HTN (hypertension)    Hypercholesterolemia    Hyperlipidemia    Hypertension    Incontinence    Iron deficiency anemia    Lower esophageal sphincter, relaxation    Morbid obesity (HCC)    OSA (obstructive sleep apnea)    OSA on CPAP 03/07/2014   OSA on CPAP    Proteinuria 2001   Psoriasis    RLL pneumonia 06/2016   Runny nose    seasonal - poss allergies   Sinus problem    SOB (shortness of breath)    Tobacco use    Stopped in 08/14/2003    Past Surgical History:  Procedure Laterality Date   A-FLUTTER ABLATION N/A 08/19/2018   Procedure: A-FLUTTER ABLATION;  Surgeon: Marinus Maw, MD;  Location: [facility];  Service: Cardiovascular;  Laterality: N/A;   ATRIAL FLUTTER ABLATION     BARIATRIC SURGERY  08-13-2012   dental implants  11/16/2009   LARYNX SURGERY     polyp removed   left breast bx was fibrocystic change     left cataract extraction and lens implantation  01/11/2019   ORIF ULNAR FRACTURE Right 02/15/2021   Procedure: OPEN REDUCTION INTERNAL FIXATION (ORIF) ULNAR FRACTURE and distal radius open reduction and internal fixation and repair;  Surgeon: Bradly Bienenstock, MD;  Location: [facility];  Service: Orthopedics;  Laterality: Right;  with IV sedation   Right TKR  August 13, 2006  TOTAL HIP ARTHROPLASTY Right 06/03/2023   Procedure: TOTAL HIP ARTHROPLASTY ANTERIOR APPROACH;  Surgeon: Durene Romans, MD;  Location: [facility];  Service: Orthopedics;  Laterality: Right;   TOTAL KNEE ARTHROPLASTY  11/17/2003   left   TOTAL KNEE ARTHROPLASTY  11/16/2005   right    Family History  Problem Relation Age of Onset   Diabetes Father    Diabetes Brother    Colon cancer Neg Hx    Rectal cancer Neg Hx    Stomach cancer Neg Hx     Social History Social History   Tobacco Use   Smoking status: Former    Current packs/day: 0.00    Types: Cigarettes    Start date: 08/19/1980    Quit date: 08/19/2000    Years since quitting: 22.9   Smokeless tobacco: Never   Vaping Use   Vaping status: Never Used  Substance Use Topics   Alcohol use: No    Comment: Rare   Drug use: No    Current Outpatient Medications  Medication Sig Dispense Refill   amLODipine (NORVASC) 5 MG tablet Take 1 tablet (5 mg total) by mouth daily. 90 tablet 3   Calcium-Vitamin D-Vitamin K (VIACTIV CALCIUM PLUS D) 650-12.5-40 MG-MCG-MCG CHEW Chew 1 tablet by mouth daily.     escitalopram (LEXAPRO) 10 MG tablet Take 10 mg by mouth daily.     ezetimibe (ZETIA) 10 MG tablet Take 10 mg by mouth daily.     ferrous sulfate 325 (65 FE) MG tablet Take 325 mg by mouth every other day.     glucose blood (ONETOUCH VERIO) test strip 1 each by Other route in the morning, at noon, in the evening, and at bedtime. Use as instructed     methocarbamol (ROBAXIN) 500 MG tablet Take 1 tablet (500 mg total) by mouth every 6 (six) hours as needed for muscle spasms. 40 tablet 2   metoprolol succinate (TOPROL-XL) 100 MG 24 hr tablet Take 100 mg by mouth daily.     Multiple Vitamins-Minerals (CENTRUM ADULTS PO) Take 1 tablet by mouth daily.     olmesartan (BENICAR) 40 MG tablet Take 40 mg by mouth daily.     OneTouch Delica Lancets 33G MISC by Does not apply route as directed.     Polyethyl Glycol-Propyl Glycol (SYSTANE OP) Place 1 drop into both eyes as needed (dry eyes).     polyethylene glycol (MIRALAX / GLYCOLAX) 17 g packet Take 17 g by mouth 2 (two) times daily. 14 each 0   simvastatin (ZOCOR) 40 MG tablet Take 40 mg by mouth daily.     TRULICITY 4.5 MG/0.5ML SOPN Inject 4.5 mg into the skin every 7 (seven) days.     No current facility-administered medications for this visit.    Allergies  Allergen Reactions   Orlistat Diarrhea    Other reaction(s): severe diarrhea   Phentermine Hcl     Other reaction(s): elevated BP    Review of Systems  Constitutional:  Positive for activity change. Negative for fatigue.  HENT: Negative.    Eyes: Negative.   Respiratory:  Negative for shortness of  breath.        Sleep apnea  Cardiovascular:  Negative for chest pain and leg swelling.  Gastrointestinal:  Positive for diarrhea.       Reflux  Endocrine: Negative.   Genitourinary: Negative.   Musculoskeletal:  Positive for arthralgias.  Skin: Negative.   Allergic/Immunologic: Negative.   Neurological:  Negative for dizziness and syncope.  Hematological:  Negative.   Psychiatric/Behavioral:         Depression    BP (!) 152/79 (BP Location: Left Arm, Patient Position: Sitting)   Pulse 71   Resp 20   Ht 5\' 5"  (1.651 m)   Wt 238 lb (108 kg)   SpO2 93% Comment: RA  BMI 39.61 kg/m  Physical Exam Constitutional:      Appearance: Normal appearance. She is obese.  HENT:     Head: Normocephalic and atraumatic.  Eyes:     Extraocular Movements: Extraocular movements intact.     Conjunctiva/sclera: Conjunctivae normal.     Pupils: Pupils are equal, round, and reactive to light.  Neck:     Vascular: No carotid bruit.  Cardiovascular:     Rate and Rhythm: Normal rate and regular rhythm.     Pulses: Normal pulses.     Heart sounds: Normal heart sounds. No murmur heard. Pulmonary:     Effort: Pulmonary effort is normal.     Breath sounds: Normal breath sounds.  Musculoskeletal:        General: No swelling.  Skin:    General: Skin is warm and dry.  Neurological:     General: No focal deficit present.     Mental Status: She is alert and oriented to person, place, and time.  Psychiatric:        Mood and Affect: Mood normal.        Behavior: Behavior normal.      Diagnostic Tests:  Narrative & Impression  CLINICAL DATA:  Aortic disorder.  Evaluate thoracic aortic aneurysm.   EXAM: CT ANGIOGRAPHY CHEST WITH CONTRAST   TECHNIQUE: Multidetector CT imaging of the chest was performed using the standard protocol during bolus administration of intravenous contrast. Multiplanar CT image reconstructions and MIPs were obtained to evaluate the vascular anatomy.   RADIATION  DOSE REDUCTION: This exam was performed according to the departmental dose-optimization program which includes automated exposure control, adjustment of the mA and/or kV according to patient size and/or use of iterative reconstruction technique.   CONTRAST:  75mL OMNIPAQUE IOHEXOL 350 MG/ML SOLN   COMPARISON:  Cardiac PET CT-12/08/2022   FINDINGS: Vascular Findings:   Fusiform aneurysmal dilatation of the ascending thoracic aorta with measurements as follows. The thoracic aorta tapers to a normal caliber at the level of the aortic arch. The descending thoracic aorta is mildly ectatic and tortuous.   There is a moderate amount of eccentric mixed calcified and noncalcified atherosclerotic plaque throughout the thoracic aorta, not resulting in a hemodynamically significant stenosis. There is a very tiny/subtle (approximately 1.4 x 0.4 cm) outpouching involving the anterior aspect of the proximal descending thoracic aorta (representative image 115, series 12), likely a contained penetrating atherosclerotic ulcer. No evidence of thoracic aortic dissection or perivascular stranding.   Bovine configuration of the aortic arch. The branch vessels of the aortic arch appear patent throughout their imaged courses.   Cardiomegaly. Coronary artery calcifications. No pericardial effusion.   Although this examination was not tailored for the evaluation the pulmonary arteries, there are no discrete filling defects within the central pulmonary arterial tree to suggest central pulmonary embolism. Borderline enlarged caliber of the main pulmonary artery measuring 32 mm in diameter.   -------------------------------------------------------------   Thoracic aortic measurements:   SINOTUBULAR JUNCTION: 33 mm as measured in greatest oblique short axis coronal dimension.   PROXIMAL ASCENDING THORACIC AORTA: 46 mm as measured in greatest oblique short axis axial dimension (axial image 69, series  8) at the  level of the main pulmonary artery and approximately 46 mm as measured in greatest oblique short axis coronal dimension (coronal image 112, series 11)   AORTIC ARCH: 31 mm as measured in greatest oblique short axis sagittal dimension.   PROXIMAL DESCENDING THORACIC AORTA: 35 mm as measured in greatest oblique short axis axial dimension at the level of the main pulmonary artery.   DISTAL DESCENDING THORACIC AORTA: 32 mm as measured in greatest oblique short axis axial dimension at the level of the diaphragmatic hiatus.   Review of the MIP images confirms the above findings.   -------------------------------------------------------------   Non-Vascular Findings:   Mediastinum/Lymph Nodes: No bulky mediastinal, hilar or axillary lymphadenopathy.   Lungs/Pleura: Advanced centrilobular emphysematous change. Minimal bibasilar ground-glass atelectasis. No discrete focal airspace opacities. No pleural effusion or pneumothorax.   No discrete pulmonary nodules. The central pulmonary airways appear patent.   Upper abdomen: Limited early arterial phase evaluation of the upper abdomen demonstrates marked nodularity of the hepatic contour suggestive of hepatic steatosis. Postoperative change of the gastroesophageal junction and gastric fundus, incompletely imaged. Mild thickening of the bilateral adrenal glands without discrete nodule.   There is an at least 5.2 x 4.9 x 4.7 cm partially exophytic mass arising from the superior pole of the right kidney, incompletely imaged on the present examination. While this mass may contain macroscopic fat suggestive of an AML, it is incompletely characterized and imaged on the present examination.   Precontrast images demonstrate an at least 2.1 cm indeterminate lesion arising from the anterior interpolar aspect of the right kidney (image 160, series 4), also incompletely characterized on the present examination.   Musculoskeletal:  Regional soft tissues appear normal. Normal appearance of the imaged portions of the thyroid gland. No acute or aggressive osseous abnormalities. Stigmata of dish throughout the thoracic spine. Moderate DDD of T2-T3 with grade 1 anterolisthesis at this location.   IMPRESSION: 1. Uncomplicated fusiform aneurysmal dilatation of the ascending thoracic aorta measuring 46 mm in diameter. Recommend semi-annual imaging followup by CTA or MRA and referral to cardiothoracic surgery if not already obtained. This recommendation follows 2010 ACCF/AHA/AATS/ACR/ASA/SCA/SCAI/SIR/STS/SVM Guidelines for the Diagnosis and Management of Patients With Thoracic Aortic Disease. Circulation. 2010; 121: Z610-R604. Aortic aneurysm NOS (ICD10-I71.9) 2. 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 Further evaluation with dedicated renal protocol CT or MRI of the abdomen is recommended. 3. Cardiomegaly with borderline enlarged caliber of the main pulmonary artery, nonspecific though could be seen in the setting of pulmonary arterial hypertension. Further evaluation with cardiac echo could be performed as indicated. 4. Nodularity of the hepatic contour suggestive of hepatic cirrhosis. Correlation with LFTs is advised. 5. Aortic Atherosclerosis (ICD10-I70.0) and Emphysema (ICD10-J43.9).   These results will be called to the ordering clinician or representative by the Radiologist Assistant, and communication documented in the PACS or [employer].     Electronically Signed   By: Simonne Come M.D.   On: 07/15/2023 09:37      Impression:  This 77 year old woman has a 4.6 cm fusiform ascending aortic aneurysm  with her descending aorta at the same level measuring 3.5 cm.  There is a moderate amount of eccentric mixed calcified and noncalcified atherosclerotic plaque throughout the thoracic  aorta.  There is a small outpouching in the anterior aspect of the proximal descending thoracic aorta that could be a penetrating atherosclerotic ulcer but there is no evidence of aortic dissection or perivascular stranding.  Her aneurysm is well below the surgical threshold of 5.5 cm.  I stressed the importance of continued good blood pressure control in preventing further enlargement and acute aortic dissection.  She said that she frequently checks her blood pressure at home and it is usually under good control.  I advised her against doing any heavy lifting or strenuous physical activity that may require a Valsalva maneuver and could suddenly raise her blood pressure to high levels.  I cautioned her against stopping any of her blood pressure medications.  The CT scan also showed a partially exophytic mass arising from the superior pole of the right kidney that was incompletely imaged on the current study.  A dedicated renal CT was recommended and has been ordered but not scheduled yet.   Plan:  I will plan to see her back in 1 year with a CTA of the chest for aortic surveillance.  I spent 45 minutes performing this consultation and > 50% of this time was spent face to face counseling and coordinating the care of this patient's ascending aortic aneurysm.   Alleen Borne, MD [facility] [phone]

## 2023-07-26 ENCOUNTER — Encounter: Payer: Self-pay | Admitting: Internal Medicine

## 2023-07-26 DIAGNOSIS — N2889 Other specified disorders of kidney and ureter: Secondary | ICD-10-CM

## 2023-07-28 ENCOUNTER — Ambulatory Visit (HOSPITAL_COMMUNITY)
Admission: RE | Admit: 2023-07-28 | Discharge: 2023-07-28 | Disposition: A | Discharge status: Home / Self Care | Payer: Medicare Other | Source: Ambulatory Visit | Attending: Internal Medicine | Admitting: Internal Medicine

## 2023-07-28 DIAGNOSIS — N2889 Other specified disorders of kidney and ureter: Secondary | ICD-10-CM

## 2023-07-28 MED ORDER — IOHEXOL 350 MG/ML SOLN
100.0000 mL | Freq: Once | INTRAVENOUS | Status: AC | PRN
  Administered 2023-07-28: 100 mL via INTRAVENOUS

## 2023-08-11 ENCOUNTER — Other Ambulatory Visit: Payer: Self-pay

## 2023-08-11 DIAGNOSIS — K862 Cyst of pancreas: Secondary | ICD-10-CM

## 2023-08-11 NOTE — Telephone Encounter (Signed)
Dr Mayford Knife called the pt and referral placed to [facility].

## 2023-08-12 NOTE — Addendum Note (Signed)
Addended by: Bertram Millard on: 08/12/2023 03:16 AM   Modules accepted: Orders

## 2023-08-17 ENCOUNTER — Other Ambulatory Visit: Payer: Medicare Other

## 2023-08-24 ENCOUNTER — Ambulatory Visit: Payer: Medicare Other

## 2023-08-24 DIAGNOSIS — K862 Cyst of pancreas: Secondary | ICD-10-CM

## 2023-08-25 ENCOUNTER — Encounter: Payer: Self-pay | Admitting: Urology

## 2023-08-25 ENCOUNTER — Ambulatory Visit: Payer: Medicare Other | Admitting: Urology

## 2023-08-25 ENCOUNTER — Encounter (HOSPITAL_BASED_OUTPATIENT_CLINIC_OR_DEPARTMENT_OTHER): Payer: Self-pay | Admitting: Obstetrics & Gynecology

## 2023-08-25 VITALS — BP 123/79 | HR 62 | Ht 65.5 in | Wt 243.0 lb

## 2023-08-25 DIAGNOSIS — N281 Cyst of kidney, acquired: Secondary | ICD-10-CM

## 2023-08-25 DIAGNOSIS — D1771 Benign lipomatous neoplasm of kidney: Secondary | ICD-10-CM | POA: Diagnosis not present

## 2023-08-25 NOTE — Progress Notes (Signed)
Assessment: 1. Renal angiomyolipoma   2. Renal cyst      Plan: Today I reviewed the patient's CT scan with her and discussed with her the findings of a renal angiomyolipoma that is slowly increased over the years and size now currently 4.7 cm.  I also discussed with her her right posterior interpolar complex renal cyst that warrants serial imaging follow-up.  The patient currently has a lot going on in terms of scheduled diagnostic testing and also plans on traveling in November and December.  We discussed the option of selective arterial embolization in some detail today.  She wants to hold off on this until at least after the first of the year.  I plan to see her back in 3 months for follow-up.  Chief Complaint: kidney problem  History of Present Illness:  Veronica Hansen is a 77 y.o. female who is seen in consultation from Garlan Fillers, MD for evaluation of renal mass.  Patient has an incidentally noted 4.7 cm right anterior polar to upper pole or exophytic lesion consistent with AML.  This lesion has been present for many years and was noted on abdominal ultrasound done in 09-25-11 at which time it measured 3 cm.  Patient has no GU symptoms.  Also noted on the recent CT scan with a 2.5 cm lesion in the posterior interpolar right kidney which is minimally complex and warrants serial imaging follow-up.   Past Medical History:  Past Medical History:  Diagnosis Date   A-fib (HCC)    A-fib (HCC) 07/2018   treated with ablation procedure in 09/24/2018  Allergic rhinitis    Anemia    Arthritis    joints hurt   ASCVD (arteriosclerotic cardiovascular disease)    Atypical chest pain    cardiology consult showed an echocardiogram with left ventricular hypertrophy normal left ventricular function.   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   COPD (chronic obstructive pulmonary disease) (HCC)    Depression    husband died of Covid September 24, 2020   Diabetes mellitus    type 2, no longer takes  medication    Diabetes mellitus (HCC)    Dyspnea    GERD (gastroesophageal reflux disease)    History of blood transfusion    HTN (hypertension)    Hypercholesterolemia    Hyperlipidemia    Hypertension    Incontinence    Iron deficiency anemia    Lower esophageal sphincter, relaxation    Morbid obesity (HCC)    OSA (obstructive sleep apnea)    OSA on CPAP 03/07/2014   OSA on CPAP    Proteinuria 2001   Psoriasis    RLL pneumonia 06/2016   Runny nose    seasonal - poss allergies   Sinus problem    SOB (shortness of breath)    Tobacco use    Stopped in 09-25-2003    Past Surgical History:  Past Surgical History:  Procedure Laterality Date   A-FLUTTER ABLATION N/A 08/19/2018   Procedure: A-FLUTTER ABLATION;  Surgeon: Marinus Maw, MD;  Location: [facility];  Service: Cardiovascular;  Laterality: N/A;   ATRIAL FLUTTER ABLATION     BARIATRIC SURGERY  09/24/12   dental implants  11/16/2009   LARYNX SURGERY     polyp removed   left breast bx was fibrocystic change     left cataract extraction and lens implantation  01/11/2019   ORIF ULNAR FRACTURE Right 02/15/2021   Procedure: OPEN REDUCTION INTERNAL  FIXATION (ORIF) ULNAR FRACTURE and distal radius open reduction and internal fixation and repair;  Surgeon: Bradly Bienenstock, MD;  Location: [facility];  Service: Orthopedics;  Laterality: Right;  with IV sedation   Right TKR  2007   TOTAL HIP ARTHROPLASTY Right 06/03/2023   Procedure: TOTAL HIP ARTHROPLASTY ANTERIOR APPROACH;  Surgeon: Durene Romans, MD;  Location: [facility];  Service: Orthopedics;  Laterality: Right;   TOTAL KNEE ARTHROPLASTY  11/17/2003   left   TOTAL KNEE ARTHROPLASTY  11/16/2005   right    Allergies:  Allergies  Allergen Reactions   Orlistat Diarrhea    Other reaction(s): severe diarrhea   Phentermine Hcl     Other reaction(s): elevated BP    Family History:  Family History  Problem Relation Age of Onset   Diabetes Father    Diabetes Brother    Colon  cancer Neg Hx    Rectal cancer Neg Hx    Stomach cancer Neg Hx     Social History:  Social History   Tobacco Use   Smoking status: Former    Current packs/day: 0.00    Types: Cigarettes    Start date: 08/19/1980    Quit date: 08/19/2000    Years since quitting: 23.0   Smokeless tobacco: Never  Vaping Use   Vaping status: Never Used  Substance Use Topics   Alcohol use: No    Comment: Rare   Drug use: No    Review of symptoms:  Constitutional:  Negative for unexplained weight loss, night sweats, fever, chills ENT:  Negative for nose bleeds, sinus pain, painful swallowing CV:  Negative for chest pain, shortness of breath, exercise intolerance, palpitations, loss of consciousness Resp:  Negative for cough, wheezing, shortness of breath GI:  Negative for nausea, vomiting, diarrhea, bloody stools GU:  Positives noted in HPI; otherwise negative for gross hematuria, dysuria, urinary incontinence Neuro:  Negative for seizures, poor balance, limb weakness, slurred speech Psych:  Negative for lack of energy, depression, anxiety Endocrine:  Negative for polydipsia, polyuria, symptoms of hypoglycemia (dizziness, hunger, sweating) Hematologic:  Negative for anemia, purpura, petechia, prolonged or excessive bleeding, use of anticoagulants  Allergic:  Negative for difficulty breathing or choking as a result of exposure to anything; no shellfish allergy; no allergic response (rash/itch) to materials, foods  Physical exam: BP 123/79   Pulse 62   Ht 5' 5.5" (1.664 m)   Wt 243 lb (110.2 kg)   BMI 39.82 kg/m  GENERAL APPEARANCE:  Well appearing, well developed, well nourished, NAD   Results: UA is negative   Review of imaging studies CT 07/2023---- The dominant inter/upper pole left renal lesion measures 4.7 cm and demonstrates precontrast macroscopic fat including on 19/3, consistent with a benign angiomyolipoma. AML was also noted in the right kidney on ultrasound done in 2012 at  which time it measured approximately 3 cm.   Bilateral simple renal cysts including at up to 3.7 cm in the interpolar left kidney. Posterior interpolar right renal 2.5 cm lesion is consistent with a minimally complex cyst including on 49/5.   A 3 mm precontrast hyperattenuating lesion including on 36/3 and 58/6 in the lower pole right kidney is too small to entirely characterize. Other bilateral too small to characterize renal lesions. No hydronephrosis.

## 2023-08-26 LAB — URINALYSIS, ROUTINE W REFLEX MICROSCOPIC
Bilirubin, UA: NEGATIVE
Glucose, UA: NEGATIVE
Ketones, UA: NEGATIVE
Leukocytes,UA: NEGATIVE
Nitrite, UA: NEGATIVE
Protein,UA: NEGATIVE
RBC, UA: NEGATIVE
Specific Gravity, UA: 1.015 (ref 1.005–1.030)
Urobilinogen, Ur: 0.2 mg/dL (ref 0.2–1.0)
pH, UA: 6 (ref 5.0–7.5)

## 2023-09-02 LAB — PANCREATIC ELASTASE, FECAL: Pancreatic Elastase-1, Stool: >500 ug/g

## 2023-09-04 ENCOUNTER — Other Ambulatory Visit: Payer: Self-pay | Admitting: Internal Medicine

## 2023-09-06 ENCOUNTER — Ambulatory Visit (HOSPITAL_BASED_OUTPATIENT_CLINIC_OR_DEPARTMENT_OTHER): Payer: Medicare Other | Admitting: Obstetrics & Gynecology

## 2023-09-06 ENCOUNTER — Other Ambulatory Visit (HOSPITAL_COMMUNITY)
Admission: RE | Admit: 2023-09-06 | Discharge: 2023-09-06 | Disposition: A | Discharge status: Home / Self Care | Payer: Medicare Other | Source: Ambulatory Visit | Attending: Obstetrics & Gynecology | Admitting: Obstetrics & Gynecology

## 2023-09-06 ENCOUNTER — Encounter (HOSPITAL_BASED_OUTPATIENT_CLINIC_OR_DEPARTMENT_OTHER): Payer: Self-pay | Admitting: Obstetrics & Gynecology

## 2023-09-06 VITALS — BP 121/55 | HR 73 | Ht 65.5 in | Wt 242.6 lb

## 2023-09-06 DIAGNOSIS — Z7689 Persons encountering health services in other specified circumstances: Secondary | ICD-10-CM | POA: Diagnosis not present

## 2023-09-06 DIAGNOSIS — Z124 Encounter for screening for malignant neoplasm of cervix: Secondary | ICD-10-CM | POA: Insufficient documentation

## 2023-09-06 DIAGNOSIS — Z780 Asymptomatic menopausal state: Secondary | ICD-10-CM | POA: Diagnosis not present

## 2023-09-06 DIAGNOSIS — Z1231 Encounter for screening mammogram for malignant neoplasm of breast: Secondary | ICD-10-CM | POA: Diagnosis not present

## 2023-09-06 NOTE — Progress Notes (Signed)
77 y.o.  Widowed White or Caucasian female here for new patient exam.  She is a former patient.  Since I've seen her, she had bariatric surgery.  She is down around 120 pounds since I saw her last.  Last hba1c was 5.2.  Denies vaginal bleeding.    Once of her daughters is BRCA positive and this came from her husband's side.    No LMP recorded. Patient is postmenopausal.          Sexually active: No.  The current method of family planning is post menopausal status.    Smoker:  no  Health Maintenance: Pap:  has been more than 10 years since last one History of abnormal Pap:  no MMG:  09-15-22 Colonoscopy:  Sep 15, 2022 with Dr. Chales Abrahams BMD:   discussed today.  Will wait until she has several upcoming tests completed Screening Labs: done with Jarold Motto   reports that she quit smoking about 23 years ago. Her smoking use included cigarettes. She started smoking about 43 years ago. She has never used smokeless tobacco. She reports that she does not drink alcohol and does not use drugs.  Past Medical History:  Diagnosis Date   A-fib (HCC)    A-fib (HCC) 07/2018   treated with ablation procedure in 09-15-2018  Allergic rhinitis    Anemia    Arthritis    joints hurt   ASCVD (arteriosclerotic cardiovascular disease)    Atypical chest pain    cardiology consult showed an echocardiogram with left ventricular hypertrophy normal left ventricular function.   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   COPD (chronic obstructive pulmonary disease) (HCC)    Depression    husband died of Covid 09/15/20   Diabetes mellitus    type 2, no longer takes medication    Diabetes mellitus (HCC)    Dyspnea    GERD (gastroesophageal reflux disease)    History of blood transfusion    HTN (hypertension)    Hypercholesterolemia    Hyperlipidemia    Hypertension    Incontinence    Iron deficiency anemia    Lower esophageal sphincter, relaxation    Morbid obesity (HCC)    OSA (obstructive sleep apnea)    OSA on CPAP  03/07/2014   OSA on CPAP    Proteinuria 2001   Psoriasis    RLL pneumonia 06/2016   Runny nose    seasonal - poss allergies   Sinus problem    SOB (shortness of breath)    Tobacco use    Stopped in 16-Sep-2003    Past Surgical History:  Procedure Laterality Date   A-FLUTTER ABLATION N/A 08/19/2018   Procedure: A-FLUTTER ABLATION;  Surgeon: Marinus Maw, MD;  Location: [facility];  Service: Cardiovascular;  Laterality: N/A;   ATRIAL FLUTTER ABLATION     BARIATRIC SURGERY  September 15, 2012   dental implants  11/16/2009   LARYNX SURGERY     polyp removed   left breast bx was fibrocystic change     left cataract extraction and lens implantation  01/11/2019   ORIF ULNAR FRACTURE Right 02/15/2021   Procedure: OPEN REDUCTION INTERNAL FIXATION (ORIF) ULNAR FRACTURE and distal radius open reduction and internal fixation and repair;  Surgeon: Bradly Bienenstock, MD;  Location: [facility];  Service: Orthopedics;  Laterality: Right;  with IV sedation   Right TKR  2006-09-15   TOTAL HIP ARTHROPLASTY Right 06/03/2023   Procedure: TOTAL HIP ARTHROPLASTY ANTERIOR APPROACH;  Surgeon: Durene Romans, MD;  Location: [facility];  Service: Orthopedics;  Laterality: Right;   TOTAL KNEE ARTHROPLASTY  11/17/2003   left   TOTAL KNEE ARTHROPLASTY  11/16/2005   right    Current Outpatient Medications  Medication Sig Dispense Refill   amLODipine (NORVASC) 5 MG tablet Take 1 tablet (5 mg total) by mouth daily. 90 tablet 3   Calcium-Vitamin D-Vitamin K (VIACTIV CALCIUM PLUS D) 650-12.5-40 MG-MCG-MCG CHEW Chew 1 tablet by mouth daily.     escitalopram (LEXAPRO) 10 MG tablet Take 10 mg by mouth daily.     ezetimibe (ZETIA) 10 MG tablet Take 10 mg by mouth daily.     ferrous sulfate 325 (65 FE) MG tablet Take 325 mg by mouth every other day.     glucose blood (ONETOUCH VERIO) test strip 1 each by Other route in the morning, at noon, in the evening, and at bedtime. Use as instructed     methocarbamol (ROBAXIN) 500 MG tablet Take 1  tablet (500 mg total) by mouth every 6 (six) hours as needed for muscle spasms. 40 tablet 2   metoprolol succinate (TOPROL-XL) 100 MG 24 hr tablet Take 100 mg by mouth daily.     Multiple Vitamins-Minerals (CENTRUM ADULTS PO) Take 1 tablet by mouth daily.     olmesartan (BENICAR) 40 MG tablet Take 40 mg by mouth daily.     OneTouch Delica Lancets 33G MISC by Does not apply route as directed.     Polyethyl Glycol-Propyl Glycol (SYSTANE OP) Place 1 drop into both eyes as needed (dry eyes).     simvastatin (ZOCOR) 40 MG tablet Take 40 mg by mouth daily.     TRULICITY 4.5 MG/0.5ML SOPN Inject 4.5 mg into the skin every 7 (seven) days.     No current facility-administered medications for this visit.    Family History  Problem Relation Age of Onset   Diabetes Father    Diabetes Brother    Colon cancer Neg Hx    Rectal cancer Neg Hx    Stomach cancer Neg Hx     ROS: Constitutional: negative Genitourinary:negative  Exam:   BP (!) 121/55 (BP Location: Right Arm, Patient Position: Sitting, Cuff Size: Large)   Pulse 73   Ht 5' 5.5" (1.664 m)   Wt 242 lb 9.6 oz (110 kg)   BMI 39.76 kg/m   Height: 5' 5.5" (166.4 cm)  General appearance: alert, cooperative and appears stated age Head: Normocephalic, without obvious abnormality, atraumatic Neck: no adenopathy, supple, symmetrical, trachea midline and thyroid normal to inspection and palpation Lungs: clear to auscultation bilaterally Breasts: normal appearance, no masses or tenderness Heart: regular rate and rhythm Abdomen: soft, non-tender; bowel sounds normal; no masses,  no organomegaly Extremities: extremities normal, atraumatic, no cyanosis or edema Skin: Skin color, texture, turgor normal. No rashes or lesions Lymph nodes: Cervical, supraclavicular, and axillary nodes normal. No abnormal inguinal nodes palpated Neurologic: Grossly normal   Pelvic: External genitalia:  no lesions              Urethra:  normal appearing urethra  with no masses, tenderness or lesions              Bartholins and Skenes: normal                 Vagina: normal appearing vagina with normal color and no discharge, no lesions              Cervix: no lesions  Pap taken: Yes.   Bimanual Exam:  Uterus:  normal size, contour, position, consistency, mobility, non-tender              Adnexa: normal adnexa and no mass, fullness, tenderness               Rectovaginal: Confirms               Anus:  normal sphincter tone, no lesions  Chaperone, Ina Homes, CMA, was present for exam.  Assessment/Plan: 1. Postmenopausal - Pap smear updated today - Mammogram 08/2022 - Colonoscopy 2023.  Needs new order. - Bone mineral density done with Dr. Eloise Harman - lab work done with PCP, Dr. Eloise Harman - vaccines reviewed/updated  2. Encounter for screening mammogram for malignant neoplasm of breast - MM 3D SCREENING MAMMOGRAM BILATERAL BREAST; Future  3. Cervical cancer screening - Cytology - PAP( [place])

## 2023-09-07 ENCOUNTER — Ambulatory Visit (HOSPITAL_COMMUNITY)
Admission: RE | Admit: 2023-09-07 | Discharge: 2023-09-07 | Disposition: A | Discharge status: Home / Self Care | Payer: Medicare Other | Source: Ambulatory Visit | Attending: Gastroenterology | Admitting: Gastroenterology

## 2023-09-07 ENCOUNTER — Other Ambulatory Visit: Payer: Self-pay | Admitting: Gastroenterology

## 2023-09-07 DIAGNOSIS — K862 Cyst of pancreas: Secondary | ICD-10-CM | POA: Diagnosis present

## 2023-09-07 MED ORDER — GADOBUTROL 1 MMOL/ML IV SOLN
10.0000 mL | Freq: Once | INTRAVENOUS | Status: AC | PRN
  Administered 2023-09-07: 10 mL via INTRAVENOUS

## 2023-09-09 LAB — CYTOLOGY - PAP: Diagnosis: NEGATIVE

## 2023-09-15 ENCOUNTER — Encounter: Payer: Self-pay | Admitting: Gastroenterology

## 2023-09-15 ENCOUNTER — Other Ambulatory Visit (INDEPENDENT_AMBULATORY_CARE_PROVIDER_SITE_OTHER): Payer: Medicare Other

## 2023-09-15 ENCOUNTER — Ambulatory Visit: Payer: Medicare Other | Admitting: Gastroenterology

## 2023-09-15 VITALS — BP 120/90 | HR 65 | Ht 66.0 in | Wt 246.0 lb

## 2023-09-15 DIAGNOSIS — D509 Iron deficiency anemia, unspecified: Secondary | ICD-10-CM

## 2023-09-15 DIAGNOSIS — K8020 Calculus of gallbladder without cholecystitis without obstruction: Secondary | ICD-10-CM

## 2023-09-15 DIAGNOSIS — N2889 Other specified disorders of kidney and ureter: Secondary | ICD-10-CM | POA: Diagnosis not present

## 2023-09-15 DIAGNOSIS — K862 Cyst of pancreas: Secondary | ICD-10-CM

## 2023-09-15 LAB — CBC WITH DIFFERENTIAL/PLATELET
Basophils Absolute: 0.1 10*3/uL (ref 0.0–0.1)
Basophils Relative: 0.7 % (ref 0.0–3.0)
Eosinophils Absolute: 0.2 10*3/uL (ref 0.0–0.7)
Eosinophils Relative: 2.1 % (ref 0.0–5.0)
HCT: 36 % (ref 36.0–46.0)
Hemoglobin: 11.1 g/dL — ABNORMAL LOW (ref 12.0–15.0)
Lymphocytes Relative: 26 % (ref 12.0–46.0)
Lymphs Abs: 2.3 10*3/uL (ref 0.7–4.0)
MCHC: 30.9 g/dL (ref 30.0–36.0)
MCV: 70.4 fL — ABNORMAL LOW (ref 78.0–100.0)
Monocytes Absolute: 0.6 10*3/uL (ref 0.1–1.0)
Monocytes Relative: 6.6 % (ref 3.0–12.0)
Neutro Abs: 5.8 10*3/uL (ref 1.4–7.7)
Neutrophils Relative %: 64.6 % (ref 43.0–77.0)
Platelets: 305 10*3/uL (ref 150.0–400.0)
RBC: 5.11 Mil/uL (ref 3.87–5.11)
RDW: 17 % — ABNORMAL HIGH (ref 11.5–15.5)
WBC: 9 10*3/uL (ref 4.0–10.5)

## 2023-09-15 LAB — COMPREHENSIVE METABOLIC PANEL
ALT: 20 U/L (ref 0–35)
AST: 26 U/L (ref 0–37)
Albumin: 4.2 g/dL (ref 3.5–5.2)
Alkaline Phosphatase: 78 U/L (ref 39–117)
BUN: 10 mg/dL (ref 6–23)
CO2: 31 meq/L (ref 19–32)
Calcium: 9.9 mg/dL (ref 8.4–10.5)
Chloride: 102 meq/L (ref 96–112)
Creatinine, Ser: 0.55 mg/dL (ref 0.40–1.20)
GFR: 89.77 mL/min (ref >60.00)
Glucose, Bld: 81 mg/dL (ref 70–99)
Potassium: 4.2 meq/L (ref 3.5–5.1)
Sodium: 138 meq/L (ref 135–145)
Total Bilirubin: 0.6 mg/dL (ref 0.2–1.2)
Total Protein: 7.6 g/dL (ref 6.0–8.3)

## 2023-09-15 LAB — PROTIME-INR
INR: 1.1 {ratio} — ABNORMAL HIGH (ref 0.8–1.0)
Prothrombin Time: 11.4 s (ref 9.6–13.1)

## 2023-09-15 LAB — LIPASE: Lipase: 40 U/L (ref 11.0–59.0)

## 2023-09-15 NOTE — Progress Notes (Signed)
Chief Complaint: Abn MRCP  Referring Provider:  Garlan Fillers, MD      ASSESSMENT AND PLAN;   #1. 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 No evidence of exocrine insufficiency.  Radiology recommends EUS/FNA.  #2. Renal mass on MRI-being followed by urology  #3. Asymptomtic cholelithiasis/hepatomegaly without any obvious cirrhosis.  #4. IDA. Neg colon 05/2022. S/P R Hip replacement   Plan: -CBC, CMP, lipase, CA 19-9, CEA, PT INR  -Will discuss with Dr Meridee Score regarding EUA/FNA (feb 09-26-24) -Rpt MRI with MRCP (with contrast) in 93yr (oct 2025) -Given MRI report to pt. -FU thereafter.   HPI:    Veronica Hansen is a 77 y.o. female  A Fib s/p ablation (off AC), COPD (not on home O2), diet-controlled DM 2, HTN, HLD, OSA on CPAP, history of colonic polyps 09/26/12, IDA (followed by hematology, on iron supplements), S/P gastric stapling 09-26-2012  Denies having any significant GI problems except for occasional diarrhea with fecal incontinence as before.  And negative colonoscopy 05/2022 as below.  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  With associated calcifications consistent with chronic pancreatitis.  Radiology recommends EUS/FNA, and repeat MRCP in 1 year -Has cholelithiasis -Hepatomegaly.  No definite cirrhosis or portal hypertension on MRI (CTA ?early cirrhosis)   Stool studies including fecal elastase were negative  Underwent recent right hip replacement Did have microcytic anemia with hemoglobin 11 to 9.8 (after Sx), MCV 80.   No melena or  hematochezia. She denies having any abdominal pain, jaundice dark urine or pale stools,  No previous history of pancreatitis.  No history of significant alcohol use.  No family history of pancreatic CA.   Previous GI procedures: Colon 05/2022 - One 6 mm polyp in the proximal ascending colon, removed with a cold snare. Resected and retrieved. - Minimal sigmoid diverticulosis. - Non-bleeding internal hemorrhoids. -Bx- TA.  No need to repeat due to age -Negative random colon biopsies for microscopic colitis.  Past Medical History:  Diagnosis Date   A-fib (HCC)    A-fib (HCC) 07/2018   treated with ablation procedure in October 2019   Allergic rhinitis    Anemia    Arthritis    joints hurt   ASCVD (arteriosclerotic cardiovascular disease)    Atypical chest pain    cardiology consult showed an echocardiogram with left ventricular hypertrophy normal left ventricular function.   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   COPD (chronic obstructive pulmonary disease) (HCC)    Depression    husband died of Covid 2020/09/26   Diabetes mellitus    type 2, no longer takes medication    Diabetes mellitus (HCC)    Dyspnea    GERD (gastroesophageal reflux disease)    History of blood transfusion    HTN (hypertension)    Hypercholesterolemia    Hyperlipidemia    Hypertension    Incontinence    Iron deficiency anemia    Lower esophageal sphincter, relaxation    Morbid obesity (HCC)    OSA (obstructive sleep apnea)    OSA on CPAP 03/07/2014   OSA on CPAP  Proteinuria 2001   Psoriasis    RLL pneumonia 06/2016   Runny nose    seasonal - poss allergies   Sinus problem    SOB (shortness of breath)    Tobacco use    Stopped in 2004    Past Surgical History:  Procedure Laterality Date   A-FLUTTER ABLATION N/A 08/19/2018   Procedure: A-FLUTTER ABLATION;  Surgeon: Marinus Maw, MD;  Location: [facility];  Service: Cardiovascular;  Laterality: N/A;   ATRIAL FLUTTER ABLATION      BARIATRIC SURGERY  2013   dental implants  11/16/2009   LARYNX SURGERY     polyp removed   left breast bx was fibrocystic change     left cataract extraction and lens implantation  01/11/2019   ORIF ULNAR FRACTURE Right 02/15/2021   Procedure: OPEN REDUCTION INTERNAL FIXATION (ORIF) ULNAR FRACTURE and distal radius open reduction and internal fixation and repair;  Surgeon: Bradly Bienenstock, MD;  Location: [facility];  Service: Orthopedics;  Laterality: Right;  with IV sedation   Right TKR  2007   TOTAL HIP ARTHROPLASTY Right 06/03/2023   Procedure: TOTAL HIP ARTHROPLASTY ANTERIOR APPROACH;  Surgeon: Durene Romans, MD;  Location: [facility];  Service: Orthopedics;  Laterality: Right;   TOTAL KNEE ARTHROPLASTY  11/17/2003   left   TOTAL KNEE ARTHROPLASTY  11/16/2005   right    Family History  Problem Relation Age of Onset   Diabetes Father    Diabetes Brother    Colon cancer Neg Hx    Rectal cancer Neg Hx    Stomach cancer Neg Hx     Social History   Tobacco Use   Smoking status: Former    Current packs/day: 0.00    Types: Cigarettes    Start date: 08/19/1980    Quit date: 08/19/2000    Years since quitting: 23.0   Smokeless tobacco: Never  Vaping Use   Vaping status: Never Used  Substance Use Topics   Alcohol use: No    Comment: Rare   Drug use: No    Current Outpatient Medications  Medication Sig Dispense Refill   amLODipine (NORVASC) 5 MG tablet TAKE 1 TABLET BY MOUTH DAILY 90 tablet 3   Calcium-Vitamin D-Vitamin K (VIACTIV CALCIUM PLUS D) 650-12.5-40 MG-MCG-MCG CHEW Chew 1 tablet by mouth daily.     escitalopram (LEXAPRO) 10 MG tablet Take 10 mg by mouth daily.     ezetimibe (ZETIA) 10 MG tablet Take 10 mg by mouth daily.     ferrous sulfate 325 (65 FE) MG tablet Take 325 mg by mouth every other day.     glucose blood (ONETOUCH VERIO) test strip 1 each by Other route in the morning, at noon, in the evening, and at bedtime. Use as instructed     methocarbamol (ROBAXIN) 500 MG  tablet Take 1 tablet (500 mg total) by mouth every 6 (six) hours as needed for muscle spasms. 40 tablet 2   metoprolol succinate (TOPROL-XL) 100 MG 24 hr tablet Take 100 mg by mouth daily.     Multiple Vitamins-Minerals (CENTRUM ADULTS PO) Take 1 tablet by mouth daily.     olmesartan (BENICAR) 40 MG tablet Take 40 mg by mouth daily.     OneTouch Delica Lancets 33G MISC by Does not apply route as directed.     Polyethyl Glycol-Propyl Glycol (SYSTANE OP) Place 1 drop into both eyes as needed (dry eyes).     simvastatin (ZOCOR) 40 MG tablet Take 40 mg  by mouth daily.     TRULICITY 4.5 MG/0.5ML SOPN Inject 4.5 mg into the skin every 7 (seven) days.     No current facility-administered medications for this visit.    Allergies  Allergen Reactions   Orlistat Diarrhea    Other reaction(s): severe diarrhea   Phentermine Hcl     Other reaction(s): elevated BP    Review of Systems:  Constitutional: Denies fever, chills, diaphoresis, appetite change and fatigue.  HEENT: Denies photophobia, eye pain, redness, hearing loss, ear pain, congestion, sore throat, rhinorrhea, sneezing, mouth sores, neck pain, neck stiffness and tinnitus.   Respiratory: Denies SOB, DOE, cough, chest tightness,  and wheezing.   Cardiovascular: Denies chest pain, palpitations and leg swelling.  Genitourinary: Denies dysuria, urgency, frequency, hematuria, flank pain and difficulty urinating.  Musculoskeletal: Denies myalgias, back pain, joint swelling, arthralgias and gait problem.  Skin: No rash.  Neurological: Denies dizziness, seizures, syncope, weakness, light-headedness, numbness and headaches.  Hematological: Denies adenopathy. Easy bruising, personal or family bleeding history  Psychiatric/Behavioral: No anxiety or depression     Physical Exam:    BP (!) 120/90   Pulse 65   Ht 5\' 6"  (1.676 m)   Wt 246 lb (111.6 kg)   BMI 39.71 kg/m  Wt Readings from Last 3 Encounters:  09/15/23 246 lb (111.6 kg)  09/06/23  242 lb 9.6 oz (110 kg)  08/25/23 243 lb (110.2 kg)   Constitutional:  Well-developed, in no acute distress. Psychiatric: Normal mood and affect. Behavior is normal. HEENT: Pupils normal.  Conjunctivae are normal. No scleral icterus. Neck supple.  Cardiovascular: Normal rate, regular rhythm. No edema Pulmonary/chest: Effort normal and breath sounds normal. No wheezing, rales or rhonchi. Abdominal: Soft, nondistended. Nontender. Bowel sounds active throughout. There are no masses palpable. No hepatomegaly. Rectal: Deferred Neurological: Alert and oriented to person place and time. Skin: Skin is warm and dry. No rashes noted.  Data Reviewed: I have personally reviewed following labs and imaging studies  CBC:    Latest Ref Rng & Units 06/04/2023    3:56 AM 05/25/2023    1:37 PM 07/24/2021    1:01 PM  CBC  WBC 4.0 - 10.5 K/uL 12.1  9.9  9.6   Hemoglobin 12.0 - 15.0 g/dL 9.8  02.7  25.3   Hematocrit 36.0 - 46.0 % 32.4  37.9  39.8   Platelets 150 - 400 K/uL 209  252  260     CMP:    Latest Ref Rng & Units 07/14/2023   12:06 PM 06/04/2023    3:56 AM 05/25/2023    1:37 PM  CMP  Glucose 70 - 99 mg/dL  664  82   BUN 8 - 23 mg/dL  14  11   Creatinine 4.03 - 1.00 mg/dL 4.74  2.59  5.63   Sodium 135 - 145 mmol/L  135  135   Potassium 3.5 - 5.1 mmol/L  4.3  4.3   Chloride 98 - 111 mmol/L  104  103   CO2 22 - 32 mmol/L  23  24   Calcium 8.9 - 10.3 mg/dL  8.9  9.2     GFR: CrCl cannot be calculated (Patient's most recent lab result is older than the maximum 21 days allowed.). Liver Function Tests: No results for input(s): "AST", "ALT", "ALKPHOS", "BILITOT", "PROT", "ALBUMIN" in the last 168 hours. No results for input(s): "LIPASE", "AMYLASE" in the last 168 hours. No results for input(s): "AMMONIA" in the last 168 hours. Coagulation Profile: No  results for input(s): "INR", "PROTIME" in the last 168 hours. HbA1C: No results for input(s): "HGBA1C" in the last 72 hours. Lipid Profile: No  results for input(s): "CHOL", "HDL", "LDLCALC", "TRIG", "CHOLHDL", "LDLDIRECT" in the last 72 hours. Thyroid Function Tests: No results for input(s): "TSH", "T4TOTAL", "FREET4", "T3FREE", "THYROIDAB" in the last 72 hours. Anemia Panel: No results for input(s): "VITAMINB12", "FOLATE", "FERRITIN", "TIBC", "IRON", "RETICCTPCT" in the last 72 hours.  No results found for this or any previous visit (from the past 240 hour(s)).    Radiology Studies: MR ABDOMEN MRCP W WO CONTAST  Result Date: 09/10/2023 CLINICAL DATA:  Characterize pancreatic cystic lesions incidentally identified by abdominal CT EXAM: MRI ABDOMEN WITHOUT AND WITH CONTRAST (INCLUDING MRCP) TECHNIQUE: Multiplanar multisequence MR imaging of the abdomen was performed both before and after the administration of intravenous contrast. Heavily T2-weighted images of the biliary and pancreatic ducts were obtained, and three-dimensional MRCP images were rendered by post processing. CONTRAST:  10mL GADAVIST GADOBUTROL 1 MMOL/ML IV SOLN COMPARISON:  CT abdomen, 07/28/2023 FINDINGS: Lower chest: No acute abnormality.  Small hiatal hernia. Hepatobiliary: No solid liver abnormality is seen. Hepatomegaly, maximum coronal span 21.4 cm large gallstone. No gallbladder wall thickening, or biliary dilatation. Pancreas: Fluid signal cystic lesion of the tip of the pancreatic tail with a rim of enhancing tissue and thick internal septations measuring 2.6 x 2.2 cm (series 5, image 25). No pancreatic ductal dilatation or surrounding inflammatory changes. Spleen: Normal in size without significant abnormality. Adrenals/Urinary Tract: Adrenal glands are unremarkable. Large, exophytic mass arising from the peripheral superior pole of the right kidney measuring 5.1 x 5.2 cm (series 5, image 15). This contains a significant quantity of macroscopic fat on in and opposed phase imaging, however is significantly vascular, with substantial contrast enhancing tissue (series 20,  image 25). Multiple additional simple and hemorrhagic or proteinaceous renal cortical cysts, for which no further follow-up or characterization is required. Stomach/Bowel: Stomach is within normal limits. No evidence of bowel wall thickening, distention, or inflammatory changes. Vascular/Lymphatic: No significant vascular findings are present. No enlarged abdominal lymph nodes. Other: No abdominal wall hernia or abnormality. No ascites. Musculoskeletal: No acute or significant osseous findings. IMPRESSION: 1. Large, exophytic mass arising from the peripheral superior pole of the right kidney measuring 5.1 x 5.2 cm. This contains a significant quantity of macroscopic fat on in and opposed phase imaging, however is significantly vascular, with substantial contrast enhancing tissue. 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 Recommend surgical consultation. At minimum, recommend continued imaging surveillance to ensure ongoing stability. 2. Fluid signal cystic lesion of the tip of the pancreatic tail with a rim of enhancing tissue and thick internal septations measuring 2.6 x 2.2 cm. No pancreatic ductal dilatation or surrounding inflammatory changes. Given calcific stigmata of chronic pancreatitis identified by prior CT, this is most likely a complex pseudocyst, however given size and complexity, recommend GI referral for consideration of EUS/FNA for definitive characterization, as well as imaging surveillance at 1 year 3. Cholelithiasis. 4. Hepatomegaly. Electronically Signed   By: Jearld Lesch M.D.   On: 09/10/2023 13:03   MR 3D Recon At Scanner  Result Date: 09/10/2023 CLINICAL DATA:  Characterize pancreatic cystic lesions incidentally identified by abdominal CT EXAM: MRI ABDOMEN WITHOUT AND WITH CONTRAST (INCLUDING MRCP) TECHNIQUE: Multiplanar multisequence  MR imaging of the abdomen was performed both before and after the administration of intravenous  contrast. Heavily T2-weighted images of the biliary and pancreatic ducts were obtained, and three-dimensional MRCP images were rendered by post processing. CONTRAST:  10mL GADAVIST GADOBUTROL 1 MMOL/ML IV SOLN COMPARISON:  CT abdomen, 07/28/2023 FINDINGS: Lower chest: No acute abnormality.  Small hiatal hernia. Hepatobiliary: No solid liver abnormality is seen. Hepatomegaly, maximum coronal span 21.4 cm large gallstone. No gallbladder wall thickening, or biliary dilatation. Pancreas: Fluid signal cystic lesion of the tip of the pancreatic tail with a rim of enhancing tissue and thick internal septations measuring 2.6 x 2.2 cm (series 5, image 25). No pancreatic ductal dilatation or surrounding inflammatory changes. Spleen: Normal in size without significant abnormality. Adrenals/Urinary Tract: Adrenal glands are unremarkable. Large, exophytic mass arising from the peripheral superior pole of the right kidney measuring 5.1 x 5.2 cm (series 5, image 15). This contains a significant quantity of macroscopic fat on in and opposed phase imaging, however is significantly vascular, with substantial contrast enhancing tissue (series 20, image 25). Multiple additional simple and hemorrhagic or proteinaceous renal cortical cysts, for which no further follow-up or characterization is required. Stomach/Bowel: Stomach is within normal limits. No evidence of bowel wall thickening, distention, or inflammatory changes. Vascular/Lymphatic: No significant vascular findings are present. No enlarged abdominal lymph nodes. Other: No abdominal wall hernia or abnormality. No ascites. Musculoskeletal: No acute or significant osseous findings. IMPRESSION: 1. Large, exophytic mass arising from the peripheral superior pole of the right kidney measuring 5.1 x 5.2 cm. This contains a significant quantity of macroscopic fat on in and opposed phase  imaging, however is significantly vascular, with substantial contrast enhancing tissue. 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 Recommend surgical consultation. At minimum, recommend continued imaging surveillance to ensure ongoing stability. 2. Fluid signal cystic lesion of the tip of the pancreatic tail with a rim of enhancing tissue and thick internal septations measuring 2.6 x 2.2 cm. No pancreatic ductal dilatation or surrounding inflammatory changes. Given calcific stigmata of chronic pancreatitis identified by prior CT, this is most likely a complex pseudocyst, however given size and complexity, recommend GI referral for consideration of EUS/FNA for definitive characterization, as well as imaging surveillance at 1 year 3. Cholelithiasis. 4. Hepatomegaly. Electronically Signed   By: Jearld Lesch M.D.   On: 09/10/2023 13:03      Edman Circle, MD 09/15/2023, 1:59 PM  Cc: Garlan Fillers, MD

## 2023-09-15 NOTE — Patient Instructions (Addendum)
_______________________________________________________  If your blood pressure at your visit was 140/90 or greater, please contact your primary care physician to follow up on this.  _______________________________________________________  If you are age 75 or older, your body mass index should be between 23-30. Your Body mass index is 39.71 kg/m. If this is out of the aforementioned range listed, please consider follow up with your Primary Care Provider.  If you are age 77 or younger, your body mass index should be between 19-25. Your Body mass index is 39.71 kg/m. If this is out of the aformentioned range listed, please consider follow up with your Primary Care Provider.   ________________________________________________________  [facility] providers would like to encourage you to use [facility] to communicate with providers for non-urgent requests or questions.  Due to long hold times on the telephone, sending your provider a message by [facility] may be a faster and more efficient way to get a response.  Please allow 48 business hours for a response.  Please remember that this is for non-urgent requests.  _______________________________________________________  Your provider has requested that you go to the basement level for lab work before leaving today. Press "B" on the elevator. The lab is located at the first door on the left as you exit the elevator.  Please follow up in 12 months. Give [place] a call at [phone] to see about your repeat MRI.  Thank you,  Dr. Lynann Bologna

## 2023-09-16 ENCOUNTER — Telehealth: Payer: Self-pay

## 2023-09-16 LAB — CANCER ANTIGEN 19-9: CA 19-9: 32 U/mL (ref <34)

## 2023-09-16 LAB — CEA: CEA: <2 ng/mL

## 2023-09-16 NOTE — Telephone Encounter (Signed)
-----   Message from [facility] sent at 09/16/2023  4:07 PM EDT ----- Regarding: RE: EUS in Feb/march 2025 RG, Although not 3 cm yet, as long as CA19-9 is normal, I think this is reasonable to get her EUS done.  Jacarie Pate, Put patient on my scheduled for February/March (Upper EUS); we'll adjust if CA19-9 is elevated. Thanks. 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 CA 19-9 - Pending (ordered today)  She also have renal lesion- likely would req Sx  Would like  EUS (if needed), to be done in Feb/March 2025  RG

## 2023-09-16 NOTE — Telephone Encounter (Signed)
Recall entered  °

## 2023-10-05 ENCOUNTER — Encounter (HOSPITAL_BASED_OUTPATIENT_CLINIC_OR_DEPARTMENT_OTHER): Payer: Self-pay | Admitting: Radiology

## 2023-10-05 ENCOUNTER — Ambulatory Visit (HOSPITAL_BASED_OUTPATIENT_CLINIC_OR_DEPARTMENT_OTHER)
Admission: RE | Admit: 2023-10-05 | Discharge: 2023-10-05 | Disposition: A | Discharge status: Home / Self Care | Payer: Medicare Other | Source: Ambulatory Visit

## 2023-10-05 DIAGNOSIS — Z1231 Encounter for screening mammogram for malignant neoplasm of breast: Secondary | ICD-10-CM | POA: Insufficient documentation

## 2023-10-12 ENCOUNTER — Other Ambulatory Visit (HOSPITAL_COMMUNITY): Payer: Self-pay | Admitting: *Deleted

## 2023-10-13 ENCOUNTER — Ambulatory Visit (HOSPITAL_COMMUNITY)
Admission: RE | Admit: 2023-10-13 | Discharge: 2023-10-13 | Disposition: A | Discharge status: Home / Self Care | Payer: Medicare Other | Source: Ambulatory Visit | Attending: Internal Medicine | Admitting: Internal Medicine

## 2023-10-13 DIAGNOSIS — E119 Type 2 diabetes mellitus without complications: Secondary | ICD-10-CM | POA: Diagnosis present

## 2023-10-13 DIAGNOSIS — E782 Mixed hyperlipidemia: Secondary | ICD-10-CM | POA: Insufficient documentation

## 2023-10-13 MED ORDER — SODIUM CHLORIDE 0.9 % IV SOLN
510.0000 mg | INTRAVENOUS | Status: DC
  Administered 2023-10-13: 510 mg via INTRAVENOUS
  Filled 2023-10-13: qty 510

## 2023-10-20 ENCOUNTER — Ambulatory Visit (HOSPITAL_COMMUNITY)
Admission: RE | Admit: 2023-10-20 | Discharge: 2023-10-20 | Disposition: A | Discharge status: Home / Self Care | Payer: Medicare Other | Source: Ambulatory Visit | Attending: Internal Medicine | Admitting: Internal Medicine

## 2023-10-20 DIAGNOSIS — D649 Anemia, unspecified: Secondary | ICD-10-CM | POA: Insufficient documentation

## 2023-10-20 MED ORDER — SODIUM CHLORIDE 0.9 % IV SOLN
510.0000 mg | INTRAVENOUS | Status: AC
  Administered 2023-10-20: 510 mg via INTRAVENOUS
  Filled 2023-10-20: qty 510

## 2023-10-23 NOTE — Progress Notes (Unsigned)
Cardiology Office Note   Date:  10/23/2023   ID:  Veronica Hansen, DOB 1946-06-20, MRN [id number]  PCP:  Garlan Fillers, MD  Cardiologist:   Dietrich Pates, MD   Patient presents for follow up of HTN    History of Present Illness: Veronica Hansen is a 77 y.o. female with a history of DM, HTN, OSA (using CPAP), obesity, hx atrial flutter (s/p ablation in 11/11/18),   Hx stomach stapling in 11-Nov-2018   Weight down from 360     Pt is followed by Donavan Burnet   Complained of DOE   Referred to cardiology I saw the pt in January   She was set up for a PET CT to evaluate perfusion   This was normal  with normal flow reserve   LVEF 59% increasing to 65%   Aorta was dilated at 4.6 cm       PFTs also done  Normal     Sinc seen she has done well  No CP   Breathing is OK  She has cut out carbs    She is doing water aerobics    I saw the pt in Jan 2024   Since seen she continues to lose wt  Working on diet   Minimizing  carbs     Getting protein  I saw the pt In March 2024  No outpatient medications have been marked as taking for the 10/26/23 encounter (Appointment) with Pricilla Riffle, MD.     Allergies:   Orlistat and Phentermine hcl   Past Medical History:  Diagnosis Date   A-fib (HCC)    A-fib (HCC) 07/2018   treated with ablation procedure in October 2019   Allergic rhinitis    Anemia    Arthritis    joints hurt   ASCVD (arteriosclerotic cardiovascular disease)    Atypical chest pain    cardiology consult showed an echocardiogram with left ventricular hypertrophy normal left ventricular function.   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   COPD (chronic obstructive pulmonary disease) (HCC)    Depression    husband died of Covid 11/11/20   Diabetes mellitus    type 2, no longer takes medication    Diabetes mellitus (HCC)    Dyspnea    GERD (gastroesophageal reflux disease)    History of blood transfusion    HTN (hypertension)    Hypercholesterolemia    Hyperlipidemia    Hypertension     Incontinence    Iron deficiency anemia    Lower esophageal sphincter, relaxation    Morbid obesity (HCC)    OSA (obstructive sleep apnea)    OSA on CPAP 03/07/2014   OSA on CPAP    Proteinuria 2001   Psoriasis    RLL pneumonia 06/2016   Runny nose    seasonal - poss allergies   Sinus problem    SOB (shortness of breath)    Tobacco use    Stopped in November 12, 2003    Past Surgical History:  Procedure Laterality Date   A-FLUTTER ABLATION N/A 08/19/2018   Procedure: A-FLUTTER ABLATION;  Surgeon: Marinus Maw, MD;  [facility];  Service: Cardiovascular;  Laterality: N/A;   ATRIAL FLUTTER ABLATION     BARIATRIC SURGERY  11/11/2012   dental implants  11/16/2009   LARYNX SURGERY     polyp removed   left breast bx was fibrocystic change     left cataract extraction and lens implantation  01/11/2019   ORIF ULNAR FRACTURE Right 02/15/2021   Procedure: OPEN REDUCTION INTERNAL FIXATION (ORIF) ULNAR FRACTURE and distal radius open reduction and internal fixation and repair;  Surgeon: Bradly Bienenstock, MD;  Location: [facility] OR;  Service: Orthopedics;  Laterality: Right;  with IV sedation   Right TKR  2007   TOTAL HIP ARTHROPLASTY Right 06/03/2023   Procedure: TOTAL HIP ARTHROPLASTY ANTERIOR APPROACH;  Surgeon: Durene Romans, MD;  Location: [facility];  Service: Orthopedics;  Laterality: Right;   TOTAL KNEE ARTHROPLASTY  11/17/2003   left   TOTAL KNEE ARTHROPLASTY  11/16/2005   right     Social History:  The patient  reports that she quit smoking about 23 years ago. Her smoking use included cigarettes. She started smoking about 43 years ago. She has never used smokeless tobacco. She reports that she does not drink alcohol and does not use drugs.   Family History:  The patient's family history includes Diabetes in her brother and father.    ROS:  Please see the history of present illness. All other systems are reviewed and  Negative to the above problem except as noted.    PHYSICAL  EXAM: VS:  There were no vitals taken for this visit.  GEN: Morbidly obese 77 yo  in no acute distress  HEENT: normal  Neck: no JVD, carotid bruit Cardiac: RRR; no murmur.  No LE  edema 2+ PT pulses   Respiratory:  clear to auscultation bilaterally   GI: soft, nontender, nondistended, + BS  No hepatomegaly   EKG:  EKG is not ordered   Lipid Panel No results found for: "CHOL", "TRIG", "HDL", "CHOLHDL", "VLDL", "LDLCALC", "LDLDIRECT"    Wt Readings from Last 3 Encounters:  10/20/23 243 lb (110.2 kg)  10/13/23 243 lb (110.2 kg)  09/15/23 246 lb (111.6 kg)      ASSESSMENT AND PLAN:  1  Dyspnea   Pt denies signifciant dyspnea   Working out in [employer] (PET/CT and PFTs normal, except for aorta being dilated)  2  HTN  BP is good  Keep on current regimen     Follow as loses wt  3  Aortic aneurysm   Will get CT in July 2024  4  HL  LDL 70  HDL 43   Trig 102  Keep on statin  4  DM  A1C 5      5  morbid obesity  Losing weight with Trulicity and diet changes and exercise   Congratulated     Follow up Late nov/ December 2024     Current medicines are reviewed at length with the patient today.  The patient does not have concerns regarding medicines.  Signed, Dietrich Pates, MD  10/23/2023 10:48 PM    [facility] [address] [place], [place], [place]  [address] Phone: [phone]; Fax: [phone]

## 2023-10-26 ENCOUNTER — Encounter: Payer: Self-pay | Admitting: Internal Medicine

## 2023-10-26 ENCOUNTER — Ambulatory Visit: Payer: Medicare Other | Attending: Internal Medicine | Admitting: Internal Medicine

## 2023-10-26 VITALS — BP 120/82 | HR 70 | Ht 66.0 in | Wt 243.2 lb

## 2023-10-26 DIAGNOSIS — I779 Disorder of arteries and arterioles, unspecified: Secondary | ICD-10-CM

## 2023-10-26 DIAGNOSIS — I77819 Aortic ectasia, unspecified site: Secondary | ICD-10-CM

## 2023-10-26 NOTE — Patient Instructions (Signed)
Medication Instructions:   *If you need a refill on your cardiac medications before your next appointment, please call your pharmacy*   Lab Work:  If you have labs (blood work) drawn today and your tests are completely normal, you will receive your results only by: MyChart Message (if you have MyChart) OR A paper copy in the mail If you have any lab test that is abnormal or we need to change your treatment, we will call you to review the results.   Testing/Procedures: CT AORTA April/ MAY 2025   Follow-Up: At [facility], you and your health needs are our priority.  As part of our continuing mission to provide you with exceptional heart care, we have created designated Provider Care Teams.  These Care Teams include your primary Cardiologist (physician) and Advanced Practice Providers (APPs -  Physician Assistants and Nurse Practitioners) who all work together to provide you with the care you need, when you need it.  We recommend signing up for the patient portal called "MyChart".  Sign up information is provided on this After Visit Summary.  MyChart is used to connect with patients for Virtual Visits (Telemedicine).  Patients are able to view lab/test results, encounter notes, upcoming appointments, etc.  Non-urgent messages can be sent to your provider as well.   To learn more about what you can do with MyChart, go to [email or web address].    Your next appointment:  Aug 2025

## 2023-10-27 ENCOUNTER — Ambulatory Visit: Payer: Medicare Other | Admitting: Gastroenterology

## 2023-11-24 ENCOUNTER — Ambulatory Visit: Payer: Medicare Other | Admitting: Urology

## 2023-11-25 ENCOUNTER — Ambulatory Visit (INDEPENDENT_AMBULATORY_CARE_PROVIDER_SITE_OTHER): Payer: HMO | Admitting: Urology

## 2023-11-25 ENCOUNTER — Encounter: Payer: Self-pay | Admitting: Urology

## 2023-11-25 VITALS — BP 144/82 | HR 65 | Ht 66.0 in | Wt 244.0 lb

## 2023-11-25 DIAGNOSIS — D1771 Benign lipomatous neoplasm of kidney: Secondary | ICD-10-CM

## 2023-11-25 LAB — URINALYSIS, ROUTINE W REFLEX MICROSCOPIC
Bilirubin, UA: NEGATIVE
Glucose, UA: NEGATIVE
Ketones, UA: NEGATIVE
Nitrite, UA: NEGATIVE
RBC, UA: NEGATIVE
Specific Gravity, UA: 1.025 (ref 1.005–1.030)
Urobilinogen, Ur: 0.2 mg/dL (ref 0.2–1.0)
pH, UA: 6 (ref 5.0–7.5)

## 2023-11-25 LAB — MICROSCOPIC EXAMINATION

## 2023-11-25 NOTE — Progress Notes (Signed)
 Assessment: 1. Renal angiomyolipoma     Plan: Today I had a long discussion with the patient regarding management options for her right renal AML.  I also today called and discussed her case with Dr. Marcey Moan in interventional radiology.  He thinks this would be amenable to selective arterial embolization.  The patient would like to proceed with this.  Will arrange consultation with Dr. Moan at their outpatient office.  Patient will follow-up with me after embolization.  Chief Complaint: Renal mass  HPI: Veronica Hansen is a 78 y.o. female who presents for continued evaluation of right renal aml. Patient has a approximately 4.7 cm right renal lesion most consistent with AML she also has a left lower pole Bosniak 40F renal cyst that we will need serial imaging follow-up.  See my note 08/25/2023 at the time of initial visit for detailed history.  At that time the patient had a lot going on and wanted to wait till after the first of the year to make a plan regarding her right renal mass.  Portions of the above documentation were copied from a prior visit for review purposes only.  Allergies: Allergies  Allergen Reactions   Orlistat Diarrhea    Other reaction(s): severe diarrhea   Phentermine Hcl     Other reaction(s): elevated BP    PMH: Past Medical History:  Diagnosis Date   A-fib (HCC)    A-fib (HCC) 07/2018   treated with ablation procedure in October 2019   Allergic rhinitis    Anemia    Arthritis    joints hurt   ASCVD (arteriosclerotic cardiovascular disease)    Atypical chest pain    cardiology consult showed an echocardiogram with left ventricular hypertrophy normal left ventricular function.   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   COPD (chronic obstructive pulmonary disease) (HCC)    Depression    husband died of Covid 11/13/20   Diabetes mellitus    type 2, no longer takes medication    Diabetes mellitus (HCC)    Dyspnea    GERD (gastroesophageal  reflux disease)    History of blood transfusion    HTN (hypertension)    Hypercholesterolemia    Hyperlipidemia    Hypertension    Incontinence    Iron deficiency anemia    Lower esophageal sphincter, relaxation    Morbid obesity (HCC)    OSA (obstructive sleep apnea)    OSA on CPAP 03/07/2014   OSA on CPAP    Proteinuria 2001   Psoriasis    RLL pneumonia 06/2016   Runny nose    seasonal - poss allergies   Sinus problem    SOB (shortness of breath)    Tobacco use    Stopped in 11-14-03    PSH: Past Surgical History:  Procedure Laterality Date   A-FLUTTER ABLATION N/A 08/19/2018   Procedure: A-FLUTTER ABLATION;  Surgeon: Waddell Danelle ORN, MD;  [facility];  Service: Cardiovascular;  Laterality: N/A;   ATRIAL FLUTTER ABLATION     BARIATRIC SURGERY  2012/11/13   dental implants  11/16/2009   LARYNX SURGERY     polyp removed   left breast bx was fibrocystic change     left cataract extraction and lens implantation  01/11/2019   ORIF ULNAR FRACTURE Right 02/15/2021   Procedure: OPEN REDUCTION INTERNAL FIXATION (ORIF) ULNAR FRACTURE and distal radius open reduction and internal fixation and repair;  Surgeon: Shari Easter, MD;  Location: [facility];  Service: Orthopedics;  Laterality: Right;  with IV sedation   Right TKR  2007   TOTAL HIP ARTHROPLASTY Right 06/03/2023   Procedure: TOTAL HIP ARTHROPLASTY ANTERIOR APPROACH;  Surgeon: Ernie Cough, MD;  Location: [facility];  Service: Orthopedics;  Laterality: Right;   TOTAL KNEE ARTHROPLASTY  11/17/2003   left   TOTAL KNEE ARTHROPLASTY  11/16/2005   right    SH: Social History   Tobacco Use   Smoking status: Former    Current packs/day: 0.00    Types: Cigarettes    Start date: 08/19/1980    Quit date: 08/19/2000    Years since quitting: 23.2   Smokeless tobacco: Never  Vaping Use   Vaping status: Never Used  Substance Use Topics   Alcohol  use: No    Comment: Rare   Drug use: No    ROS: Constitutional:  Negative  for fever, chills, weight loss CV: Negative for chest pain, previous MI, hypertension Respiratory:  Negative for shortness of breath, wheezing, sleep apnea, frequent cough GI:  Negative for nausea, vomiting, bloody stool, GERD  PE: BP (!) 144/82   Pulse 65   Ht 5' 6 (1.676 m)   Wt 244 lb (110.7 kg)   BMI 39.38 kg/m  GENERAL APPEARANCE:  Well appearing, well developed, well nourished, NAD    Results: UA neg

## 2023-12-01 ENCOUNTER — Other Ambulatory Visit: Payer: Self-pay | Admitting: Urology

## 2023-12-01 DIAGNOSIS — D1771 Benign lipomatous neoplasm of kidney: Secondary | ICD-10-CM

## 2023-12-27 ENCOUNTER — Ambulatory Visit
Admission: RE | Admit: 2023-12-27 | Discharge: 2023-12-27 | Disposition: A | Discharge status: Home / Self Care | Payer: PPO | Source: Ambulatory Visit | Attending: Urology | Admitting: Urology

## 2023-12-27 DIAGNOSIS — D1771 Benign lipomatous neoplasm of kidney: Secondary | ICD-10-CM | POA: Diagnosis not present

## 2023-12-27 HISTORY — PX: IR RADIOLOGIST EVAL & MGMT: IMG5224

## 2023-12-27 NOTE — Consult Note (Signed)
 Chief Complaint: Patient was seen in consultation today for right renal angiomyolipoma at the request of Hall,Marshall C  Referring Physician(s): Hall,Marshall C  History of Present Illness: Veronica Hansen is a 78 y.o. female with initial detection of a 5.2 cm mass of the superior right kidney by CTA of the chest on 07/14/2023. This was further characterized by CT on 07/28/2023 with predominant internal fat density consistent with an angiomyolipoma (AML). MRI was performed on 09/07/23 primarily due to a small cystic lesion of the tail of the pancreas, but also demonstrated the roughly 5.2 cm right renal mass. She was referred to Dr. Del Favia who discussed possible embolization of the AML with her and has referred her for consultation. Dr. Venice Gillis is following her pancreatic cystic lesion.  She has had no pain or hematuria referable to the right renal AML. A prior abdominal ultrasound on 03/10/11 demonstrated an echogenic lesion of the superior right kidney measuring 3 cm and likely representing AML.  Past Medical History:  Diagnosis Date   A-fib (HCC)    A-fib (HCC) 07/2018   treated with ablation procedure in October 2019   Allergic rhinitis    Anemia    Arthritis    joints hurt   ASCVD (arteriosclerotic cardiovascular disease)    Atypical chest pain    cardiology consult showed an echocardiogram with left ventricular hypertrophy normal left ventricular function.   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   COPD (chronic obstructive pulmonary disease) (HCC)    Depression    husband died of Covid Apr 15, 2020   Diabetes mellitus    type 2, no longer takes medication    Diabetes mellitus (HCC)    Dyspnea    GERD (gastroesophageal reflux disease)    History of blood transfusion    HTN (hypertension)    Hypercholesterolemia    Hyperlipidemia    Hypertension    Incontinence    Iron deficiency anemia    Lower esophageal sphincter, relaxation    Morbid obesity (HCC)    OSA (obstructive sleep  apnea)    OSA on CPAP 03/07/2014   OSA on CPAP    Proteinuria 2001   Psoriasis    RLL pneumonia 06/2016   Runny nose    seasonal - poss allergies   Sinus problem    SOB (shortness of breath)    Tobacco use    Stopped in 04/16/03    Past Surgical History:  Procedure Laterality Date   A-FLUTTER ABLATION N/A 08/19/2018   Procedure: A-FLUTTER ABLATION;  Surgeon: Tammie Fall, MD;  [facility];  Service: Cardiovascular;  Laterality: N/A;   ATRIAL FLUTTER ABLATION     BARIATRIC SURGERY  2012/04/15   dental implants  11/16/2009   LARYNX SURGERY     polyp removed   left breast bx was fibrocystic change     left cataract extraction and lens implantation  01/11/2019   ORIF ULNAR FRACTURE Right 02/15/2021   Procedure: OPEN REDUCTION INTERNAL FIXATION (ORIF) ULNAR FRACTURE and distal radius open reduction and internal fixation and repair;  Surgeon: Arvil Birks, MD;  Location: [facility] OR;  Service: Orthopedics;  Laterality: Right;  with IV sedation   Right TKR  Apr 15, 2006   TOTAL HIP ARTHROPLASTY Right 06/03/2023   Procedure: TOTAL HIP ARTHROPLASTY ANTERIOR APPROACH;  Surgeon: Claiborne Crew, MD;  Location: [facility];  Service: Orthopedics;  Laterality: Right;   TOTAL KNEE ARTHROPLASTY  11/17/2003   left   TOTAL KNEE ARTHROPLASTY  11/16/2005   right    Allergies: Orlistat and Phentermine hcl  Medications: Prior to Admission medications   Medication Sig Start Date End Date Taking? Authorizing Provider  amLODipine  (NORVASC ) 5 MG tablet TAKE 1 TABLET BY MOUTH DAILY 09/06/23   Elmyra Haggard, MD  Calcium-Vitamin D-Vitamin K (VIACTIV CALCIUM PLUS D) 650-12.5-40 MG-MCG-MCG CHEW Chew 1 tablet by mouth daily.    [provider]  escitalopram  (LEXAPRO ) 10 MG tablet Take 10 mg by mouth daily. 10/04/20   [provider]  ezetimibe  (ZETIA ) 10 MG tablet Take 10 mg by mouth daily. 01/09/21   [provider]  ferrous sulfate  325 (65 FE) MG tablet Take 325 mg by mouth every  other day.    [provider]  glucose blood (ONETOUCH VERIO) test strip 1 each by Other route in the morning, at noon, in the evening, and at bedtime. Use as instructed    [provider]  methocarbamol  (ROBAXIN ) 500 MG tablet Take 1 tablet (500 mg total) by mouth every 6 (six) hours as needed for muscle spasms. 06/04/23   Earnie Gola, PA-C  metoprolol  succinate (TOPROL -XL) 100 MG 24 hr tablet Take 100 mg by mouth daily. 02/05/21   [provider]  Multiple Vitamins-Minerals (CENTRUM ADULTS PO) Take 1 tablet by mouth daily.    [provider]  olmesartan (BENICAR) 40 MG tablet Take 40 mg by mouth daily. 01/09/21   [provider]  OneTouch Delica Lancets 33G MISC by Does not apply route as directed.    [provider]  Polyethyl Glycol-Propyl Glycol (SYSTANE OP) Place 1 drop into both eyes as needed (dry eyes).    [provider]  simvastatin  (ZOCOR ) 40 MG tablet Take 40 mg by mouth daily.    [provider]  TRULICITY 4.5 MG/0.5ML SOPN Inject 4.5 mg into the skin every 7 (seven) days. 07/14/22   [provider]     Family History  Problem Relation Age of Onset   Diabetes Father    Diabetes Brother    Colon cancer Neg Hx    Rectal cancer Neg Hx    Stomach cancer Neg Hx     Social History   Socioeconomic History   Marital status: Widowed    Spouse name: Not on file   Number of children: 3   Years of education: Not on file   Highest education level: Bachelor's degree (e.g., BA, AB, BS)  Occupational History   Occupation: Retired  Tobacco Use   Smoking status: Former    Current packs/day: 0.00    Types: Cigarettes    Start date: 08/19/1980    Quit date: 08/19/2000    Years since quitting: 23.3   Smokeless tobacco: Never  Vaping Use   Vaping status: Never Used  Substance and Sexual Activity   Alcohol use: No    Comment: Rare   Drug use: No   Sexual activity: Not Currently    Comment: Quit ~  2007  Other Topics Concern   Not on file  Social History Narrative   Not on file   Social Drivers of Health   Financial Resource Strain: Not on file  Food Insecurity: No Food Insecurity (06/03/2023)   Hunger Vital Sign    Worried About Running Out of Food in the Last Year: Never true    Ran Out of Food in the Last Year: Never true  Transportation Needs: No Transportation Needs (06/03/2023)   PRAPARE - Transportation    Lack  of Transportation (Medical): No    Lack of Transportation (Non-Medical): No  Physical Activity: Not on file  Stress: Not on file  Social Connections: Not on file     Review of Systems: A 12 point ROS discussed and pertinent positives are indicated in the HPI above.  All other systems are negative.  Review of Systems  Constitutional: Negative.   Respiratory: Negative.    Cardiovascular: Negative.   Gastrointestinal: Negative.   Genitourinary: Negative.   Musculoskeletal: Negative.   Neurological: Negative.     Vital Signs: BP (!) 159/92   Pulse 64   Temp 98.2 F (36.8 C) (Oral)   Resp 19   SpO2 94%     Physical Exam Vitals reviewed.  Constitutional:      Appearance: Normal appearance.  Cardiovascular:     Rate and Rhythm: Normal rate and regular rhythm.     Heart sounds: Normal heart sounds. No murmur heard.    No gallop.  Pulmonary:     Effort: Pulmonary effort is normal. No respiratory distress.     Breath sounds: Normal breath sounds. No stridor. No wheezing, rhonchi or rales.  Abdominal:     General: Bowel sounds are normal. There is no distension.     Palpations: Abdomen is soft. There is no mass.     Tenderness: There is no abdominal tenderness. There is no right CVA tenderness, left CVA tenderness, guarding or rebound.  Musculoskeletal:     Cervical back: Neck supple. No tenderness.     Comments: Mild lower extremity edema of both lower legs.  Skin:    General: Skin is warm and dry.  Neurological:     General: No focal deficit  present.     Mental Status: She is alert and oriented to person, place, and time.      Imaging: No results found.  Labs:  CBC: Recent Labs    05/25/23 1337 06/04/23 0356 09/15/23 1427  WBC 9.9 12.1* 9.0  HGB 11.5* 9.8* 11.1*  HCT 37.9 32.4* 36.0  PLT 252 209 305.0    COAGS: Recent Labs    09/15/23 1427  INR 1.1*    BMP: Recent Labs    05/25/23 1337 06/04/23 0356 07/14/23 1206 09/15/23 1427  NA 135 135  --  138  K 4.3 4.3  --  4.2  CL 103 104  --  102  CO2 24 23  --  31  GLUCOSE 82 108*  --  81  BUN 11 14  --  10  CALCIUM 9.2 8.9  --  9.9  CREATININE 0.52 0.52 0.60 0.55  GFRNONAA >60 >60  --   --     LIVER FUNCTION TESTS: Recent Labs    09/15/23 1427  BILITOT 0.6  AST 26  ALT 20  ALKPHOS 78  PROT 7.6  ALBUMIN 4.2    TUMOR MARKERS: Recent Labs    09/15/23 1427  CEA <2.0  CA199 32    Assessment and Plan:  I met with Ms. Egerton and reviewed all of her imaging studies with her. By my measurements on the 09/07/23 MRI, the right upper pole renal AML measures 5.4 x 4.9 x 4.6 cm. There is some irregular internal enhancement and vascularity. This clearly has enlarged since the ultrasound in 2012. We discussed the natural history of renal AML's including the risk for spontaneous hemorrhage, which becomes higher over a size of 4-4.5 cm. Given current size of over 5 cm, elective embolization of blood supply would be  recommended to cause tumor shrinkage and decrease the risk of spontaneous hemorrhage.  We discussed details of embolization, including femoral arterial approach, selective arteriography to map blood supply, sub-selective catheterization and particle embolization of tumor supply. The procedure is performed under conscious sedation as an outpatient with discharge typically same day after recovery. Follow up would consist of periodic imaging to follow the AML after embolization.  After discussion, Ms. Gethers would like to proceed with the  scheduling process. The procedure will likely be performed at [facility].   Thank you for this interesting consult.  I greatly enjoyed meeting Onesha Baller and look forward to participating in their care.  A copy of this report was sent to the requesting provider on this date.  Electronically Signed: Aileen Alexanders 12/27/2023, 12:26 PM    I spent a total of 30 Minutes in face to face in clinical consultation, greater than 50% of which was counseling/coordinating care for a right renal angiomyolipoma.

## 2024-01-05 ENCOUNTER — Other Ambulatory Visit (HOSPITAL_COMMUNITY): Payer: Self-pay | Admitting: Interventional Radiology

## 2024-01-05 DIAGNOSIS — D1771 Benign lipomatous neoplasm of kidney: Secondary | ICD-10-CM

## 2024-01-20 ENCOUNTER — Other Ambulatory Visit: Payer: Self-pay | Admitting: Radiology

## 2024-01-20 DIAGNOSIS — D1771 Benign lipomatous neoplasm of kidney: Secondary | ICD-10-CM

## 2024-01-20 NOTE — H&P (Signed)
 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  She underwent consultation with Dr. Fredia Sorrow on 12/27/2023 to discuss treatment options and was deemed an appropriate candidate for AML embolization.  She presents today for the procedure.   Patient is Full Code  Past Medical History:  Diagnosis Date   A-fib (HCC)    A-fib (HCC) 07/2018   treated with ablation procedure in October 2019   Allergic rhinitis    Anemia    Arthritis    joints hurt   ASCVD (arteriosclerotic cardiovascular disease)    Atypical chest pain    cardiology consult showed an echocardiogram with left ventricular hypertrophy normal left ventricular function.   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   COPD (chronic obstructive pulmonary disease) (HCC)    Depression    husband died of Covid 02-08-20   Diabetes mellitus    type 2, no longer takes medication    Diabetes mellitus (HCC)    Dyspnea    GERD (gastroesophageal reflux disease)    History of blood transfusion    HTN (hypertension)    Hypercholesterolemia    Hyperlipidemia    Hypertension    Incontinence    Iron deficiency anemia    Lower esophageal sphincter, relaxation    Morbid obesity (HCC)    OSA (obstructive sleep apnea)    OSA on CPAP 03/07/2014   OSA on CPAP    Proteinuria 2001   Psoriasis    RLL pneumonia 06/2016   Runny nose    seasonal - poss allergies   Sinus problem    SOB (shortness of breath)    Tobacco use     Stopped in 2003-02-08    Past Surgical History:  Procedure Laterality Date   A-FLUTTER ABLATION N/A 08/19/2018   Procedure: A-FLUTTER ABLATION;  Surgeon: Marinus Maw, MD;  Location: [facility] INVASIVE CV LAB;  Service: Cardiovascular;  Laterality: N/A;   ATRIAL FLUTTER ABLATION     BARIATRIC SURGERY  February 08, 2012   dental implants  11/16/2009   IR RADIOLOGIST EVAL & MGMT  12/27/2023   LARYNX SURGERY     polyp removed   left breast bx was fibrocystic change     left cataract extraction and lens implantation  01/11/2019   ORIF ULNAR FRACTURE Right 02/15/2021   Procedure: OPEN REDUCTION INTERNAL FIXATION (ORIF) ULNAR FRACTURE and distal radius open reduction and internal fixation and repair;  Surgeon: Bradly Bienenstock, MD;  Location: [facility] OR;  Service: Orthopedics;  Laterality: Right;  with IV sedation   Right TKR  2006-02-07   TOTAL HIP ARTHROPLASTY Right 06/03/2023   Procedure: TOTAL HIP ARTHROPLASTY ANTERIOR APPROACH;  Surgeon: Durene Romans, MD;  Location: [facility] ORS;  Service: Orthopedics;  Laterality: Right;   TOTAL KNEE ARTHROPLASTY  11/17/2003   left   TOTAL KNEE ARTHROPLASTY  11/16/2005   right    Allergies: Orlistat and Phentermine hcl  Medications: Prior to Admission medications   Medication Sig Start Date End Date Taking? Authorizing Provider  amLODipine (NORVASC) 5 MG tablet TAKE 1 TABLET BY MOUTH DAILY 09/06/23  Pricilla Riffle, MD  Calcium-Vitamin D-Vitamin K (VIACTIV CALCIUM PLUS D) 650-12.5-40 MG-MCG-MCG CHEW Chew 1 tablet by mouth daily.    [provider]  escitalopram (LEXAPRO) 10 MG tablet Take 10 mg by mouth daily. 10/04/20   [provider]  ezetimibe (ZETIA) 10 MG tablet Take 10 mg by mouth daily. 01/09/21   [provider]  ferrous sulfate 325 (65 FE) MG tablet Take 325 mg by mouth every other day.    [provider]  glucose blood (ONETOUCH VERIO) test strip 1 each by Other route in the morning, at noon, in the evening, and at bedtime. Use as instructed     [provider]  methocarbamol (ROBAXIN) 500 MG tablet Take 1 tablet (500 mg total) by mouth every 6 (six) hours as needed for muscle spasms. 06/04/23   Cassandria Anger, PA-C  metoprolol succinate (TOPROL-XL) 100 MG 24 hr tablet Take 100 mg by mouth daily. 02/05/21   [provider]  Multiple Vitamins-Minerals (CENTRUM ADULTS PO) Take 1 tablet by mouth daily.    [provider]  olmesartan (BENICAR) 40 MG tablet Take 40 mg by mouth daily. 01/09/21   [provider]  OneTouch Delica Lancets 33G MISC by Does not apply route as directed.    [provider]  Polyethyl Glycol-Propyl Glycol (SYSTANE OP) Place 1 drop into both eyes as needed (dry eyes).    [provider]  simvastatin (ZOCOR) 40 MG tablet Take 40 mg by mouth daily.    [provider]  TRULICITY 4.5 MG/0.5ML SOPN Inject 4.5 mg into the skin every 7 (seven) days. 07/14/22   [provider]     Family History  Problem Relation Age of Onset   Diabetes Father    Diabetes Brother    Colon cancer Neg Hx    Rectal cancer Neg Hx    Stomach cancer Neg Hx     Social History   Socioeconomic History   Marital status: Widowed    Spouse name: Not on file   Number of children: 3   Years of education: Not on file   Highest education level: Bachelor's degree (e.g., BA, AB, BS)  Occupational History   Occupation: Retired  Tobacco Use   Smoking status: Former    Current packs/day: 0.00    Types: Cigarettes    Start date: 08/19/1980    Quit date: 08/19/2000    Years since quitting: 23.4   Smokeless tobacco: Never  Vaping Use   Vaping status: Never Used  Substance and Sexual Activity   Alcohol use: No    Comment: Rare   Drug use: No   Sexual activity: Not Currently    Comment: Quit ~ 2007  Other Topics Concern   Not on file  Social History Narrative   Not on file   Social Drivers of Health   Financial Resource Strain: Not on file  Food Insecurity: No Food  Insecurity (06/03/2023)   Hunger Vital Sign    Worried About Running Out of Food in the Last Year: Never true    Ran Out of Food in the Last Year: Never true  Transportation Needs: No Transportation Needs (06/03/2023)   PRAPARE - Administrator, Civil Service (Medical): No    Lack of Transportation (Non-Medical): No  Physical Activity: Not on file  Stress: Not on file  Social Connections: Not on file      Review of Systems denies fever,HA,CP,cough, abd pain,N/V or bleeding; she  does have some dyspnea with exertion, occ back pain; she is anxious  Vital Signs: Vitals:   01/21/24 0740  BP: 126/66  Pulse: (!) 55  Resp: 19  Temp: 97.8 F (36.6 C)  SpO2: 93%      Advance Care Plan: No documents on file  Physical Exam; awake/alert; chest- CTA bilat; heart- sl bradycardic but reg rhythm; abd-soft,+BS,NT; no sig LE edema; intact distal pulses  Imaging: IR Radiologist Eval & Mgmt Result Date: 12/27/2023 CLINICAL DATA:  IR clinic consultation. EXAM: IR EVAL AND MANAGEMENT COMPARISON:  None Available. FINDINGS: See dictated note in Epic. IMPRESSION: See dictated note in Epic. Electronically Signed   By: Irish Lack M.D.   On: 12/27/2023 14:31    Labs:  CBC: Recent Labs    05/25/23 1337 06/04/23 0356 09/15/23 1427  WBC 9.9 12.1* 9.0  HGB 11.5* 9.8* 11.1*  HCT 37.9 32.4* 36.0  PLT 252 209 305.0    COAGS: Recent Labs    09/15/23 1427  INR 1.1*    BMP: Recent Labs    05/25/23 1337 06/04/23 0356 07/14/23 1206 09/15/23 1427  NA 135 135  --  138  K 4.3 4.3  --  4.2  CL 103 104  --  102  CO2 24 23  --  31  GLUCOSE 82 108*  --  81  BUN 11 14  --  10  CALCIUM 9.2 8.9  --  9.9  CREATININE 0.52 0.52 0.60 0.55  GFRNONAA >60 >60  --   --     LIVER FUNCTION TESTS: Recent Labs    09/15/23 1427  BILITOT 0.6  AST 26  ALT 20  ALKPHOS 78  PROT 7.6  ALBUMIN 4.2    TUMOR MARKERS: Recent Labs    09/15/23 1427  CEA <2.0  CA199 32     Assessment and Plan: 78 y.o. female ex smoker with past medical history significant for atrial fibrillation with prior ablation 2019, anemia, arthritis, cardiovascular disease, pancreatic cystic lesion, skin cancer, COPD, depression, diabetes, GERD, hypertension, hyperlipidemia, obesity, sleep apnea, psoriasis who presents now with an enlarging right upper pole renal AML.  She underwent consultation with Dr. Fredia Sorrow on 12/27/2023 to discuss treatment options and was deemed an appropriate candidate for AML embolization.  She presents today for the procedure.Risks and benefits of bleeding were discussed with the patient including, but not limited to bleeding, infection, vascular injury or contrast induced renal failure.  This interventional procedure involves the use of X-rays and because of the nature of the planned procedure, it is possible that we will have prolonged use of X-ray fluoroscopy.  Potential radiation risks to you include (but are not limited to) the following: - A slightly elevated risk for cancer  several years later in life. This risk is typically less than 0.5% percent. This risk is low in comparison to the normal incidence of human cancer, which is 33% for women and 50% for men according to the [employer] Society. - Radiation induced injury can include skin redness, resembling a rash, tissue breakdown / ulcers and hair loss (which can be temporary or permanent).   The likelihood of either of these occurring depends on the difficulty of the procedure and whether you are sensitive to radiation due to previous procedures, disease, or genetic conditions.   IF your procedure requires a prolonged use of radiation, you will be notified and given written instructions for further action.  It is your responsibility to monitor the irradiated area for the  2 weeks following the procedure and to notify your physician if you are concerned that you have suffered a radiation induced injury.     All of the patient's questions were answered, patient is agreeable to proceed.  Consent signed and in chart.      Thank you for allowing our service to participate in Veronica Hansen 's care.  Electronically Signed: D. Jeananne Rama, PA-C   01/20/2024, 3:19 PM      I spent a total of    25 Minutes in face to face in clinical consultation, greater than 50% of which was counseling/coordinating care for right renal arteriogram with embolization of right renal angiomyolipoma

## 2024-01-21 ENCOUNTER — Other Ambulatory Visit (HOSPITAL_COMMUNITY): Payer: Self-pay | Admitting: Interventional Radiology

## 2024-01-21 ENCOUNTER — Ambulatory Visit (HOSPITAL_COMMUNITY)
Admission: RE | Admit: 2024-01-21 | Discharge: 2024-01-21 | Disposition: A | Discharge status: Home / Self Care | Payer: PPO | Source: Ambulatory Visit | Attending: Interventional Radiology | Admitting: Interventional Radiology

## 2024-01-21 ENCOUNTER — Other Ambulatory Visit: Payer: Self-pay

## 2024-01-21 ENCOUNTER — Encounter (HOSPITAL_COMMUNITY): Payer: Self-pay

## 2024-01-21 DIAGNOSIS — G4733 Obstructive sleep apnea (adult) (pediatric): Secondary | ICD-10-CM | POA: Insufficient documentation

## 2024-01-21 DIAGNOSIS — D1771 Benign lipomatous neoplasm of kidney: Secondary | ICD-10-CM

## 2024-01-21 DIAGNOSIS — I10 Essential (primary) hypertension: Secondary | ICD-10-CM | POA: Diagnosis not present

## 2024-01-21 DIAGNOSIS — K219 Gastro-esophageal reflux disease without esophagitis: Secondary | ICD-10-CM | POA: Diagnosis not present

## 2024-01-21 DIAGNOSIS — Z7985 Long-term (current) use of injectable non-insulin antidiabetic drugs: Secondary | ICD-10-CM | POA: Insufficient documentation

## 2024-01-21 DIAGNOSIS — E785 Hyperlipidemia, unspecified: Secondary | ICD-10-CM | POA: Insufficient documentation

## 2024-01-21 DIAGNOSIS — I4891 Unspecified atrial fibrillation: Secondary | ICD-10-CM | POA: Insufficient documentation

## 2024-01-21 DIAGNOSIS — E119 Type 2 diabetes mellitus without complications: Secondary | ICD-10-CM | POA: Diagnosis not present

## 2024-01-21 DIAGNOSIS — F32A Depression, unspecified: Secondary | ICD-10-CM | POA: Insufficient documentation

## 2024-01-21 DIAGNOSIS — Z87891 Personal history of nicotine dependence: Secondary | ICD-10-CM | POA: Insufficient documentation

## 2024-01-21 DIAGNOSIS — Z6841 Body Mass Index (BMI) 40.0 and over, adult: Secondary | ICD-10-CM | POA: Insufficient documentation

## 2024-01-21 DIAGNOSIS — I2510 Atherosclerotic heart disease of native coronary artery without angina pectoris: Secondary | ICD-10-CM | POA: Insufficient documentation

## 2024-01-21 DIAGNOSIS — J449 Chronic obstructive pulmonary disease, unspecified: Secondary | ICD-10-CM | POA: Insufficient documentation

## 2024-01-21 HISTORY — PX: IR EMBO TUMOR ORGAN ISCHEMIA INFARCT INC GUIDE ROADMAPPING: IMG5449

## 2024-01-21 HISTORY — PX: IR RENAL SELECTIVE  UNI INC S&I MOD SED: IMG654

## 2024-01-21 HISTORY — PX: IR RENAL SUPRASEL UNI S&I MOD SED: IMG655

## 2024-01-21 HISTORY — PX: IR US GUIDE VASC ACCESS RIGHT: IMG2390

## 2024-01-21 LAB — COMPREHENSIVE METABOLIC PANEL
ALT: 16 U/L (ref 0–44)
AST: 20 U/L (ref 15–41)
Albumin: 4.1 g/dL (ref 3.5–5.0)
Alkaline Phosphatase: 65 U/L (ref 38–126)
Anion gap: 9 (ref 5–15)
BUN: 12 mg/dL (ref 8–23)
CO2: 25 mmol/L (ref 22–32)
Calcium: 9.1 mg/dL (ref 8.9–10.3)
Chloride: 105 mmol/L (ref 98–111)
Creatinine, Ser: 0.44 mg/dL (ref 0.44–1.00)
GFR, Estimated: >60 mL/min (ref >60)
Glucose, Bld: 89 mg/dL (ref 70–99)
Potassium: 3.8 mmol/L (ref 3.5–5.1)
Sodium: 139 mmol/L (ref 135–145)
Total Bilirubin: 0.7 mg/dL (ref 0.0–1.2)
Total Protein: 7.2 g/dL (ref 6.5–8.1)

## 2024-01-21 LAB — CBC WITH DIFFERENTIAL/PLATELET
Abs Immature Granulocytes: 0.01 10*3/uL (ref 0.00–0.07)
Basophils Absolute: 0 10*3/uL (ref 0.0–0.1)
Basophils Relative: 0 %
Eosinophils Absolute: 0.1 10*3/uL (ref 0.0–0.5)
Eosinophils Relative: 2 %
HCT: 40.3 % (ref 36.0–46.0)
Hemoglobin: 12.7 g/dL (ref 12.0–15.0)
Immature Granulocytes: 0 %
Lymphocytes Relative: 24 %
Lymphs Abs: 1.6 10*3/uL (ref 0.7–4.0)
MCH: 26.3 pg (ref 26.0–34.0)
MCHC: 31.5 g/dL (ref 30.0–36.0)
MCV: 83.6 fL (ref 80.0–100.0)
Monocytes Absolute: 0.6 10*3/uL (ref 0.1–1.0)
Monocytes Relative: 9 %
Neutro Abs: 4.4 10*3/uL (ref 1.7–7.7)
Neutrophils Relative %: 65 %
Platelets: 213 10*3/uL (ref 150–400)
RBC: 4.82 MIL/uL (ref 3.87–5.11)
RDW: 14.8 % (ref 11.5–15.5)
WBC: 6.7 10*3/uL (ref 4.0–10.5)
nRBC: 0 % (ref 0.0–0.2)

## 2024-01-21 LAB — PROTIME-INR
INR: 1 (ref 0.8–1.2)
Prothrombin Time: 13.6 s (ref 11.4–15.2)

## 2024-01-21 MED ORDER — CEFAZOLIN SODIUM-DEXTROSE 2-4 GM/100ML-% IV SOLN
INTRAVENOUS | Status: AC | PRN
  Administered 2024-01-21: 2 g via INTRAVENOUS

## 2024-01-21 MED ORDER — MIDAZOLAM HCL 2 MG/2ML IJ SOLN
INTRAMUSCULAR | Status: AC
Start: 2024-01-21 — End: ?
  Filled 2024-01-21: qty 2

## 2024-01-21 MED ORDER — CEFAZOLIN SODIUM-DEXTROSE 2-4 GM/100ML-% IV SOLN
2.0000 g | INTRAVENOUS | Status: AC
  Filled 2024-01-21: qty 100

## 2024-01-21 MED ORDER — IOHEXOL 300 MG/ML  SOLN
100.0000 mL | Freq: Once | INTRAMUSCULAR | Status: AC | PRN
  Administered 2024-01-21: 36 mL via INTRA_ARTERIAL

## 2024-01-21 MED ORDER — SODIUM CHLORIDE 0.9 % IV SOLN: INTRAVENOUS | Status: DC

## 2024-01-21 MED ORDER — LIDOCAINE HCL 1 % IJ SOLN
20.0000 mL | Freq: Once | INTRAMUSCULAR | Status: AC
  Administered 2024-01-21: 5 mL via INTRADERMAL

## 2024-01-21 MED ORDER — LIDOCAINE HCL 1 % IJ SOLN
INTRAMUSCULAR | Status: AC
  Filled 2024-01-21: qty 20

## 2024-01-21 MED ORDER — FENTANYL CITRATE (PF) 100 MCG/2ML IJ SOLN
INTRAMUSCULAR | Status: AC | PRN
  Administered 2024-01-21 (×2): 50 ug via INTRAVENOUS

## 2024-01-21 MED ORDER — MIDAZOLAM HCL 2 MG/2ML IJ SOLN
INTRAMUSCULAR | Status: AC | PRN
  Administered 2024-01-21 (×2): 1 mg via INTRAVENOUS

## 2024-01-21 MED ORDER — IOHEXOL 300 MG/ML  SOLN
100.0000 mL | Freq: Once | INTRAMUSCULAR | Status: AC | PRN
  Administered 2024-01-21: 37 mL via INTRA_ARTERIAL

## 2024-01-21 MED ORDER — FENTANYL CITRATE (PF) 100 MCG/2ML IJ SOLN
INTRAMUSCULAR | Status: AC
  Filled 2024-01-21: qty 2

## 2024-01-21 NOTE — Discharge Instructions (Addendum)
 Femoral Site Care  What can I expect after the procedure? After the procedure, it is common to have: Bruising that usually fades within 1-2 weeks. Tenderness at the site.  Follow these instructions at home: Wound care Follow instructions from your health care provider about how to take care of your insertion site. Make sure you: Wash your hands with soap and water before you change your bandage (dressing). If soap and water are not available, use hand sanitizer. Change your dressing as told by your health care provider. Leave stitches (sutures), skin glue, or adhesive strips in place. These skin closures may need to stay in place for 2 weeks or longer. If adhesive strip edges start to loosen and curl up, you may trim the loose edges. Do not remove adhesive strips completely unless your health care provider tells you to do that. Do not take baths, swim, or use a hot tub until your health care provider approves. You may shower 24-48 hours after the procedure or as told by your health care provider. Gently wash the site with plain soap and water. Pat the area dry with a clean towel. Do not rub the site. This may cause bleeding. Do not apply powder or lotion to the site. Keep the site clean and dry. Check your femoral site every day for signs of infection. Check for: Redness, swelling, or pain. Fluid or blood. Warmth. Pus or a bad smell. Activity For the first 2-3 days after your procedure, or as long as directed: Avoid climbing stairs as much as possible. Do not squat. Do not lift anything that is heavier than 10 lb (4.5 kg), or the limit that you are told, until your health care provider says that it is safe. Rest as directed. Avoid sitting for a long time without moving. Get up to take short walks every 1-2 hours. Do not drive for 24 hours if you were given a medicine to help you relax (sedative).  General instructions Take over-the-counter and prescription medicines only as told by  your health care provider. Keep all follow-up visits as told by your health care provider. This is important.  Contact a health care provider if you have: A fever or chills. You have redness, swelling, or pain around your insertion site.  Get help right away if: The catheter insertion area swells very fast. You pass out. You suddenly start to sweat or your skin gets clammy. The catheter insertion area is bleeding, and the bleeding does not stop when you hold steady pressure on the area. The area near or just beyond the catheter insertion site becomes pale, cool, tingly, or numb.   Moderate Conscious Sedation-Care After  This sheet gives you information about how to care for yourself after your procedure. Your health care provider may also give you more specific instructions. If you have problems or questions, contact your health care provider.  After the procedure, it is common to have: Sleepiness for several hours. Impaired judgment for several hours. Difficulty with balance. Vomiting if you eat too soon.  Follow these instructions at home:  Rest. Do not participate in activities where you could fall or become injured. Do not drive or use machinery. Do not drink alcohol. Do not take sleeping pills or medicines that cause drowsiness. Do not make important decisions or sign legal documents. Do not take care of children on your own.  Eating and drinking Follow the diet recommended by your health care provider. Drink enough fluid to keep your urine pale yellow.  If you vomit: Drink water, juice, or soup when you can drink without vomiting. Make sure you have little or no nausea before eating solid foods.  General instructions Take over-the-counter and prescription medicines only as told by your health care provider. Have a responsible adult stay with you for the time you are told. It is important to have someone help care for you until you are awake and alert. Do not  smoke. Keep all follow-up visits as told by your health care provider. This is important.  Contact a health care provider if: You are still sleepy or having trouble with balance after 24 hours. You feel light-headed. You keep feeling nauseous or you keep vomiting. You develop a rash. You have a fever. You have redness or swelling around the IV site.  Get help right away if: You have trouble breathing. You have new-onset confusion at home.  This information is not intended to replace advice given to you by your health care provider. Make sure you discuss any questions you have with your healthcare provider.  Please call Interventional Radiology clinic [phone] with any questions or concerns.  You may remove your dressing and shower tomorrow.

## 2024-01-21 NOTE — Progress Notes (Signed)
 Call to patient's designated contact - Daughter Trula Ore & son in law - Tally Due. Discussed timeframes for post procedure & discharge instructions. Verbalized understanding. Aware of discharge time and where to pick up patient.

## 2024-01-21 NOTE — Procedures (Signed)
 Interventional Radiology Procedure Note  Procedure: Right renal arteriography, embolization of arterial supply to upper pole angiomyolipoma   Complications: None  Estimated Blood Loss: < 10 mL  Findings: Discrete upper pole vascular AML supplied by two subsegmental upper pole branches of right renal artery.  Both branches embolized via microcatheter with 300-500 micron Embospheres. Good occlusion of flow in tumor.\  Closure of right CFA: AngioSeal   Dalayah Deahl T. Fredia Sorrow, M.D Pager:  [phone]

## 2024-01-28 DIAGNOSIS — Z1212 Encounter for screening for malignant neoplasm of rectum: Secondary | ICD-10-CM | POA: Diagnosis not present

## 2024-01-28 DIAGNOSIS — I10 Essential (primary) hypertension: Secondary | ICD-10-CM | POA: Diagnosis not present

## 2024-01-28 DIAGNOSIS — E1151 Type 2 diabetes mellitus with diabetic peripheral angiopathy without gangrene: Secondary | ICD-10-CM | POA: Diagnosis not present

## 2024-01-28 DIAGNOSIS — E782 Mixed hyperlipidemia: Secondary | ICD-10-CM | POA: Diagnosis not present

## 2024-01-28 DIAGNOSIS — D509 Iron deficiency anemia, unspecified: Secondary | ICD-10-CM | POA: Diagnosis not present

## 2024-02-02 ENCOUNTER — Encounter (HOSPITAL_COMMUNITY): Payer: Self-pay | Admitting: Radiology

## 2024-02-02 ENCOUNTER — Encounter (HOSPITAL_COMMUNITY): Payer: Self-pay | Admitting: Interventional Radiology

## 2024-02-03 DIAGNOSIS — I10 Essential (primary) hypertension: Secondary | ICD-10-CM | POA: Diagnosis not present

## 2024-02-03 DIAGNOSIS — Z1339 Encounter for screening examination for other mental health and behavioral disorders: Secondary | ICD-10-CM | POA: Diagnosis not present

## 2024-02-03 DIAGNOSIS — G4733 Obstructive sleep apnea (adult) (pediatric): Secondary | ICD-10-CM | POA: Diagnosis not present

## 2024-02-03 DIAGNOSIS — Z1331 Encounter for screening for depression: Secondary | ICD-10-CM | POA: Diagnosis not present

## 2024-02-03 DIAGNOSIS — E782 Mixed hyperlipidemia: Secondary | ICD-10-CM | POA: Diagnosis not present

## 2024-02-03 DIAGNOSIS — Z0000 Encounter for general adult medical examination without abnormal findings: Secondary | ICD-10-CM | POA: Diagnosis not present

## 2024-02-03 DIAGNOSIS — E1151 Type 2 diabetes mellitus with diabetic peripheral angiopathy without gangrene: Secondary | ICD-10-CM | POA: Diagnosis not present

## 2024-02-03 DIAGNOSIS — J449 Chronic obstructive pulmonary disease, unspecified: Secondary | ICD-10-CM | POA: Diagnosis not present

## 2024-02-03 DIAGNOSIS — I480 Paroxysmal atrial fibrillation: Secondary | ICD-10-CM | POA: Diagnosis not present

## 2024-02-03 DIAGNOSIS — I739 Peripheral vascular disease, unspecified: Secondary | ICD-10-CM | POA: Diagnosis not present

## 2024-03-10 ENCOUNTER — Telehealth: Payer: Self-pay

## 2024-03-10 ENCOUNTER — Other Ambulatory Visit: Payer: Self-pay

## 2024-03-10 DIAGNOSIS — K869 Disease of pancreas, unspecified: Secondary | ICD-10-CM

## 2024-03-10 NOTE — Telephone Encounter (Signed)
 Left message on machine to call back

## 2024-03-10 NOTE — Telephone Encounter (Signed)
 EUS has been set up for 04/24/24 at 345 pm at [facility] with GM

## 2024-03-10 NOTE — Telephone Encounter (Signed)
-----   Message from [facility] sent at 03/10/2024  6:30 AM EDT ----- Regarding: Follow-up Veronica Vanderheyden, Do see a recall in the system for this patient? Dr. Venice Gillis wanted her to have an EUS FNA this year. If not go ahead and get her scheduled. Thanks. GM

## 2024-03-13 NOTE — Telephone Encounter (Signed)
 EUS scheduled, pt instructed and medications reviewed.  Patient instructions mailed to home.  Patient to call with any questions or concerns.

## 2024-03-13 NOTE — Telephone Encounter (Signed)
 Patient stated that she was returning your call. Patient is requesting a call back. Please advise.

## 2024-03-16 ENCOUNTER — Ambulatory Visit (HOSPITAL_BASED_OUTPATIENT_CLINIC_OR_DEPARTMENT_OTHER)
Admission: RE | Admit: 2024-03-16 | Discharge: 2024-03-16 | Disposition: A | Discharge status: Home / Self Care | Source: Ambulatory Visit | Attending: Internal Medicine | Admitting: Internal Medicine

## 2024-03-16 DIAGNOSIS — I779 Disorder of arteries and arterioles, unspecified: Secondary | ICD-10-CM | POA: Insufficient documentation

## 2024-03-16 DIAGNOSIS — I77810 Thoracic aortic ectasia: Secondary | ICD-10-CM | POA: Diagnosis not present

## 2024-03-16 DIAGNOSIS — I77819 Aortic ectasia, unspecified site: Secondary | ICD-10-CM | POA: Diagnosis not present

## 2024-03-16 DIAGNOSIS — Z01818 Encounter for other preprocedural examination: Secondary | ICD-10-CM | POA: Diagnosis not present

## 2024-03-16 DIAGNOSIS — J432 Centrilobular emphysema: Secondary | ICD-10-CM | POA: Diagnosis not present

## 2024-03-16 LAB — POCT I-STAT CREATININE: Creatinine, Ser: 0.7 mg/dL (ref 0.44–1.00)

## 2024-03-16 MED ORDER — IOHEXOL 350 MG/ML SOLN
100.0000 mL | Freq: Once | INTRAVENOUS | Status: AC | PRN
  Administered 2024-03-16: 100 mL via INTRAVENOUS

## 2024-03-21 ENCOUNTER — Encounter: Payer: Self-pay | Admitting: Internal Medicine

## 2024-03-23 NOTE — Telephone Encounter (Signed)
-----   Message from [place] sent at 03/20/2024  5:29 PM EDT ----- Aorta remains unchanged in size Angiomyolipoma remains unchanged  There is a renal mass on R  Not fully imaged  Per radiology would recomm multiphase MRI to assess further

## 2024-03-23 NOTE — Telephone Encounter (Signed)
 I called the pt to talk with her..... there are too mane messages and the pt needs to talk with someone about these results... I LM that I will look into MRI recommended by Dr Avanell Bob and I will call her back.

## 2024-03-27 ENCOUNTER — Telehealth: Payer: Self-pay

## 2024-03-27 DIAGNOSIS — N2889 Other specified disorders of kidney and ureter: Secondary | ICD-10-CM

## 2024-03-27 NOTE — Telephone Encounter (Signed)
-----   Message from [place] sent at 03/20/2024  5:29 PM EDT ----- Aorta remains unchanged in size Angiomyolipoma remains unchanged  There is a renal mass on R  Not fully imaged  Per radiology would recomm multiphase MRI to assess further

## 2024-03-27 NOTE — Addendum Note (Signed)
 Addended by: Alanna Alley on: 03/27/2024 11:04 AM   Modules accepted: Orders

## 2024-03-27 NOTE — Telephone Encounter (Signed)
 Patient says she's returning another call from RN.

## 2024-03-27 NOTE — Telephone Encounter (Signed)
 Called pt. She states she received a call from [place]. She also states "idk why she called back. When we hung up I thought we were okay. If she needs me she can call me back."

## 2024-03-27 NOTE — Telephone Encounter (Addendum)
 Message carried over from her result notes.. I have left a message for the pt a few times.   Ordering her MRI for the renal mass. Pt needs to be fasting 4 hours prior.   I talked with the pt and she says she had an "embolization" of her kidney so she asks that I send all of this to her Urologist.. Dr Barkley Boot so we are all on the same "page" top be sure he is okay with her needing this.   Message sent.

## 2024-03-31 ENCOUNTER — Encounter: Payer: Self-pay | Admitting: Urology

## 2024-04-11 DIAGNOSIS — E1151 Type 2 diabetes mellitus with diabetic peripheral angiopathy without gangrene: Secondary | ICD-10-CM | POA: Diagnosis not present

## 2024-04-11 DIAGNOSIS — J209 Acute bronchitis, unspecified: Secondary | ICD-10-CM | POA: Diagnosis not present

## 2024-04-11 DIAGNOSIS — R058 Other specified cough: Secondary | ICD-10-CM | POA: Diagnosis not present

## 2024-04-11 DIAGNOSIS — J449 Chronic obstructive pulmonary disease, unspecified: Secondary | ICD-10-CM | POA: Diagnosis not present

## 2024-04-18 ENCOUNTER — Encounter (HOSPITAL_COMMUNITY): Payer: Self-pay | Admitting: Gastroenterology

## 2024-04-18 ENCOUNTER — Telehealth: Payer: Self-pay

## 2024-04-18 NOTE — Telephone Encounter (Signed)
 Procedure:ultrasound Procedure date: 04/24/24 Procedure location: [facility] Arrival Time: 215pm Spoke with the patient Y/N: y Any prep concerns? n  Has the patient obtained the prep from the pharmacy ? n Do you have a care partner and transportation: y Any additional concerns? n

## 2024-04-21 DIAGNOSIS — H35373 Puckering of macula, bilateral: Secondary | ICD-10-CM | POA: Diagnosis not present

## 2024-04-21 DIAGNOSIS — H524 Presbyopia: Secondary | ICD-10-CM | POA: Diagnosis not present

## 2024-04-21 DIAGNOSIS — E119 Type 2 diabetes mellitus without complications: Secondary | ICD-10-CM | POA: Diagnosis not present

## 2024-04-21 DIAGNOSIS — H3554 Dystrophies primarily involving the retinal pigment epithelium: Secondary | ICD-10-CM | POA: Diagnosis not present

## 2024-04-21 DIAGNOSIS — H04123 Dry eye syndrome of bilateral lacrimal glands: Secondary | ICD-10-CM | POA: Diagnosis not present

## 2024-04-21 DIAGNOSIS — H35361 Drusen (degenerative) of macula, right eye: Secondary | ICD-10-CM | POA: Diagnosis not present

## 2024-04-24 ENCOUNTER — Ambulatory Visit (HOSPITAL_COMMUNITY): Admitting: Anesthesiology

## 2024-04-24 ENCOUNTER — Encounter (HOSPITAL_COMMUNITY): Payer: Self-pay | Admitting: Gastroenterology

## 2024-04-24 ENCOUNTER — Other Ambulatory Visit: Payer: Self-pay

## 2024-04-24 ENCOUNTER — Ambulatory Visit (HOSPITAL_COMMUNITY)
Admission: RE | Admit: 2024-04-24 | Discharge: 2024-04-24 | Disposition: A | Discharge status: Home / Self Care | Attending: Gastroenterology | Admitting: Gastroenterology

## 2024-04-24 ENCOUNTER — Encounter (HOSPITAL_COMMUNITY)
Admission: RE | Disposition: A | Discharge status: Home / Self Care | Payer: Self-pay | Source: Home / Self Care | Attending: Gastroenterology

## 2024-04-24 ENCOUNTER — Telehealth: Payer: Self-pay

## 2024-04-24 DIAGNOSIS — K869 Disease of pancreas, unspecified: Secondary | ICD-10-CM

## 2024-04-24 DIAGNOSIS — E66813 Obesity, class 3: Secondary | ICD-10-CM | POA: Diagnosis not present

## 2024-04-24 DIAGNOSIS — K449 Diaphragmatic hernia without obstruction or gangrene: Secondary | ICD-10-CM

## 2024-04-24 DIAGNOSIS — G4733 Obstructive sleep apnea (adult) (pediatric): Secondary | ICD-10-CM | POA: Insufficient documentation

## 2024-04-24 DIAGNOSIS — K862 Cyst of pancreas: Secondary | ICD-10-CM | POA: Diagnosis not present

## 2024-04-24 DIAGNOSIS — K8050 Calculus of bile duct without cholangitis or cholecystitis without obstruction: Secondary | ICD-10-CM | POA: Diagnosis not present

## 2024-04-24 DIAGNOSIS — K2210 Ulcer of esophagus without bleeding: Secondary | ICD-10-CM | POA: Insufficient documentation

## 2024-04-24 DIAGNOSIS — K8020 Calculus of gallbladder without cholecystitis without obstruction: Secondary | ICD-10-CM

## 2024-04-24 DIAGNOSIS — K8070 Calculus of gallbladder and bile duct without cholecystitis without obstruction: Secondary | ICD-10-CM | POA: Diagnosis not present

## 2024-04-24 DIAGNOSIS — K208 Other esophagitis without bleeding: Secondary | ICD-10-CM | POA: Diagnosis not present

## 2024-04-24 DIAGNOSIS — J449 Chronic obstructive pulmonary disease, unspecified: Secondary | ICD-10-CM

## 2024-04-24 DIAGNOSIS — K2289 Other specified disease of esophagus: Secondary | ICD-10-CM | POA: Diagnosis not present

## 2024-04-24 DIAGNOSIS — I899 Noninfective disorder of lymphatic vessels and lymph nodes, unspecified: Secondary | ICD-10-CM | POA: Diagnosis not present

## 2024-04-24 DIAGNOSIS — K2960 Other gastritis without bleeding: Secondary | ICD-10-CM | POA: Diagnosis not present

## 2024-04-24 DIAGNOSIS — Z87891 Personal history of nicotine dependence: Secondary | ICD-10-CM | POA: Insufficient documentation

## 2024-04-24 DIAGNOSIS — K8689 Other specified diseases of pancreas: Secondary | ICD-10-CM | POA: Diagnosis not present

## 2024-04-24 DIAGNOSIS — I10 Essential (primary) hypertension: Secondary | ICD-10-CM | POA: Insufficient documentation

## 2024-04-24 DIAGNOSIS — K838 Other specified diseases of biliary tract: Secondary | ICD-10-CM

## 2024-04-24 DIAGNOSIS — I4892 Unspecified atrial flutter: Secondary | ICD-10-CM | POA: Diagnosis not present

## 2024-04-24 DIAGNOSIS — Z6841 Body Mass Index (BMI) 40.0 and over, adult: Secondary | ICD-10-CM | POA: Diagnosis not present

## 2024-04-24 DIAGNOSIS — I2510 Atherosclerotic heart disease of native coronary artery without angina pectoris: Secondary | ICD-10-CM | POA: Diagnosis not present

## 2024-04-24 DIAGNOSIS — K2970 Gastritis, unspecified, without bleeding: Secondary | ICD-10-CM

## 2024-04-24 DIAGNOSIS — E119 Type 2 diabetes mellitus without complications: Secondary | ICD-10-CM | POA: Insufficient documentation

## 2024-04-24 DIAGNOSIS — K3189 Other diseases of stomach and duodenum: Secondary | ICD-10-CM | POA: Diagnosis not present

## 2024-04-24 DIAGNOSIS — K8010 Calculus of gallbladder with chronic cholecystitis without obstruction: Secondary | ICD-10-CM | POA: Insufficient documentation

## 2024-04-24 DIAGNOSIS — I4891 Unspecified atrial fibrillation: Secondary | ICD-10-CM | POA: Diagnosis not present

## 2024-04-24 HISTORY — PX: ESOPHAGOGASTRODUODENOSCOPY: SHX5428

## 2024-04-24 HISTORY — PX: EUS: SHX5427

## 2024-04-24 HISTORY — PX: FINE NEEDLE ASPIRATION: SHX6590

## 2024-04-24 LAB — HEPATIC FUNCTION PANEL
ALT: 14 U/L (ref 0–44)
AST: 22 U/L (ref 15–41)
Albumin: 3.1 g/dL — ABNORMAL LOW (ref 3.5–5.0)
Alkaline Phosphatase: 53 U/L (ref 38–126)
Bilirubin, Direct: 0.2 mg/dL (ref 0.0–0.2)
Indirect Bilirubin: 0.9 mg/dL (ref 0.3–0.9)
Total Bilirubin: 1.1 mg/dL (ref 0.0–1.2)
Total Protein: 5.8 g/dL — ABNORMAL LOW (ref 6.5–8.1)

## 2024-04-24 LAB — GLUCOSE, CAPILLARY: Glucose-Capillary: 87 mg/dL (ref 70–99)

## 2024-04-24 SURGERY — EGD (ESOPHAGOGASTRODUODENOSCOPY)
Anesthesia: Monitor Anesthesia Care

## 2024-04-24 MED ORDER — DEXMEDETOMIDINE HCL IN NACL 80 MCG/20ML IV SOLN
INTRAVENOUS | Status: DC | PRN
  Administered 2024-04-24: 8 ug via INTRAVENOUS

## 2024-04-24 MED ORDER — PROPOFOL 500 MG/50ML IV EMUL
INTRAVENOUS | Status: DC | PRN
  Administered 2024-04-24: 175 ug/kg/min via INTRAVENOUS

## 2024-04-24 MED ORDER — OMEPRAZOLE 40 MG PO CPDR: 40.0000 mg | DELAYED_RELEASE_CAPSULE | Freq: Two times a day (BID) | ORAL | 12 refills | Status: DC

## 2024-04-24 MED ORDER — AMOXICILLIN-POT CLAVULANATE 875-125 MG PO TABS
1.0000 | ORAL_TABLET | Freq: Two times a day (BID) | ORAL | 0 refills | Status: AC
Start: 2024-04-24 — End: 2024-04-27

## 2024-04-24 MED ORDER — PHENYLEPHRINE 80 MCG/ML (10ML) SYRINGE FOR IV PUSH (FOR BLOOD PRESSURE SUPPORT)
PREFILLED_SYRINGE | INTRAVENOUS | Status: DC | PRN
  Administered 2024-04-24: 80 ug via INTRAVENOUS
  Administered 2024-04-24: 40 ug via INTRAVENOUS

## 2024-04-24 MED ORDER — SODIUM CHLORIDE 0.9 % IV SOLN
INTRAVENOUS | Status: DC | PRN
  Administered 2024-04-24: 500 mL via INTRAMUSCULAR

## 2024-04-24 MED ORDER — CIPROFLOXACIN IN D5W 400 MG/200ML IV SOLN
INTRAVENOUS | Status: DC | PRN
Start: 2024-04-24 — End: 2024-04-24
  Administered 2024-04-24: 400 mg via INTRAVENOUS

## 2024-04-24 MED ORDER — SODIUM CHLORIDE 0.9 % IV SOLN: INTRAVENOUS | Status: DC

## 2024-04-24 MED ORDER — PROPOFOL 10 MG/ML IV BOLUS
INTRAVENOUS | Status: DC | PRN
Start: 2024-04-24 — End: 2024-04-24
  Administered 2024-04-24: 50 mg via INTRAVENOUS

## 2024-04-24 MED ORDER — OMEPRAZOLE 40 MG PO CPDR: 40.0000 mg | DELAYED_RELEASE_CAPSULE | Freq: Two times a day (BID) | ORAL | 12 refills | Status: AC

## 2024-04-24 MED ORDER — AMOXICILLIN-POT CLAVULANATE 875-125 MG PO TABS: 1.0000 | ORAL_TABLET | Freq: Two times a day (BID) | ORAL | 0 refills | Status: DC

## 2024-04-24 MED ORDER — CIPROFLOXACIN IN D5W 400 MG/200ML IV SOLN
INTRAVENOUS | Status: AC
  Filled 2024-04-24: qty 200

## 2024-04-24 NOTE — Progress Notes (Signed)
 Patient had slight itching sensation with ciprofloxacin completing. This was discontinued. Patient will be sent Augmentin 875 mg twice daily x 3 days to decrease risk of post interventional infection.   Yong Henle, MD [facility] Office # [phone]

## 2024-04-24 NOTE — H&P (Signed)
 GASTROENTEROLOGY PROCEDURE H&P NOTE   Primary Care Physician: Bertha Broad, MD  HPI: Veronica Hansen is a 78 y.o. female who presents for EGD/history of weight pancreatic cyst.  Past Medical History:  Diagnosis Date   A-fib (HCC)    A-fib (HCC) 07/2018   treated with ablation procedure in October 2019   Allergic rhinitis    Anemia    Arthritis    joints hurt   ASCVD (arteriosclerotic cardiovascular disease)    Atypical chest pain    cardiology consult showed an echocardiogram with left ventricular hypertrophy normal left ventricular function.   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   COPD (chronic obstructive pulmonary disease) (HCC)    Depression    husband died of Covid 05/07/2020   Diabetes mellitus    type 2, no longer takes medication    Diabetes mellitus (HCC)    Dyspnea    GERD (gastroesophageal reflux disease)    History of blood transfusion    HTN (hypertension)    Hypercholesterolemia    Hyperlipidemia    Hypertension    Incontinence    Iron deficiency anemia    Lower esophageal sphincter, relaxation    Morbid obesity (HCC)    OSA (obstructive sleep apnea)    OSA on CPAP 03/07/2014   OSA on CPAP    Proteinuria 2001   Psoriasis    RLL pneumonia 06/2016   Runny nose    seasonal - poss allergies   Sinus problem    SOB (shortness of breath)    Tobacco use    Stopped in May 08, 2003   Past Surgical History:  Procedure Laterality Date   A-FLUTTER ABLATION N/A 08/19/2018   Procedure: A-FLUTTER ABLATION;  Surgeon: Tammie Fall, MD;  Location: [facility];  Service: Cardiovascular;  Laterality: N/A;   ATRIAL FLUTTER ABLATION     BARIATRIC SURGERY  May 07, 2012   dental implants  11/16/2009   IR EMBO TUMOR ORGAN ISCHEMIA INFARCT INC GUIDE ROADMAPPING  01/21/2024   IR RADIOLOGIST EVAL & MGMT  12/27/2023   IR RENAL SUPRASEL UNI S&I MOD SED  01/21/2024   IR RENAL SUPRASEL UNI S&I MOD SED  01/21/2024   IR US  GUIDE VASC ACCESS RIGHT  01/21/2024   LARYNX SURGERY      polyp removed   left breast bx was fibrocystic change     left cataract extraction and lens implantation  01/11/2019   ORIF ULNAR FRACTURE Right 02/15/2021   Procedure: OPEN REDUCTION INTERNAL FIXATION (ORIF) ULNAR FRACTURE and distal radius open reduction and internal fixation and repair;  Surgeon: Arvil Birks, MD;  Location: [facility];  Service: Orthopedics;  Laterality: Right;  with IV sedation   Right TKR  May 07, 2006   TOTAL HIP ARTHROPLASTY Right 06/03/2023   Procedure: TOTAL HIP ARTHROPLASTY ANTERIOR APPROACH;  Surgeon: Claiborne Crew, MD;  Location: [facility];  Service: Orthopedics;  Laterality: Right;   TOTAL KNEE ARTHROPLASTY  11/17/2003   left   TOTAL KNEE ARTHROPLASTY  11/16/2005   right   Current Facility-Administered Medications  Medication Dose Route Frequency Provider Last Rate Last Admin   0.9 %  sodium chloride  infusion   Intravenous Continuous Mansouraty, Geovanie Winnett Jr., MD       0.9 %  sodium chloride  infusion    Continuous PRN Mansouraty, Albino Alu., MD 10 mL/hr at 04/24/24 1120 Continued from Pre-op at 04/24/24 1120    Current Facility-Administered Medications:    0.9 %  sodium chloride  infusion, , Intravenous,  Continuous, Mansouraty, Albino Alu., MD   0.9 %  sodium chloride  infusion, , , Continuous PRN, Mansouraty, Albino Alu., MD, Last Rate: 10 mL/hr at 04/24/24 1120, Continued from Pre-op at 04/24/24 1120 Allergies  Allergen Reactions   Orlistat Diarrhea    Other reaction(s): severe diarrhea   Phentermine Hcl     Other reaction(s): elevated BP   Family History  Problem Relation Age of Onset   Diabetes Father    Diabetes Brother    Colon cancer Neg Hx    Rectal cancer Neg Hx    Stomach cancer Neg Hx    Social History   Socioeconomic History   Marital status: Widowed    Spouse name: Not on file   Number of children: 3   Years of education: Not on file   Highest education level: Bachelor's degree (e.g., BA, AB, BS)  Occupational History   Occupation: Retired   Tobacco Use   Smoking status: Former    Current packs/day: 0.00    Types: Cigarettes    Start date: 08/19/1980    Quit date: 08/19/2000    Years since quitting: 23.6   Smokeless tobacco: Never  Vaping Use   Vaping status: Never Used  Substance and Sexual Activity   Alcohol use: No    Comment: Rare   Drug use: No   Sexual activity: Not Currently    Comment: Quit ~ 2007  Other Topics Concern   Not on file  Social History Narrative   Not on file   Social Drivers of Health   Financial Resource Strain: Not on file  Food Insecurity: No Food Insecurity (06/03/2023)   Hunger Vital Sign    Worried About Running Out of Food in the Last Year: Never true    Ran Out of Food in the Last Year: Never true  Transportation Needs: No Transportation Needs (06/03/2023)   PRAPARE - Administrator, Civil Service (Medical): No    Lack of Transportation (Non-Medical): No  Physical Activity: Not on file  Stress: Not on file  Social Connections: Not on file  Intimate Partner Violence: Not At Risk (06/03/2023)   Humiliation, Afraid, Rape, and Kick questionnaire    Fear of Current or Ex-Partner: No    Emotionally Abused: No    Physically Abused: No    Sexually Abused: No    Physical Exam: Today's Vitals   04/24/24 1046  BP: (!) 143/96  Pulse: 65  Resp: 12  Temp: (!) 97.4 F (36.3 C)  TempSrc: Temporal  SpO2: 98%  Weight: 112.5 kg  Height: 5\' 6"  (1.676 m)  PainSc: 0-No pain   Body mass index is 40.03 kg/m. GEN: NAD EYE: Sclerae anicteric ENT: MMM CV: Non-tachycardic GI: Soft, NT/ND NEURO:  Alert & Oriented x 3  Lab Results: No results for input(s): "WBC", "HGB", "HCT", "PLT" in the last 72 hours. BMET No results for input(s): "NA", "K", "CL", "CO2", "GLUCOSE", "BUN", "CREATININE", "CALCIUM" in the last 72 hours. LFT No results for input(s): "PROT", "ALBUMIN", "AST", "ALT", "ALKPHOS", "BILITOT", "BILIDIR", "IBILI" in the last 72 hours. PT/INR No results for input(s):  "LABPROT", "INR" in the last 72 hours.   Impression / Plan: This is a 78 y.o.female who presents for EGD/history of weight pancreatic cyst.  The risks of an EUS including intestinal perforation, bleeding, infection, aspiration, and medication effects were discussed as was the possibility it may not give a definitive diagnosis if a biopsy is performed.  When a biopsy of the pancreas is  done as part of the EUS, there is an additional risk of pancreatitis at the rate of about 1-2%.  It was explained that procedure related pancreatitis is typically mild, although it can be severe and even life threatening, which is why we do not perform random pancreatic biopsies and only biopsy a lesion/area we feel is concerning enough to warrant the risk.   The risks and benefits of endoscopic evaluation/treatment were discussed with the patient and/or family; these include but are not limited to the risk of perforation, infection, bleeding, missed lesions, lack of diagnosis, severe illness requiring hospitalization, as well as anesthesia and sedation related illnesses.  The patient's history has been reviewed, patient examined, no change in status, and deemed stable for procedure.  The patient and/or family is agreeable to proceed.    Yong Henle, MD [facility] Office # [phone]

## 2024-04-24 NOTE — Op Note (Addendum)
 [facility] Patient Name: Veronica Hansen Procedure Date: 04/24/2024 MRN: [id number] Attending MD: Yong Henle , MD, [id number] Date of Birth: Mar 29, 1946 CSN: [id number] Age: 78 Admit Type: Ambulatory Procedure:                Upper EUS Indications:              Pancreatic cyst on MRCP Providers:                Yong Henle, MD, Ambrose Junk, RN, Gabino Joe, Technician, Marden Shaggy, RN Referring MD:             Yong Henle, MD Medicines:                Monitored Anesthesia Care, Cipro 400 mg IV Complications:            No immediate complications. Estimated Blood Loss:     Estimated blood loss was minimal. Procedure:                Pre-Anesthesia Assessment:                           - Prior to the procedure, a History and Physical                            was performed, and patient medications and                            allergies were reviewed. The patient's tolerance of                            previous anesthesia was also reviewed. The risks                            and benefits of the procedure and the sedation                            options and risks were discussed with the patient.                            All questions were answered, and informed consent                            was obtained. Prior Anticoagulants: The patient has                            taken no anticoagulant or antiplatelet agents. ASA                            Grade Assessment: III - A patient with severe                            systemic disease. After reviewing the risks and  benefits, the patient was deemed in satisfactory                            condition to undergo the procedure.                           After obtaining informed consent, the endoscope was                            passed under direct vision. Throughout the                            procedure, the patient's blood  pressure, pulse, and                            oxygen saturations were monitored continuously. The                            [id number]-[id number] ([id number]) Olympus endoscope was introduced                            through the mouth, and advanced to the second part                            of duodenum. The [id number]-[id number] ([id number]) Olympus                            duodenoscope was introduced through the mouth, and                            advanced to the area of papilla. After obtaining                            informed consent, the endoscope was passed under                            direct vision. Throughout the procedure, the                            patient's blood pressure, pulse, and oxygen                            saturations were monitored continuously. The                            [id number] ([id number]) Olympus linear ultrasound scope                            was introduced through the mouth, and advanced to                            the duodenum for ultrasound examination from the  stomach and duodenum. The upper EUS was                            accomplished without difficulty. The patient                            tolerated the procedure. Scope In: Scope Out: Findings:      ENDOSCOPIC FINDING: :      No gross lesions were noted in the proximal esophagus and in the mid       esophagus.      LA Grade C (one or more mucosal breaks continuous between tops of 2 or       more mucosal folds, less than 75% circumference) esophagitis with no       bleeding was found in the distal esophagus.      The Z-line was irregular and was found 38 cm from the incisors.      A 5 cm hiatal hernia was present.      Patchy mildly erythematous mucosa without bleeding was found in the       entire examined stomach. Biopsies were taken with a cold forceps for       histology and Helicobacter pylori testing.      No gross lesions were noted in the duodenal bulb, in the  first portion       of the duodenum and in the second portion of the duodenum.      The major papilla was normal.      ENDOSONOGRAPHIC FINDING: :      An anechoic lesion suggestive of a cyst was identified in the pancreatic       tail. It is not in obvious communication with the pancreatic duct. The       lesion measured 20 mm by 19 mm in maximal cross-sectional diameter.       There were 3 compartments thinly septated. The outer wall of the lesion       was thin. There was no associated mass. There was no internal debris       within the fluid-filled cavity. Diagnostic needle aspiration for fluid       was performed. Color Doppler imaging was utilized prior to needle       puncture to confirm a lack of significant vascular structures within the       needle path. One pass was made with the 22 gauge needle using a       transgastric approach. A stylet was used. The amount of fluid collected       was 2 mL. The fluid was cloudy, serosanguinous and watery. Sample(s)       were sent for chemistry, amylase concentration and CEA.      Pancreatic parenchymal abnormalities were noted in the entire pancreas.       These consisted of hyperechoic strands.      The diameter of the main pancreatic duct (MPD) measured:      - HOP 2.7 mm (head of pancreas)      - NOP 2.0 mm (neck of pancreas)      - BOP 2.2 -> 12.7 mm (body of the pancreas)      - TOP 1.0 mm (tail of the pancreas).      One stone was visualized endosonographically in the gallbladder as well       as a significant amount  of biliary sludge. The stone measured 25 mm in       greatest dimension. The stone was round. It was hyperechoic and       characterized by shadowing.      One stone was visualized endosonographically in the common bile duct.       The stone measured 3.5 mm in greatest dimension. The stone was round. It       was hyperechoic and characterized by shadowing. The upstream bile duct       was slightly prominent at 6.4 mm  with downstream bile duct at 2.3 mm.      Endosonographic imaging of the ampulla showed no intramural       (subepithelial) lesion.      Endosonographic imaging in the visualized portion of the liver showed no       mass.      No malignant-appearing lymph nodes were visualized in the celiac region       (level 20), peripancreatic region and porta hepatis region.      The celiac region was visualized. Impression:               EGD impression:                           - No gross lesions in the proximal esophagus and in                            the mid esophagus.                           - LA Grade C erosive esophagitis with no bleeding.                            Noted distally                           - Z-line irregular, 38 cm from the incisors.                           - 5 cm hiatal hernia.                           - Erythematous mucosa in the stomach. Biopsied.                           - No gross lesions in the duodenal bulb, in the                            first portion of the duodenum and in the second                            portion of the duodenum.                           - Normal major papilla.                           EUS impression:                           -  A cystic lesion was seen in the pancreatic tail.                            Cytology results are pending. 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 Fine                            needle aspiration for fluid performed.                           - Pancreatic parenchymal abnormalities consisting                            of hyperechoic strands were noted in the entire                            pancreas.                           - Main pancreatic duct (MPD) diameter was measured.                            Endosonographically, the MPD had a normal                            appearance.                            - One stone and significant biliary sludge was                            visualized endosonographically in the gallbladder.                           - One 3.5 mm stone was visualized                            endosonographically in the common bile duct.                           - No malignant-appearing lymph nodes were                            visualized in the celiac region (level 20),                            peripancreatic region and porta hepatis region. Moderate Sedation:      Not Applicable - Patient had care per Anesthesia. Recommendation:           - The patient will be observed post-procedure,  until all discharge criteria are met.                           - Discharge patient to home.                           - Patient has a contact number available for                            emergencies. The signs and symptoms of potential                            delayed complications were discussed with the                            patient. Return to normal activities tomorrow.                            Written discharge instructions were provided to the                            patient.                           - Low fat diet.                           - Observe patient's clinical course.                           - Await path results.                           - Ciprofloxacin 500 mg twice daily for 3 days to                            decrease risk of post interventional infection.                           - Initiate omeprazole 40 mg twice daily.                           - EGD in 4 months to ensure healing of esophagitis                            is recommended, this can be with patient's primary                            gastroenterologist.                           - Patient to be recommended ERCP for biliary stone                            extraction. I will discuss with her primary  gastroenterologist, Dr. Venice Gillis, to see who will have                            earlier availability for ERCP. Recommend surgical                            evaluation for consideration of cholecystectomy as                            well.                           - Monitor for signs/symptoms of pancreatitis,                            bleeding, perforation, and infection. If issues                            please call our number to get further assistance as                            needed.                           - The findings and recommendations were discussed                            with the patient.                           - The findings and recommendations were discussed                            with the designated responsible adult. Procedure Code(s):        --- Professional ---                           [phone], Esophagogastroduodenoscopy, flexible,                            transoral; with transendoscopic ultrasound-guided                            intramural or transmural fine needle                            aspiration/biopsy(s), (includes endoscopic                            ultrasound examination limited to the esophagus,                            stomach or duodenum, and adjacent structures)                           43239, 59, Esophagogastroduodenoscopy, flexible,  transoral; with biopsy, single or multiple Diagnosis Code(s):        --- Professional ---                           K20.80, Other esophagitis without bleeding                           K22.89, Other specified disease of esophagus                           K44.9, Diaphragmatic hernia without obstruction or                            gangrene                           K31.89, Other diseases of stomach and duodenum                           K86.2, Cyst of pancreas                           K86.9, Disease of pancreas, unspecified                           K80.20, Calculus  of gallbladder without                            cholecystitis without obstruction                           K80.50, Calculus of bile duct without cholangitis                            or cholecystitis without obstruction                           I89.9, Noninfective disorder of lymphatic vessels                            and lymph nodes, unspecified CPT copyright 2022 [employer] Association. All rights reserved. The codes documented in this report are preliminary and upon coder review may  be revised to meet current compliance requirements. Yong Henle, MD 04/24/2024 12:56:46 PM Number of Addenda: 0

## 2024-04-24 NOTE — Discharge Instructions (Signed)

## 2024-04-24 NOTE — Telephone Encounter (Signed)
-----   Message from [facility] sent at 04/24/2024  3:16 PM EDT ----- Regarding: Findings of EUS RG, Please see EUS report for details of patient findings. Pancreatic cyst fluid will be pending for the next few weeks. Patient does have evidence of choledocholithiasis. She has a large cholelithiasis as well in her gallbladder. I recommend ERCP. I am going to forward this to you so you can see what you may have available, I do not have anything available right now unless we have cancellations until August. Once you know when you have available, please let me know and then we can work to see if we need to schedule on my schedule.  Onofrio Klemp, In the interim while waiting for Dr. Hobert Lull reply, go ahead and have a referral placed to surgery (Dr. Allen/Dr. Cherlynn Cornfield) for consideration of cholecystectomy in setting of cholelithiasis and choledocholithiasis. Thanks. GM

## 2024-04-24 NOTE — Anesthesia Postprocedure Evaluation (Signed)
 Anesthesia Post Note  Patient: Arely Tinner  Procedure(s) Performed: EGD (ESOPHAGOGASTRODUODENOSCOPY) ULTRASOUND, UPPER GI TRACT, ENDOSCOPIC FINE NEEDLE ASPIRATION     Patient location during evaluation: Endoscopy Anesthesia Type: MAC Level of consciousness: awake and alert Pain management: pain level controlled Vital Signs Assessment: post-procedure vital signs reviewed and stable Respiratory status: spontaneous breathing, nonlabored ventilation and respiratory function stable Cardiovascular status: stable and blood pressure returned to baseline Postop Assessment: no apparent nausea or vomiting Anesthetic complications: no  No notable events documented.  Last Vitals:  Vitals:   04/24/24 1250 04/24/24 1300  BP: 124/70 (!) 162/76  Pulse: (!) 56 (!) 57  Resp: 15 14  Temp:    SpO2: 97% 96%    Last Pain:  Vitals:   04/24/24 1300  TempSrc:   PainSc: 0-No pain                 Martrell Eguia,W. EDMOND

## 2024-04-24 NOTE — Anesthesia Preprocedure Evaluation (Signed)
 Anesthesia Evaluation  Patient identified by MRN, date of birth, ID band Patient awake    Reviewed: Allergy & Precautions, H&P , NPO status , Patient's Chart, lab work & pertinent test results, reviewed documented beta blocker date and time   Airway Mallampati: III  TM Distance: >3 FB Neck ROM: Full    Dental no notable dental hx. (+) Upper Dentures, Edentulous Lower, Dental Advisory Given   Pulmonary sleep apnea and Continuous Positive Airway Pressure Ventilation , COPD, former smoker   Pulmonary exam normal breath sounds clear to auscultation       Cardiovascular hypertension, Pt. on medications and Pt. on home beta blockers  Rhythm:Regular Rate:Normal     Neuro/Psych    Depression    negative neurological ROS     GI/Hepatic Neg liver ROS,GERD  ,,  Endo/Other  diabetes, Type 2, Oral Hypoglycemic Agents  Class 3 obesity  Renal/GU negative Renal ROS  negative genitourinary   Musculoskeletal  (+) Arthritis , Osteoarthritis,    Abdominal   Peds  Hematology  (+) Blood dyscrasia, anemia   Anesthesia Other Findings   Reproductive/Obstetrics negative OB ROS                             Anesthesia Physical Anesthesia Plan  ASA: 3  Anesthesia Plan: MAC   Post-op Pain Management: Minimal or no pain anticipated   Induction: Intravenous  PONV Risk Score and Plan: 2 and Propofol  infusion and Treatment may vary due to age or medical condition  Airway Management Planned: Natural Airway and Simple Face Mask  Additional Equipment:   Intra-op Plan:   Post-operative Plan:   Informed Consent: I have reviewed the patients History and Physical, chart, labs and discussed the procedure including the risks, benefits and alternatives for the proposed anesthesia with the patient or authorized representative who has indicated his/her understanding and acceptance.     Dental advisory given  Plan  Discussed with: CRNA  Anesthesia Plan Comments:        Anesthesia Quick Evaluation

## 2024-04-24 NOTE — Telephone Encounter (Signed)
 [facility] referral has been made records faxed. Will await Dr Venice Gillis response

## 2024-04-24 NOTE — Transfer of Care (Signed)
 Immediate Anesthesia Transfer of Care Note  Patient: Veronica Hansen  Procedure(s) Performed: EGD (ESOPHAGOGASTRODUODENOSCOPY) ULTRASOUND, UPPER GI TRACT, ENDOSCOPIC FINE NEEDLE ASPIRATION  Patient Location: PACU  Anesthesia Type:General  Level of Consciousness: awake, alert , and oriented  Airway & Oxygen Therapy: Patient Spontanous Breathing  Post-op Assessment: Report given to RN and Post -op Vital signs reviewed and stable  Post vital signs: Reviewed and stable  Last Vitals:  Vitals Value Taken Time  BP    Temp    Pulse    Resp    SpO2      Last Pain:  Vitals:   04/24/24 1046  TempSrc: Temporal  PainSc: 0-No pain         Complications: No notable events documented.

## 2024-04-25 ENCOUNTER — Ambulatory Visit: Payer: Self-pay | Admitting: Gastroenterology

## 2024-04-25 LAB — SURGICAL PATHOLOGY

## 2024-04-25 NOTE — Telephone Encounter (Signed)
 Veronica Hansen, Please see staff message Lets try my purple Monday June 23 1PM at [facility]

## 2024-04-26 ENCOUNTER — Other Ambulatory Visit: Payer: Self-pay

## 2024-04-26 DIAGNOSIS — K8071 Calculus of gallbladder and bile duct without cholecystitis with obstruction: Secondary | ICD-10-CM

## 2024-04-26 NOTE — Telephone Encounter (Signed)
 Orders placed for ERCP, Labs and Cipro for the procedure to be done at [facility]  on June 23 at 1:00 PM.  [facility] was called ( spoke with Mariah Shines ) and requested to schedule case. Mariah Shines stated that that there was no availability  for that date except at 7:30 am  at [facility]. Dr Venice Gillis is in the [facility] that morning.  Dr. Venice Gillis has no availability through August in the Hospital Setting to do the ERCP.  Please review and advise

## 2024-04-26 NOTE — Telephone Encounter (Signed)
 Lajuan Pila, MD  Mansouraty, Albino Alu., MD; Aneita Keens, RN; Cristobal Donning, RN Landon Pinion, Can you pl schedule ERCP on my purple Monday June 23rd at [facility] (if possible) Hold trulicity x 1 week before CBC, CMP, INR (can be drawn day of ERCP) OK for cipro 400 IV pre ERCP RG   GM, Thanks Will try my purple Monday (if [facility] can accommodate) Otherwise will take your 1st available RG

## 2024-04-27 ENCOUNTER — Encounter (HOSPITAL_COMMUNITY): Payer: Self-pay | Admitting: Gastroenterology

## 2024-04-27 NOTE — Telephone Encounter (Signed)
 I see that Dr. Venice Gillis is yellow provider during the month of August. Would check with Arlin Benes they may have availability for him to do procedure in the morning. Because my July schedule is truncated as a result of vacation and multiple hospital weeks, I need to save EUS spots for August. If they will not accommodate or have availability to be able to perform procedure during his yellow hospital week, then please set patient up for September ERCP with me is okay. Thanks. GM

## 2024-04-27 NOTE — Telephone Encounter (Signed)
 Veronica Hansen see note from GM.

## 2024-04-28 NOTE — Telephone Encounter (Signed)
 Pt was made aware of Dr. Venice Gillis recommendations:  Pt was ordered and scheduled for the ERCP on 06/26/2024 at 7:30 AM at [facility] with Dr. Venice Gillis. Pt made aware.  Case ID # [id number] Prep instructions were created and sent to pt via my chart and mail. Pt made aware   Pt verbalized understanding with all questions answered.

## 2024-04-28 NOTE — Telephone Encounter (Signed)
 Pt was ordered and scheduled for the ERCP on 06/26/2024 at 7:30 AM at [facility] with Dr. Venice Gillis.  Case ID # [id number] Prep instructions were created and sent to pt via my chart. Left message for pt to call back

## 2024-05-09 DIAGNOSIS — I10 Essential (primary) hypertension: Secondary | ICD-10-CM | POA: Diagnosis not present

## 2024-05-09 DIAGNOSIS — E1151 Type 2 diabetes mellitus with diabetic peripheral angiopathy without gangrene: Secondary | ICD-10-CM | POA: Diagnosis not present

## 2024-05-09 DIAGNOSIS — K862 Cyst of pancreas: Secondary | ICD-10-CM | POA: Diagnosis not present

## 2024-05-09 DIAGNOSIS — K819 Cholecystitis, unspecified: Secondary | ICD-10-CM | POA: Diagnosis not present

## 2024-05-09 DIAGNOSIS — E782 Mixed hyperlipidemia: Secondary | ICD-10-CM | POA: Diagnosis not present

## 2024-05-10 DIAGNOSIS — G4733 Obstructive sleep apnea (adult) (pediatric): Secondary | ICD-10-CM | POA: Diagnosis not present

## 2024-05-31 ENCOUNTER — Other Ambulatory Visit: Payer: Self-pay | Admitting: Interventional Radiology

## 2024-05-31 DIAGNOSIS — D1771 Benign lipomatous neoplasm of kidney: Secondary | ICD-10-CM

## 2024-06-09 ENCOUNTER — Telehealth: Payer: Self-pay | Admitting: Gastroenterology

## 2024-06-09 NOTE — Telephone Encounter (Signed)
 Attempted to reach patient. No answer, left vm for patient to return call.

## 2024-06-09 NOTE — Telephone Encounter (Signed)
 Patient called and stated that she has a pancreatic test done with us  and is wanting to know the results from it. Patient is requesting some one return her call and follow up with her. Please advise.

## 2024-06-09 NOTE — Telephone Encounter (Signed)
 Patient returned call. She reports that she is trying to find out the test results from the endoscopic ultrasound that she had done on 04/24/2024 with Dr. Wilhelmenia. She states someone called and scheduled her for another procedure with Dr. Charlanne, but she is not really sure what she is needing this test for. Patient had multiple questions regarding why she even needed the other procedure. Routing to provider to review and advise.

## 2024-06-12 NOTE — Telephone Encounter (Signed)
 Veronica Hansen, Thank you for sending this. This was discussed fully at the time of her procedure with her and care partner. She is scheduled for an ERCP with Dr. Charlanne for choledocholithiasis removal that was found during EUS. She was set up for a referral for cholecystectomy consideration due to the finding of choledocholithiasis. She has a procedure result letter on 6/10 that was released to her. In regards to the pancreatic fluid analysis, I do see that I have not released that to her, and so I am going back and trying to find out where the results went.  By end of the week, I should have those results (I will need to reach out to [facility] clinic to see where things stand or if there were issues with the fluid that I was not aware of). Thank you.  GM

## 2024-06-12 NOTE — Telephone Encounter (Signed)
 Patient is returning a call back and requested that if she can get a call back or leave a detailed VM. Patient also stated that she would like a my chart message if we are unable to reach her back through a phone call. Please advise.

## 2024-06-12 NOTE — Telephone Encounter (Signed)
 Attempted to reach patient to discuss provider message. No answer, left vm for patient to return call.

## 2024-06-13 NOTE — Telephone Encounter (Signed)
 Spoke with patient. Discussed results. Advised patient that we are still waiting on the pancreatic fluid analysis and the provider will be reaching out to try and find out the results. Patient verbalized understanding. She reports that she will be ready for her procedure with Dr. Charlanne.

## 2024-06-15 DIAGNOSIS — L821 Other seborrheic keratosis: Secondary | ICD-10-CM | POA: Diagnosis not present

## 2024-06-15 DIAGNOSIS — L578 Other skin changes due to chronic exposure to nonionizing radiation: Secondary | ICD-10-CM | POA: Diagnosis not present

## 2024-06-15 DIAGNOSIS — L814 Other melanin hyperpigmentation: Secondary | ICD-10-CM | POA: Diagnosis not present

## 2024-06-15 DIAGNOSIS — Z85828 Personal history of other malignant neoplasm of skin: Secondary | ICD-10-CM | POA: Diagnosis not present

## 2024-06-15 DIAGNOSIS — Z08 Encounter for follow-up examination after completed treatment for malignant neoplasm: Secondary | ICD-10-CM | POA: Diagnosis not present

## 2024-06-15 DIAGNOSIS — D225 Melanocytic nevi of trunk: Secondary | ICD-10-CM | POA: Diagnosis not present

## 2024-06-19 ENCOUNTER — Telehealth: Payer: Self-pay

## 2024-06-19 NOTE — Telephone Encounter (Signed)
Gupta pt

## 2024-06-19 NOTE — Telephone Encounter (Signed)
 Procedure:ERCP Procedure date: 06/26/24 Procedure location: [facility] Arrival Time: 6:15 am Spoke with the patient Y/N: Spoke with pt at 4:28 pm , she stated she was very un happy with our service. She states that nobody has reached out  to her about her results of her other procedure she had in June.  She states she calls every Monday to talk with a nurse but they don't give her a answer. She states they say there still waiting on her results. Any prep concerns? n  Has the patient obtained the prep from the pharmacy ? n Do you have a care partner and transportation: y Any additional concerns? n

## 2024-06-20 NOTE — Telephone Encounter (Signed)
 Spoke with the pt and discussed the path letter.  All her questions have been answered to the best of my ability.

## 2024-06-20 NOTE — Telephone Encounter (Signed)
Patient calling in regards to results. Please advise.   Thank you

## 2024-06-20 NOTE — Telephone Encounter (Signed)
 I called and spoke with the patient extensively this afternoon. Extensive discussion of the results of the EUS. Discussion about the role of ERCP for stone extraction that was found at the time of EUS. She has an upcoming clinic appointment with surgery as well to consider cholecystectomy. Unfortunately it seems that fluid analysis from the pancreatic cyst is not going to be returning as a result of some issues with it being shipped (unless I get an update from [facility] otherwise). So my plan of action from a pancreatic cyst surveillance monitoring since we do not have fluid analysis at this point is for a 4 to 36-month MRI/MRCP. She agrees with the plan of action.  Patty, 1) patient will get her ERCP next week 2) patient will follow-up with surgery for timing of cholecystectomy if she agrees 3) follow-up with Dr. Charlanne in 3 to 4 months or APP in regards to other GI symptoms 4) MRI/MRCP recall pancreatic cyst for October/November/December since we were unable to get fluid analysis 5) if I get any update further about the fluid, I will let you and the patient know. I hope this clears up everything for the patient, it seems that it did. Thanks. GM  FYI RG

## 2024-06-20 NOTE — Telephone Encounter (Signed)
 Recalls have been entered

## 2024-06-20 NOTE — Telephone Encounter (Signed)
 Dr. Wilhelmenia performed June procedure, patient states that she never received results. Looks like Letter was sent not sure if patient received it. Thanks

## 2024-06-20 NOTE — Telephone Encounter (Signed)
 Dr Veronica the pt is asking for cytology results.  When I looked for the reports this came up as discontinued.  Please advise     Body Fluid   PATH Cytology FNA      Collected: 04/24/2024 12:19 PM   Veronica Hansen   Destination lab: [facility]     View SmartLink Info  Cytology - Non PAP; (Order #[id number]) on 04/24/24   Order Tracking    Ordered  6/9  12:21      Collected  6/9  12:19      Canceled  6/9  12:56  Reason: Canceled by Provider

## 2024-06-26 ENCOUNTER — Ambulatory Visit (HOSPITAL_COMMUNITY): Admitting: Anesthesiology

## 2024-06-26 ENCOUNTER — Encounter (HOSPITAL_COMMUNITY): Payer: Self-pay | Admitting: Gastroenterology

## 2024-06-26 ENCOUNTER — Encounter (HOSPITAL_COMMUNITY)
Admission: RE | Disposition: A | Discharge status: Home / Self Care | Payer: Self-pay | Source: Home / Self Care | Attending: Gastroenterology

## 2024-06-26 ENCOUNTER — Ambulatory Visit (HOSPITAL_COMMUNITY)

## 2024-06-26 ENCOUNTER — Other Ambulatory Visit: Payer: Self-pay

## 2024-06-26 ENCOUNTER — Ambulatory Visit (HOSPITAL_COMMUNITY)
Admission: RE | Admit: 2024-06-26 | Discharge: 2024-06-26 | Disposition: A | Discharge status: Home / Self Care | Attending: Gastroenterology | Admitting: Gastroenterology

## 2024-06-26 DIAGNOSIS — Z9884 Bariatric surgery status: Secondary | ICD-10-CM | POA: Insufficient documentation

## 2024-06-26 DIAGNOSIS — K861 Other chronic pancreatitis: Secondary | ICD-10-CM | POA: Diagnosis not present

## 2024-06-26 DIAGNOSIS — M1990 Unspecified osteoarthritis, unspecified site: Secondary | ICD-10-CM | POA: Insufficient documentation

## 2024-06-26 DIAGNOSIS — E7800 Pure hypercholesterolemia, unspecified: Secondary | ICD-10-CM | POA: Diagnosis not present

## 2024-06-26 DIAGNOSIS — K8050 Calculus of bile duct without cholangitis or cholecystitis without obstruction: Secondary | ICD-10-CM

## 2024-06-26 DIAGNOSIS — E119 Type 2 diabetes mellitus without complications: Secondary | ICD-10-CM | POA: Diagnosis not present

## 2024-06-26 DIAGNOSIS — T182XXA Foreign body in stomach, initial encounter: Secondary | ICD-10-CM

## 2024-06-26 DIAGNOSIS — G4733 Obstructive sleep apnea (adult) (pediatric): Secondary | ICD-10-CM | POA: Diagnosis not present

## 2024-06-26 DIAGNOSIS — D649 Anemia, unspecified: Secondary | ICD-10-CM | POA: Insufficient documentation

## 2024-06-26 DIAGNOSIS — K5730 Diverticulosis of large intestine without perforation or abscess without bleeding: Secondary | ICD-10-CM | POA: Diagnosis not present

## 2024-06-26 DIAGNOSIS — F32A Depression, unspecified: Secondary | ICD-10-CM | POA: Diagnosis not present

## 2024-06-26 DIAGNOSIS — R160 Hepatomegaly, not elsewhere classified: Secondary | ICD-10-CM | POA: Diagnosis not present

## 2024-06-26 DIAGNOSIS — K8070 Calculus of gallbladder and bile duct without cholecystitis without obstruction: Secondary | ICD-10-CM

## 2024-06-26 DIAGNOSIS — Z6841 Body Mass Index (BMI) 40.0 and over, adult: Secondary | ICD-10-CM | POA: Insufficient documentation

## 2024-06-26 DIAGNOSIS — J449 Chronic obstructive pulmonary disease, unspecified: Secondary | ICD-10-CM

## 2024-06-26 DIAGNOSIS — I10 Essential (primary) hypertension: Secondary | ICD-10-CM | POA: Diagnosis not present

## 2024-06-26 DIAGNOSIS — Z87891 Personal history of nicotine dependence: Secondary | ICD-10-CM | POA: Diagnosis not present

## 2024-06-26 DIAGNOSIS — K8071 Calculus of gallbladder and bile duct without cholecystitis with obstruction: Secondary | ICD-10-CM

## 2024-06-26 DIAGNOSIS — K219 Gastro-esophageal reflux disease without esophagitis: Secondary | ICD-10-CM | POA: Insufficient documentation

## 2024-06-26 DIAGNOSIS — K449 Diaphragmatic hernia without obstruction or gangrene: Secondary | ICD-10-CM | POA: Diagnosis not present

## 2024-06-26 DIAGNOSIS — R0602 Shortness of breath: Secondary | ICD-10-CM | POA: Diagnosis not present

## 2024-06-26 DIAGNOSIS — K8020 Calculus of gallbladder without cholecystitis without obstruction: Secondary | ICD-10-CM | POA: Diagnosis not present

## 2024-06-26 DIAGNOSIS — K862 Cyst of pancreas: Secondary | ICD-10-CM | POA: Diagnosis not present

## 2024-06-26 HISTORY — PX: ERCP: SHX5425

## 2024-06-26 LAB — CBC WITH DIFFERENTIAL/PLATELET
Abs Immature Granulocytes: 0.03 K/uL (ref 0.00–0.07)
Basophils Absolute: 0.1 K/uL (ref 0.0–0.1)
Basophils Relative: 1 %
Eosinophils Absolute: 0.2 K/uL (ref 0.0–0.5)
Eosinophils Relative: 3 %
HCT: 42.3 % (ref 36.0–46.0)
Hemoglobin: 13.1 g/dL (ref 12.0–15.0)
Immature Granulocytes: 0 %
Lymphocytes Relative: 26 %
Lymphs Abs: 1.8 K/uL (ref 0.7–4.0)
MCH: 26.6 pg (ref 26.0–34.0)
MCHC: 31 g/dL (ref 30.0–36.0)
MCV: 85.8 fL (ref 80.0–100.0)
Monocytes Absolute: 0.6 K/uL (ref 0.1–1.0)
Monocytes Relative: 9 %
Neutro Abs: 4.4 K/uL (ref 1.7–7.7)
Neutrophils Relative %: 61 %
Platelets: 217 K/uL (ref 150–400)
RBC: 4.93 MIL/uL (ref 3.87–5.11)
RDW: 14.5 % (ref 11.5–15.5)
WBC: 7.1 K/uL (ref 4.0–10.5)
nRBC: 0 % (ref 0.0–0.2)

## 2024-06-26 LAB — COMPREHENSIVE METABOLIC PANEL WITH GFR
ALT: 17 U/L (ref 0–44)
AST: 27 U/L (ref 15–41)
Albumin: 3.7 g/dL (ref 3.5–5.0)
Alkaline Phosphatase: 69 U/L (ref 38–126)
Anion gap: 10 (ref 5–15)
BUN: 11 mg/dL (ref 8–23)
CO2: 22 mmol/L (ref 22–32)
Calcium: 9.4 mg/dL (ref 8.9–10.3)
Chloride: 105 mmol/L (ref 98–111)
Creatinine, Ser: 0.66 mg/dL (ref 0.44–1.00)
GFR, Estimated: >60 mL/min (ref >60)
Glucose, Bld: 96 mg/dL (ref 70–99)
Potassium: 5 mmol/L (ref 3.5–5.1)
Sodium: 137 mmol/L (ref 135–145)
Total Bilirubin: 1.1 mg/dL (ref 0.0–1.2)
Total Protein: 7 g/dL (ref 6.5–8.1)

## 2024-06-26 LAB — PROTIME-INR
INR: 1 (ref 0.8–1.2)
Prothrombin Time: 13.5 s (ref 11.4–15.2)

## 2024-06-26 LAB — LIPASE, BLOOD: Lipase: 45 U/L (ref 11–51)

## 2024-06-26 SURGERY — ERCP, WITH INTERVENTION IF INDICATED
Anesthesia: General

## 2024-06-26 MED ORDER — INDOMETHACIN 50 MG RE SUPP
RECTAL | Status: AC
Start: 2024-06-26 — End: 2024-06-26
  Filled 2024-06-26: qty 2

## 2024-06-26 MED ORDER — SODIUM CHLORIDE 0.9 % IV SOLN
INTRAVENOUS | Status: DC | PRN
  Administered 2024-06-26 (×2): 20 mL

## 2024-06-26 MED ORDER — DEXAMETHASONE SODIUM PHOSPHATE 10 MG/ML IJ SOLN
INTRAMUSCULAR | Status: DC | PRN
  Administered 2024-06-26 (×2): 5 mg via INTRAVENOUS

## 2024-06-26 MED ORDER — ONDANSETRON HCL 4 MG/2ML IJ SOLN
INTRAMUSCULAR | Status: DC | PRN
  Administered 2024-06-26 (×2): 4 mg via INTRAVENOUS

## 2024-06-26 MED ORDER — PHENYLEPHRINE 80 MCG/ML (10ML) SYRINGE FOR IV PUSH (FOR BLOOD PRESSURE SUPPORT)
PREFILLED_SYRINGE | INTRAVENOUS | Status: DC | PRN
  Administered 2024-06-26 (×2): 80 ug via INTRAVENOUS

## 2024-06-26 MED ORDER — CIPROFLOXACIN IN D5W 400 MG/200ML IV SOLN
INTRAVENOUS | Status: AC
Start: 2024-06-26 — End: 2024-06-26
  Filled 2024-06-26: qty 200

## 2024-06-26 MED ORDER — SODIUM CHLORIDE 0.9 % IV SOLN: INTRAVENOUS | Status: DC

## 2024-06-26 MED ORDER — PROPOFOL 10 MG/ML IV BOLUS
INTRAVENOUS | Status: DC | PRN
  Administered 2024-06-26 (×2): 130 mg via INTRAVENOUS

## 2024-06-26 MED ORDER — LIDOCAINE 2% (20 MG/ML) 5 ML SYRINGE
INTRAMUSCULAR | Status: DC | PRN
  Administered 2024-06-26 (×2): 100 mg via INTRAVENOUS

## 2024-06-26 MED ORDER — DICLOFENAC SUPPOSITORY 100 MG
RECTAL | Status: DC | PRN
  Administered 2024-06-26 (×2): 100 mg via RECTAL

## 2024-06-26 MED ORDER — SODIUM CHLORIDE 0.9 % IV SOLN
INTRAVENOUS | Status: AC | PRN
  Administered 2024-06-26 (×2): 1000 mL via INTRAVENOUS

## 2024-06-26 MED ORDER — PHENYLEPHRINE HCL-NACL 20-0.9 MG/250ML-% IV SOLN
INTRAVENOUS | Status: DC | PRN
  Administered 2024-06-26 (×2): 50 ug/min via INTRAVENOUS

## 2024-06-26 MED ORDER — CIPROFLOXACIN IN D5W 400 MG/200ML IV SOLN
400.0000 mg | Freq: Once | INTRAVENOUS | Status: AC
  Administered 2024-06-26 (×2): 400 mg via INTRAVENOUS

## 2024-06-26 MED ORDER — LACTATED RINGERS IV SOLN: INTRAVENOUS | Status: DC | PRN

## 2024-06-26 MED ORDER — SUGAMMADEX SODIUM 200 MG/2ML IV SOLN
INTRAVENOUS | Status: DC | PRN
  Administered 2024-06-26 (×4): 100 mg via INTRAVENOUS

## 2024-06-26 MED ORDER — ROCURONIUM BROMIDE 10 MG/ML (PF) SYRINGE
PREFILLED_SYRINGE | INTRAVENOUS | Status: DC | PRN
  Administered 2024-06-26 (×2): 50 mg via INTRAVENOUS

## 2024-06-26 MED ORDER — DICLOFENAC SUPPOSITORY 100 MG
RECTAL | Status: AC
  Filled 2024-06-26: qty 1

## 2024-06-26 MED ORDER — GLUCAGON HCL RDNA (DIAGNOSTIC) 1 MG IJ SOLR
INTRAMUSCULAR | Status: DC | PRN
  Administered 2024-06-26 (×2): .25 mg via INTRAVENOUS

## 2024-06-26 MED ORDER — GLUCAGON HCL RDNA (DIAGNOSTIC) 1 MG IJ SOLR
INTRAMUSCULAR | Status: AC
  Filled 2024-06-26: qty 2

## 2024-06-26 NOTE — Discharge Instructions (Signed)

## 2024-06-26 NOTE — H&P (Signed)
 Chief Complaint: Abn MRCP  Referring Provider:  Charlanne Groom, MD      ASSESSMENT AND PLAN;   #1. CBD stone on EUS, cholelithiasis  #2. 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 No evidence of exocrine insufficiency.  Radiology recommends EUS/FNA.  #2. Renal mass on MRI-being followed by urology  #3. Asymptomtic cholelithiasis/hepatomegaly without any obvious cirrhosis.  #4. IDA. Neg colon 05/2022. S/P R Hip replacement   Plan:  ERCP today  I have explained risks and benefits including small but definite risks of pancreatitis (<10%), bleeding (<1%), perforation (<1%). The risks of general anesthesia were also discussed by me and anesthesia staff.  The benefits were also discussed.  Patient wishes to proceed.  All the questions were answered.    HPI:    Veronica Hansen is a 78 y.o. female  A Fib s/p ablation (off AC), COPD (not on home O2), diet-controlled DM 2, HTN, HLD, OSA on CPAP, history of colonic polyps 07-09-12, IDA (followed by hematology, on iron supplements), S/P gastric stapling 07/09/12  Denies having any significant GI problems except for occasional diarrhea with fecal incontinence as before.  And negative colonoscopy 05/2022 as below.  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  With associated calcifications consistent with chronic pancreatitis.  Radiology recommends EUS/FNA, and repeat MRCP in 1 year -Has cholelithiasis -Hepatomegaly.  No definite cirrhosis or portal hypertension on MRI (CTA ?early cirrhosis)   Stool studies including fecal elastase were  negative  Underwent recent right hip replacement Did have microcytic anemia with hemoglobin 11 to 9.8 (after Sx), MCV 80.   No melena or hematochezia. She denies having any abdominal pain, jaundice dark urine or pale stools,  No previous history of pancreatitis.  No history of significant alcohol use.  No family history of pancreatic CA.   Previous GI procedures: Colon 05/2022 - One 6 mm polyp in the proximal ascending colon, removed with a cold snare. Resected and retrieved. - Minimal sigmoid diverticulosis. - Non-bleeding internal hemorrhoids. -Bx- TA.  No need to repeat due to age -Negative random colon biopsies for microscopic colitis.  Past Medical History:  Diagnosis Date   A-fib (HCC)    A-fib (HCC) 07/2018   treated with ablation procedure in October 2019   Allergic rhinitis    Anemia    Arthritis    joints hurt   ASCVD (arteriosclerotic cardiovascular disease)    Atypical chest pain    cardiology consult showed an echocardiogram with left ventricular hypertrophy normal left ventricular function.   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   COPD (chronic obstructive pulmonary disease) (HCC)    Depression    husband died of Covid 07/09/2020   Diabetes mellitus    type 2, no longer takes medication    Diabetes mellitus (HCC)    Dyspnea    GERD (gastroesophageal reflux disease)    History of blood transfusion    HTN (hypertension)    Hypercholesterolemia    Hyperlipidemia    Hypertension    Incontinence    Iron deficiency anemia    Lower esophageal sphincter, relaxation  Morbid obesity (HCC)    OSA (obstructive sleep apnea)    OSA on CPAP 03/07/2014   OSA on CPAP    Proteinuria 2001   Psoriasis    RLL pneumonia 06/2016   Runny nose    seasonal - poss allergies   Sinus problem    SOB (shortness of breath)    Tobacco use    Stopped in 2004    Past Surgical History:  Procedure Laterality Date   A-FLUTTER ABLATION N/A 08/19/2018   Procedure: A-FLUTTER ABLATION;   Surgeon: Waddell Danelle ORN, MD;  Location: [facility];  Service: Cardiovascular;  Laterality: N/A;   ATRIAL FLUTTER ABLATION     BARIATRIC SURGERY  2013   dental implants  11/16/2009   ESOPHAGOGASTRODUODENOSCOPY N/A 04/24/2024   Procedure: EGD (ESOPHAGOGASTRODUODENOSCOPY);  Surgeon: Wilhelmenia Aloha Raddle., MD;  Location: [facility];  Service: Gastroenterology;  Laterality: N/A;   EUS N/A 04/24/2024   Procedure: ULTRASOUND, UPPER GI TRACT, ENDOSCOPIC;  Surgeon: Wilhelmenia Aloha Raddle., MD;  Location: [facility];  Service: Gastroenterology;  Laterality: N/A;   FINE NEEDLE ASPIRATION  04/24/2024   Procedure: FINE NEEDLE ASPIRATION;  Surgeon: Wilhelmenia, Aloha Raddle., MD;  Location: [facility];  Service: Gastroenterology;;   IR EMBO TUMOR ORGAN ISCHEMIA INFARCT INC GUIDE ROADMAPPING  01/21/2024   IR RADIOLOGIST EVAL & MGMT  12/27/2023   IR RENAL SUPRASEL UNI S&I MOD SED  01/21/2024   IR RENAL SUPRASEL UNI S&I MOD SED  01/21/2024   IR US  GUIDE VASC ACCESS RIGHT  01/21/2024   LARYNX SURGERY     polyp removed   left breast bx was fibrocystic change     left cataract extraction and lens implantation  01/11/2019   ORIF ULNAR FRACTURE Right 02/15/2021   Procedure: OPEN REDUCTION INTERNAL FIXATION (ORIF) ULNAR FRACTURE and distal radius open reduction and internal fixation and repair;  Surgeon: Shari Easter, MD;  Location: [facility];  Service: Orthopedics;  Laterality: Right;  with IV sedation   Right TKR  2007   TOTAL HIP ARTHROPLASTY Right 06/03/2023   Procedure: TOTAL HIP ARTHROPLASTY ANTERIOR APPROACH;  Surgeon: Ernie Cough, MD;  Location: [facility];  Service: Orthopedics;  Laterality: Right;   TOTAL KNEE ARTHROPLASTY  11/17/2003   left   TOTAL KNEE ARTHROPLASTY  11/16/2005   right    Family History  Problem Relation Age of Onset   Diabetes Father    Diabetes Brother    Colon cancer Neg Hx    Rectal cancer Neg Hx    Stomach cancer Neg Hx     Social History   Tobacco Use   Smoking status:  Former    Current packs/day: 0.00    Types: Cigarettes    Start date: 08/19/1980    Quit date: 08/19/2000    Years since quitting: 23.8   Smokeless tobacco: Never  Vaping Use   Vaping status: Never Used  Substance Use Topics   Alcohol use: No    Comment: Rare   Drug use: No    No current facility-administered medications for this encounter.    Allergies  Allergen Reactions   Clindamycin/Lincomycin Itching   Orlistat Diarrhea    Other reaction(s): severe diarrhea   Phentermine Hcl     Other reaction(s): elevated BP    Review of Systems:  Constitutional: Denies fever, chills, diaphoresis, appetite change and fatigue.  HEENT: Denies photophobia, eye pain, redness, hearing loss, ear pain, congestion, sore throat, rhinorrhea, sneezing, mouth sores, neck pain, neck stiffness and tinnitus.  Respiratory: Denies SOB, DOE, cough, chest tightness,  and wheezing.   Cardiovascular: Denies chest pain, palpitations and leg swelling.  Genitourinary: Denies dysuria, urgency, frequency, hematuria, flank pain and difficulty urinating.  Musculoskeletal: Denies myalgias, back pain, joint swelling, arthralgias and gait problem.  Skin: No rash.  Neurological: Denies dizziness, seizures, syncope, weakness, light-headedness, numbness and headaches.  Hematological: Denies adenopathy. Easy bruising, personal or family bleeding history  Psychiatric/Behavioral: No anxiety or depression     Physical Exam:    There were no vitals taken for this visit. Wt Readings from Last 3 Encounters:  04/24/24 112.5 kg  01/21/24 112.5 kg  11/25/23 110.7 kg   Constitutional:  Well-developed, in no acute distress. Psychiatric: Normal mood and affect. Behavior is normal. HEENT: Pupils normal.  Conjunctivae are normal. No scleral icterus. Neck supple.  Cardiovascular: Normal rate, regular rhythm. No edema Pulmonary/chest: Effort normal and breath sounds normal. No wheezing, rales or rhonchi. Abdominal: Soft,  nondistended. Nontender. Bowel sounds active throughout. There are no masses palpable. No hepatomegaly. Rectal: Deferred Neurological: Alert and oriented to person place and time. Skin: Skin is warm and dry. No rashes noted.  Data Reviewed: I have personally reviewed following labs and imaging studies  CBC:    Latest Ref Rng & Units 01/21/2024    7:53 AM 09/15/2023    2:27 PM 06/04/2023    3:56 AM  CBC  WBC 4.0 - 10.5 K/uL 6.7  9.0  12.1   Hemoglobin 12.0 - 15.0 g/dL 87.2  88.8  9.8   Hematocrit 36.0 - 46.0 % 40.3  36.0  32.4   Platelets 150 - 400 K/uL 213  305.0  209     CMP:    Latest Ref Rng & Units 04/24/2024   12:53 PM 03/16/2024   11:49 AM 01/21/2024    7:53 AM  CMP  Glucose 70 - 99 mg/dL   89   BUN 8 - 23 mg/dL   12   Creatinine 9.55 - 1.00 mg/dL  9.29  9.55   Sodium 864 - 145 mmol/L   139   Potassium 3.5 - 5.1 mmol/L   3.8   Chloride 98 - 111 mmol/L   105   CO2 22 - 32 mmol/L   25   Calcium 8.9 - 10.3 mg/dL   9.1   Total Protein 6.5 - 8.1 g/dL 5.8   7.2   Total Bilirubin 0.0 - 1.2 mg/dL 1.1   0.7   Alkaline Phos 38 - 126 U/L 53   65   AST 15 - 41 U/L 22   20   ALT 0 - 44 U/L 14   16     GFR: CrCl cannot be calculated (Patient's most recent lab result is older than the maximum 21 days allowed.). Liver Function Tests: No results for input(s): AST, ALT, ALKPHOS, BILITOT, PROT, ALBUMIN in the last 168 hours. No results for input(s): LIPASE, AMYLASE in the last 168 hours. No results for input(s): AMMONIA in the last 168 hours. Coagulation Profile: No results for input(s): INR, PROTIME in the last 168 hours. HbA1C: No results for input(s): HGBA1C in the last 72 hours. Lipid Profile: No results for input(s): CHOL, HDL, LDLCALC, TRIG, CHOLHDL, LDLDIRECT in the last 72 hours. Thyroid  Function Tests: No results for input(s): TSH, T4TOTAL, FREET4, T3FREE, THYROIDAB in the last 72 hours. Anemia Panel: No results for input(s):  VITAMINB12, FOLATE, FERRITIN, TIBC, IRON, RETICCTPCT in the last 72 hours.  No results found for this or any previous  visit (from the past 240 hours).    Radiology Studies: No results found.    Anselm Bring, MD 06/26/2024, 7:17 AM  Cc: Bring Groom, MD

## 2024-06-26 NOTE — Anesthesia Preprocedure Evaluation (Addendum)
 Anesthesia Evaluation  Patient identified by MRN, date of birth, ID band Patient awake    Reviewed: Allergy & Precautions, NPO status , Patient's Chart, lab work & pertinent test results, reviewed documented beta blocker date and time   History of Anesthesia Complications Negative for: history of anesthetic complications  Airway Mallampati: II  TM Distance: >3 FB Neck ROM: Full    Dental no notable dental hx.    Pulmonary shortness of breath, sleep apnea , pneumonia, COPD, former smoker   Pulmonary exam normal breath sounds clear to auscultation       Cardiovascular hypertension, (-) angina (-) CAD and (-) Past MI Normal cardiovascular exam Rhythm:Regular Rate:Normal  4.6cm ascending aorta   Neuro/Psych neg Seizures PSYCHIATRIC DISORDERS  Depression     Neuromuscular disease    GI/Hepatic ,GERD  ,,(+) neg Cirrhosis        Endo/Other  diabetes    Renal/GU Renal disease     Musculoskeletal  (+) Arthritis ,    Abdominal   Peds  Hematology  (+) Blood dyscrasia, anemia   Anesthesia Other Findings   Reproductive/Obstetrics                              Anesthesia Physical Anesthesia Plan  ASA: 3  Anesthesia Plan: MAC   Post-op Pain Management:    Induction: Intravenous  PONV Risk Score and Plan: Propofol  infusion and Treatment may vary due to age or medical condition  Airway Management Planned: Natural Airway  Additional Equipment:   Intra-op Plan:   Post-operative Plan:   Informed Consent: I have reviewed the patients History and Physical, chart, labs and discussed the procedure including the risks, benefits and alternatives for the proposed anesthesia with the patient or authorized representative who has indicated his/her understanding and acceptance.     Dental advisory given  Plan Discussed with: CRNA  Anesthesia Plan Comments:         Anesthesia Quick  Evaluation

## 2024-06-26 NOTE — Op Note (Signed)
 [facility] Patient Name: Veronica Hansen Procedure Date : 06/26/2024 MRN: [id number] Attending MD: Lynnie Bring , MD, [id number] Date of Birth: 1946-05-21 CSN: [id number] Age: 78 Admit Type: Outpatient Procedure:                ERCP Indications:              Bile duct stone(s) on EUS. Pt with known                            cholelithiasis. Providers:                Lynnie Bring, MD, Ozell Pouch, Coye Bade, Technician Referring MD:             Lynnie Bring, MD Medicines:                General Anesthesia, Cipro  400 mg IV, Indomethacin                             100 mg PR, glucagon  0.25 mg IV Complications:            No immediate complications. Estimated Blood Loss:     Estimated blood loss: none. Procedure:                Pre-Anesthesia Assessment:                           - Prior to the procedure, a History and Physical                            was performed, and patient medications and                            allergies were reviewed. The patient's tolerance of                            previous anesthesia was also reviewed. The risks                            and benefits of the procedure and the sedation                            options and risks were discussed with the patient.                            All questions were answered, and informed consent                            was obtained. Prior Anticoagulants: The patient has                            taken no anticoagulant or antiplatelet agents. ASA  Grade Assessment: III - A patient with severe                            systemic disease. After reviewing the risks and                            benefits, the patient was deemed in satisfactory                            condition to undergo the procedure.                           After obtaining informed consent, the scope was                            passed under direct vision.  Throughout the                            procedure, the patient's blood pressure, pulse, and                            oxygen saturations were monitored continuously. The                            [id number]-[id number] ([id number] Olympus duodenoscope was                            introduced through the mouth, and used to inject                            contrast into and used to inject contrast into the                            bile duct. The ERCP was accomplished without                            difficulty. The patient tolerated the procedure                            well. Scope In: Scope Out: Findings:      The scout film was normal. The esophagus was successfully intubated       under direct vision.      Erosive esophagitis had healed. Hiatal hernia was noted. Some food in       the stomach. The scope was advanced to a normal major papilla in the       descending duodenum. A short 0.035 inch Soft Jagwire was passed into the       biliary tree. The short-nosed traction sphincterotome was passed over       the guidewire and the bile duct was then deeply cannulated. Contrast was       injected. I personally interpreted the bile duct images. Ductal flow of       contrast was adequate. Image quality was adequate. Contrast extended to       the entire biliary tree.  Small filling defect consistent with choledocholithiasis was found in a       nondilated duct. Right and left hepatic ducts were normal. The cystic       duct was patent. A large filling defect consistent with cholelithiasis       in the gallbladder. A 6 mm biliary sphincterotomy was made with a       monofilament traction (standard) sphincterotome using ERBE       electrocautery. There was no post-sphincterotomy bleeding. The biliary       tree was swept with a 9 mm balloon starting at the bifurcation. Single       stone and sludge was swept from the duct. All stones were removed. No       residual filling defects on  postocclusion cholangiogram.      PD was not cannulated or injected (intentionally)      Total fluoroscopy time: 19 sec. Impression:               - Choledocholithiasis was found. Complete removal                            was accomplished by biliary sphincterotomy and                            balloon extraction.                           - Cholelithiasis with patent cystic duct. Recommendation:           - Discharge patient to home (with escort).                           - Clear liquid diet today. Then advance to low-fat                            diet in AM.                           - She already has appointment with surgery for                            laparoscopic cholecystectomy next week.                           - As per Dr. Wilhelmenia, would recommend repeating                            MR pancreas in next 6 months for FU cyst tail of                            the pancreas.                           - Watch for pancreatitis, bleeding, perforation,                            and cholangitis.                           -  The findings and recommendations were discussed                            with the patient's family. Procedure Code(s):        --- Professional ---                           [phone], Endoscopic retrograde                            cholangiopancreatography (ERCP); with removal of                            calculi/debris from biliary/pancreatic duct(s)                           43262, Endoscopic retrograde                            cholangiopancreatography (ERCP); with                            sphincterotomy/papillotomy                           [phone], Endoscopic catheterization of the biliary                            ductal system, radiological supervision and                            interpretation Diagnosis Code(s):        --- Professional ---                           K80.50, Calculus of bile duct without cholangitis                             or cholecystitis without obstruction CPT copyright 2022 [employer] Association. All rights reserved. The codes documented in this report are preliminary and upon coder review may  be revised to meet current compliance requirements. Lynnie Bring, MD 06/26/2024 8:47:40 AM This report has been signed electronically. Number of Addenda: 0

## 2024-06-26 NOTE — Transfer of Care (Signed)
 Immediate Anesthesia Transfer of Care Note  Patient: Veronica Hansen  Procedure(s) Performed: ERCP, WITH INTERVENTION IF INDICATED  Patient Location: PACU and Endoscopy Unit  Anesthesia Type:General  Level of Consciousness: awake, alert , and oriented  Airway & Oxygen Therapy: Patient Spontanous Breathing  Post-op Assessment: Report given to RN and Post -op Vital signs reviewed and stable  Post vital signs: Reviewed and stable  Last Vitals:  Vitals Value Taken Time  BP    Temp    Pulse    Resp    SpO2      Last Pain:  Vitals:   06/26/24 0723  TempSrc: Temporal  PainSc: 0-No pain         Complications: No notable events documented.

## 2024-06-26 NOTE — H&P (Signed)
 Chief Complaint: Abn MRCP  Referring Provider:  Charlanne Groom, MD      ASSESSMENT AND PLAN;   #1. CBD stone on EUS, cholelithiasis  #2. 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 No evidence of exocrine insufficiency.  Radiology recommends EUS/FNA.  #2. Renal mass on MRI-being followed by urology  #3. Asymptomtic cholelithiasis/hepatomegaly without any obvious cirrhosis.  #4. IDA. Neg colon 05/2022. S/P R Hip replacement   Plan:  ERCP today  I have explained risks and benefits including small but definite risks of pancreatitis (<10%), bleeding (<1%), perforation (<1%). The risks of general anesthesia were also discussed by me and anesthesia staff.  The benefits were also discussed.  Patient wishes to proceed.  All the questions were answered.    HPI:    Veronica Hansen is a 78 y.o. female  A Fib s/p ablation (off AC), COPD (not on home O2), diet-controlled DM 2, HTN, HLD, OSA on CPAP, history of colonic polyps 30-Jul-2012, IDA (followed by hematology, on iron supplements), S/P gastric stapling 30-Jul-2012  Denies having any significant GI problems except for occasional diarrhea with fecal incontinence as before.  And negative colonoscopy 05/2022 as below.  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  With associated calcifications consistent with chronic pancreatitis.  Radiology recommends EUS/FNA, and repeat MRCP in 1 year -Has cholelithiasis -Hepatomegaly.  No definite cirrhosis or portal hypertension on MRI (CTA ?early cirrhosis)   Stool studies including fecal elastase were  negative  Underwent recent right hip replacement Did have microcytic anemia with hemoglobin 11 to 9.8 (after Sx), MCV 80.   No melena or hematochezia. She denies having any abdominal pain, jaundice dark urine or pale stools,  No previous history of pancreatitis.  No history of significant alcohol  use.  No family history of pancreatic CA.   Previous GI procedures: Colon 05/2022 - One 6 mm polyp in the proximal ascending colon, removed with a cold snare. Resected and retrieved. - Minimal sigmoid diverticulosis. - Non-bleeding internal hemorrhoids. -Bx- TA.  No need to repeat due to age -Negative random colon biopsies for microscopic colitis.  Past Medical History:  Diagnosis Date   A-fib (HCC)    A-fib (HCC) 07/2018   treated with ablation procedure in October 2019   Allergic rhinitis    Anemia    Arthritis    joints hurt   ASCVD (arteriosclerotic cardiovascular disease)    Atypical chest pain    cardiology consult showed an echocardiogram with left ventricular hypertrophy normal left ventricular function.   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   COPD (chronic obstructive pulmonary disease) (HCC)    Depression    husband died of Covid 2020/07/30   Diabetes mellitus    type 2, no longer takes medication    Diabetes mellitus (HCC)    Dyspnea    GERD (gastroesophageal reflux disease)    History of blood transfusion    HTN (hypertension)    Hypercholesterolemia    Hyperlipidemia    Hypertension    Incontinence    Iron deficiency anemia    Lower esophageal sphincter, relaxation  Morbid obesity (HCC)    OSA (obstructive sleep apnea)    OSA on CPAP 03/07/2014   OSA on CPAP    Proteinuria 2001   Psoriasis    RLL pneumonia 06/2016   Runny nose    seasonal - poss allergies   Sinus problem    SOB (shortness of breath)    Tobacco use    Stopped in 2004    Past Surgical History:  Procedure Laterality Date   A-FLUTTER ABLATION N/A 08/19/2018   Procedure: A-FLUTTER ABLATION;   Surgeon: Waddell Danelle ORN, MD;  Location: [facility];  Service: Cardiovascular;  Laterality: N/A;   ATRIAL FLUTTER ABLATION     BARIATRIC SURGERY  2013   dental implants  11/16/2009   ESOPHAGOGASTRODUODENOSCOPY N/A 04/24/2024   Procedure: EGD (ESOPHAGOGASTRODUODENOSCOPY);  Surgeon: Wilhelmenia Aloha Raddle., MD;  Location: [facility];  Service: Gastroenterology;  Laterality: N/A;   EUS N/A 04/24/2024   Procedure: ULTRASOUND, UPPER GI TRACT, ENDOSCOPIC;  Surgeon: Wilhelmenia Aloha Raddle., MD;  Location: [facility];  Service: Gastroenterology;  Laterality: N/A;   FINE NEEDLE ASPIRATION  04/24/2024   Procedure: FINE NEEDLE ASPIRATION;  Surgeon: Wilhelmenia, Aloha Raddle., MD;  Location: [facility];  Service: Gastroenterology;;   IR EMBO TUMOR ORGAN ISCHEMIA INFARCT INC GUIDE ROADMAPPING  01/21/2024   IR RADIOLOGIST EVAL & MGMT  12/27/2023   IR RENAL SUPRASEL UNI S&I MOD SED  01/21/2024   IR RENAL SUPRASEL UNI S&I MOD SED  01/21/2024   IR US  GUIDE VASC ACCESS RIGHT  01/21/2024   LARYNX SURGERY     polyp removed   left breast bx was fibrocystic change     left cataract extraction and lens implantation  01/11/2019   ORIF ULNAR FRACTURE Right 02/15/2021   Procedure: OPEN REDUCTION INTERNAL FIXATION (ORIF) ULNAR FRACTURE and distal radius open reduction and internal fixation and repair;  Surgeon: Shari Easter, MD;  Location: [facility];  Service: Orthopedics;  Laterality: Right;  with IV sedation   Right TKR  2007   TOTAL HIP ARTHROPLASTY Right 06/03/2023   Procedure: TOTAL HIP ARTHROPLASTY ANTERIOR APPROACH;  Surgeon: Ernie Cough, MD;  Location: [facility];  Service: Orthopedics;  Laterality: Right;   TOTAL KNEE ARTHROPLASTY  11/17/2003   left   TOTAL KNEE ARTHROPLASTY  11/16/2005   right    Family History  Problem Relation Age of Onset   Diabetes Father    Diabetes Brother    Colon cancer Neg Hx    Rectal cancer Neg Hx    Stomach cancer Neg Hx     Social History   Tobacco Use   Smoking status:  Former    Current packs/day: 0.00    Types: Cigarettes    Start date: 08/19/1980    Quit date: 08/19/2000    Years since quitting: 23.8   Smokeless tobacco: Never  Vaping Use   Vaping status: Never Used  Substance Use Topics   Alcohol  use: No    Comment: Rare   Drug use: No    No current facility-administered medications for this encounter.    Allergies  Allergen Reactions   Clindamycin/Lincomycin Itching   Orlistat Diarrhea    Other reaction(s): severe diarrhea   Phentermine Hcl     Other reaction(s): elevated BP    Review of Systems:  Constitutional: Denies fever, chills, diaphoresis, appetite change and fatigue.  HEENT: Denies photophobia, eye pain, redness, hearing loss, ear pain, congestion, sore throat, rhinorrhea, sneezing, mouth sores, neck pain, neck stiffness and tinnitus.  Respiratory: Denies SOB, DOE, cough, chest tightness,  and wheezing.   Cardiovascular: Denies chest pain, palpitations and leg swelling.  Genitourinary: Denies dysuria, urgency, frequency, hematuria, flank pain and difficulty urinating.  Musculoskeletal: Denies myalgias, back pain, joint swelling, arthralgias and gait problem.  Skin: No rash.  Neurological: Denies dizziness, seizures, syncope, weakness, light-headedness, numbness and headaches.  Hematological: Denies adenopathy. Easy bruising, personal or family bleeding history  Psychiatric/Behavioral: No anxiety or depression     Physical Exam:    There were no vitals taken for this visit. Wt Readings from Last 3 Encounters:  04/24/24 112.5 kg  01/21/24 112.5 kg  11/25/23 110.7 kg   Constitutional:  Well-developed, in no acute distress. Psychiatric: Normal mood and affect. Behavior is normal. HEENT: Pupils normal.  Conjunctivae are normal. No scleral icterus. Neck supple.  Cardiovascular: Normal rate, regular rhythm. No edema Pulmonary/chest: Effort normal and breath sounds normal. No wheezing, rales or rhonchi. Abdominal: Soft,  nondistended. Nontender. Bowel sounds active throughout. There are no masses palpable. No hepatomegaly. Rectal: Deferred Neurological: Alert and oriented to person place and time. Skin: Skin is warm and dry. No rashes noted.  Data Reviewed: I have personally reviewed following labs and imaging studies  CBC:    Latest Ref Rng & Units 01/21/2024    7:53 AM 09/15/2023    2:27 PM 06/04/2023    3:56 AM  CBC  WBC 4.0 - 10.5 K/uL 6.7  9.0  12.1   Hemoglobin 12.0 - 15.0 g/dL 87.2  88.8  9.8   Hematocrit 36.0 - 46.0 % 40.3  36.0  32.4   Platelets 150 - 400 K/uL 213  305.0  209     CMP:    Latest Ref Rng & Units 04/24/2024   12:53 PM 03/16/2024   11:49 AM 01/21/2024    7:53 AM  CMP  Glucose 70 - 99 mg/dL   89   BUN 8 - 23 mg/dL   12   Creatinine 9.55 - 1.00 mg/dL  9.29  9.55   Sodium 864 - 145 mmol/L   139   Potassium 3.5 - 5.1 mmol/L   3.8   Chloride 98 - 111 mmol/L   105   CO2 22 - 32 mmol/L   25   Calcium 8.9 - 10.3 mg/dL   9.1   Total Protein 6.5 - 8.1 g/dL 5.8   7.2   Total Bilirubin 0.0 - 1.2 mg/dL 1.1   0.7   Alkaline Phos 38 - 126 U/L 53   65   AST 15 - 41 U/L 22   20   ALT 0 - 44 U/L 14   16     GFR: CrCl cannot be calculated (Patient's most recent lab result is older than the maximum 21 days allowed.). Liver Function Tests: No results for input(s): AST, ALT, ALKPHOS, BILITOT, PROT, ALBUMIN in the last 168 hours. No results for input(s): LIPASE, AMYLASE in the last 168 hours. No results for input(s): AMMONIA in the last 168 hours. Coagulation Profile: No results for input(s): INR, PROTIME in the last 168 hours. HbA1C: No results for input(s): HGBA1C in the last 72 hours. Lipid Profile: No results for input(s): CHOL, HDL, LDLCALC, TRIG, CHOLHDL, LDLDIRECT in the last 72 hours. Thyroid  Function Tests: No results for input(s): TSH, T4TOTAL, FREET4, T3FREE, THYROIDAB in the last 72 hours. Anemia Panel: No results for input(s):  VITAMINB12, FOLATE, FERRITIN, TIBC, IRON, RETICCTPCT in the last 72 hours.  No results found for this or any previous  visit (from the past 240 hours).    Radiology Studies: No results found.    Anselm Bring, MD 06/26/2024, 7:17 AM  Cc: Bring Groom, MD

## 2024-06-26 NOTE — Anesthesia Procedure Notes (Signed)
 Procedure Name: Intubation Date/Time: 06/26/2024 7:47 AM  Performed by: Tammara Massing A, CRNAPre-anesthesia Checklist: Patient identified, Emergency Drugs available, Suction available and Patient being monitored Patient Re-evaluated:Patient Re-evaluated prior to induction Oxygen Delivery Method: Circle System Utilized Preoxygenation: Pre-oxygenation with 100% oxygen Induction Type: IV induction Ventilation: Mask ventilation without difficulty Laryngoscope Size: Mac and 4 Grade View: Grade II Tube type: Oral Tube size: 7.5 mm Number of attempts: 1 Airway Equipment and Method: Stylet and Oral airway Placement Confirmation: ETT inserted through vocal cords under direct vision, positive ETCO2 and breath sounds checked- equal and bilateral Secured at: 22 cm Tube secured with: Tape Dental Injury: Teeth and Oropharynx as per pre-operative assessment

## 2024-06-26 NOTE — Anesthesia Postprocedure Evaluation (Signed)
 Anesthesia Post Note  Patient: Veronica Hansen  Procedure(s) Performed: ERCP, WITH INTERVENTION IF INDICATED     Patient location during evaluation: PACU Anesthesia Type: General Level of consciousness: awake and alert Pain management: pain level controlled Vital Signs Assessment: post-procedure vital signs reviewed and stable Respiratory status: spontaneous breathing, nonlabored ventilation, respiratory function stable and patient connected to nasal cannula oxygen Cardiovascular status: blood pressure returned to baseline and stable Postop Assessment: no apparent nausea or vomiting Anesthetic complications: no   No notable events documented.  Last Vitals:  Vitals:   06/26/24 0855 06/26/24 0900  BP:  113/61  Pulse: 60 (!) 59  Resp: (!) 0 18  Temp:    SpO2: 95% 96%    Last Pain:  Vitals:   06/26/24 0900  TempSrc:   PainSc: 0-No pain                 Thom JONELLE Peoples

## 2024-06-27 ENCOUNTER — Encounter (HOSPITAL_COMMUNITY): Payer: Self-pay | Admitting: Gastroenterology

## 2024-06-27 ENCOUNTER — Other Ambulatory Visit: Payer: Self-pay | Admitting: Surgery

## 2024-06-27 DIAGNOSIS — I7121 Aneurysm of the ascending aorta, without rupture: Secondary | ICD-10-CM

## 2024-06-28 ENCOUNTER — Encounter (HOSPITAL_COMMUNITY): Payer: Self-pay | Admitting: Radiology

## 2024-07-01 ENCOUNTER — Other Ambulatory Visit: Payer: Self-pay | Admitting: General Surgery

## 2024-07-03 ENCOUNTER — Telehealth: Payer: Self-pay

## 2024-07-03 DIAGNOSIS — N2889 Other specified disorders of kidney and ureter: Secondary | ICD-10-CM | POA: Diagnosis not present

## 2024-07-03 DIAGNOSIS — E119 Type 2 diabetes mellitus without complications: Secondary | ICD-10-CM | POA: Diagnosis not present

## 2024-07-03 DIAGNOSIS — K8070 Calculus of gallbladder and bile duct without cholecystitis without obstruction: Secondary | ICD-10-CM | POA: Diagnosis not present

## 2024-07-03 DIAGNOSIS — I4892 Unspecified atrial flutter: Secondary | ICD-10-CM | POA: Diagnosis not present

## 2024-07-03 DIAGNOSIS — G4733 Obstructive sleep apnea (adult) (pediatric): Secondary | ICD-10-CM | POA: Diagnosis not present

## 2024-07-03 DIAGNOSIS — K862 Cyst of pancreas: Secondary | ICD-10-CM | POA: Diagnosis not present

## 2024-07-03 DIAGNOSIS — K861 Other chronic pancreatitis: Secondary | ICD-10-CM | POA: Diagnosis not present

## 2024-07-03 DIAGNOSIS — K432 Incisional hernia without obstruction or gangrene: Secondary | ICD-10-CM | POA: Diagnosis not present

## 2024-07-03 NOTE — Telephone Encounter (Signed)
   Pre-operative Risk Assessment    Patient Name: Veronica Hansen  DOB: 05/28/1946 MRN: [id number]   Date of last office visit: 10/26/23 VINA GULL, MD Date of next office visit: 07/13/24 VINA GULL, MD   Request for Surgical Clearance    Procedure:  LAPAROSCOPIC CHOLECYSTECTOMY SURGERY  Date of Surgery:  Clearance TBD                                Surgeon:  JINA NEPHEW, MD Surgeon's Group or Practice Name:  [facility] Phone number:  [phone] Fax number:  [phone]  ATTN: LARRAINE ELLEN, CMA   Type of Clearance Requested:   - Medical    Type of Anesthesia:  General    Additional requests/questions:    Signed, Lucie DELENA Ku   07/03/2024, 10:29 AM

## 2024-07-04 NOTE — Telephone Encounter (Signed)
   Name: Veronica Hansen  DOB: 10/15/1946  MRN: [id number]  Primary Cardiologist: Vina Gull, MD  Chart reviewed as part of pre-operative protocol coverage. The patient has an upcoming visit scheduled with Dr. Gull on 07/13/2024 at which time clearance can be addressed in case there are any issues that would impact surgical recommendations.  Laparoscopic cholecystectomy is not scheduled until TBD as below. I added preop FYI to appointment note so that provider is aware to address at time of outpatient visit.  Per office protocol the cardiology provider should forward their finalized clearance decision and recommendations regarding antiplatelet therapy to the requesting party below.    I will route this message as FYI to requesting party and remove this message from the preop box as separate preop APP input not needed at this time.   Please call with any questions.  Lum LITTIE Louis, NP  07/04/2024, 11:09 AM

## 2024-07-07 NOTE — Telephone Encounter (Signed)
 No additional fluid able to be reviewed. No change from plan of action as outlined.

## 2024-07-12 NOTE — Progress Notes (Unsigned)
 Cardiology Office Note   Date:  07/13/2024   ID:  Veronica, Hansen 1946/08/03, MRN [id number]  PCP:  Veronica Toribio [name], MD  Cardiologist:   Veronica Gull, MD   Patient presents for follow up of HTN    History of Present Illness: Veronica Hansen is a 78 y.o. female with a history of DM, HTN, OSA (using CPAP), obesity, hx atrial flutter (s/p ablation in Aug 10, 2018),   Hx stomach stapling in August 10, 2018    Jan 2024  Referred for DOE     PET/CT showed normal perfusion with normal flow reserve.    LVEF 59%, increasing to 65%.  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   escitalopram  (LEXAPRO ) 10 MG tablet Take 10 mg by mouth daily.   ezetimibe  (ZETIA ) 10 MG tablet Take 10 mg by mouth daily.   ferrous sulfate  325 (65 FE) MG tablet Take 325 mg by mouth every other day.   glucose blood (ONETOUCH VERIO) test strip 1 each by Other route in the morning, at noon, in the evening, and at bedtime. Use as instructed   methocarbamol  (ROBAXIN ) 500 MG tablet Take 1 tablet (500 mg total) by mouth every 6 (six) hours as needed for muscle spasms.   metoprolol  succinate (TOPROL -XL) 100 MG 24 hr tablet Take 100 mg by mouth daily.   Multiple Vitamins-Minerals (CENTRUM ADULTS PO) Take 1 tablet by mouth daily.   olmesartan (BENICAR) 40 MG tablet Take 40 mg by mouth daily.   omeprazole  (PRILOSEC) 40  MG capsule Take 1 capsule (40 mg total) by mouth 2 (two) times daily before a meal.   OneTouch Delica Lancets 33G MISC by Does not apply route as directed.   Polyethyl Glycol-Propyl Glycol (SYSTANE OP) Place 1 drop into both eyes as needed (dry eyes).   simvastatin  (ZOCOR ) 40 MG tablet Take 40 mg by mouth daily.   TRULICITY 4.5 MG/0.5ML SOPN Inject 4.5 mg into the skin every 7 (seven) days.     Allergies:   Clindamycin/lincomycin, Orlistat, and Phentermine hcl   Past Medical History:  Diagnosis Date   A-fib (HCC)    A-fib (HCC) 08/10/2018   treated with ablation procedure in October 2019   Allergic rhinitis    Anemia    Arthritis    joints hurt   ASCVD (arteriosclerotic cardiovascular disease)    Atypical chest pain    cardiology consult showed an echocardiogram with left ventricular hypertrophy normal left ventricular function.   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   COPD (chronic obstructive pulmonary disease) (HCC)    Depression    husband died of Covid 08-10-2020   Diabetes mellitus    type  2, no longer takes medication    Diabetes mellitus (HCC)    Dyspnea    GERD (gastroesophageal reflux disease)    History of blood transfusion    HTN (hypertension)    Hypercholesterolemia    Hyperlipidemia    Hypertension    Incontinence    Iron deficiency anemia    Lower esophageal sphincter, relaxation    Morbid obesity (HCC)    OSA (obstructive sleep apnea)    OSA on CPAP 03/07/2014   OSA on CPAP    Proteinuria 2001   Psoriasis    RLL pneumonia 06/2016   Runny nose    seasonal - poss allergies   Sinus problem    SOB (shortness of breath)    Tobacco use    Stopped in 2004    Past Surgical History:  Procedure Laterality Date   A-FLUTTER ABLATION N/A 08/19/2018   Procedure: A-FLUTTER ABLATION;  Surgeon: Waddell Danelle ORN, MD;  [facility]: [facility];  Service: Cardiovascular;  Laterality: N/A;   ATRIAL FLUTTER ABLATION     BARIATRIC SURGERY  2013   dental implants   11/16/2009   ERCP N/A 06/26/2024   Procedure: ERCP, WITH INTERVENTION IF INDICATED;  Surgeon: Charlanne Groom, MD;  Location: [facility] ENDOSCOPY;  Service: Gastroenterology;  Laterality: N/A;   ESOPHAGOGASTRODUODENOSCOPY N/A 04/24/2024   Procedure: EGD (ESOPHAGOGASTRODUODENOSCOPY);  Surgeon: Wilhelmenia Aloha Raddle., MD;  Location: [facility];  Service: Gastroenterology;  Laterality: N/A;   EUS N/A 04/24/2024   Procedure: ULTRASOUND, UPPER GI TRACT, ENDOSCOPIC;  Surgeon: Wilhelmenia Aloha Raddle., MD;  Location: [facility] ENDOSCOPY;  Service: Gastroenterology;  Laterality: N/A;   FINE NEEDLE ASPIRATION  04/24/2024   Procedure: FINE NEEDLE ASPIRATION;  Surgeon: Wilhelmenia, Aloha Raddle., MD;  Location: [facility];  Service: Gastroenterology;;   IR EMBO TUMOR ORGAN ISCHEMIA INFARCT INC GUIDE ROADMAPPING  01/21/2024   IR RADIOLOGIST EVAL & MGMT  12/27/2023   IR RENAL SUPRASEL UNI S&I MOD SED  01/21/2024   IR RENAL SUPRASEL UNI S&I MOD SED  01/21/2024   IR US  GUIDE VASC ACCESS RIGHT  01/21/2024   LARYNX SURGERY     polyp removed   left breast bx was fibrocystic change     left cataract extraction and lens implantation  01/11/2019   ORIF ULNAR FRACTURE Right 02/15/2021   Procedure: OPEN REDUCTION INTERNAL FIXATION (ORIF) ULNAR FRACTURE and distal radius open reduction and internal fixation and repair;  Surgeon: Shari Easter, MD;  Location: [facility] OR;  Service: Orthopedics;  Laterality: Right;  with IV sedation   Right TKR  2007   TOTAL HIP ARTHROPLASTY Right 06/03/2023   Procedure: TOTAL HIP ARTHROPLASTY ANTERIOR APPROACH;  Surgeon: Ernie Cough, MD;  Location: [facility] ORS;  Service: Orthopedics;  Laterality: Right;   TOTAL KNEE ARTHROPLASTY  11/17/2003   left   TOTAL KNEE ARTHROPLASTY  11/16/2005   right     Social History:  The patient  reports that she quit smoking about 23 years ago. Her smoking use included cigarettes. She started smoking about 43 years ago. She has never used smokeless tobacco. She reports that she does not  drink alcohol  and does not use drugs.   Family History:  The patient's family history includes Diabetes in her brother and father.    ROS:  Please see the history of present illness. All other systems are reviewed and  Negative to the above problem except as noted.    PHYSICAL EXAM: VS:  BP 128/60   Pulse (!) 57   Ht  5' 6 (1.676 m)   Wt 273 lb 8 oz (124.1 kg)   SpO2 92%   BMI 44.14 kg/m   GEN: Morbidly obese 78 yo  in no acute distress  HEENT: normal  Neck: no JVD, carotid bruits Cardiac: RRR; no murmur Respiratory:  clear to auscultation  GI: soft, nontender,  No hepatomegaly  Ext  No LE edema   EKG:  EKG shows ectopic atrial rhythm   57 bpm   Firs   Lipid Panel No results found for: CHOL, TRIG, HDL, CHOLHDL, VLDL, LDLCALC, LDLDIRECT    Wt Readings from Last 3 Encounters:  07/13/24 273 lb 8 oz (124.1 kg)  06/26/24 257 lb (116.6 kg)  04/24/24 248 lb 0.3 oz (112.5 kg)      ASSESSMENT AND PLAN:  1 Hx of  Dyspnea  Pt has undergone PET/CT (normal), PFTs (normal)  Breathing is stable    Follow   2  HTN  BP remains well controlled   3  Aortic aneurysm.  Aorta 4.4 cm  on CT in May 2025   Follows with B Bartle in the winter    4  HL  LDL 70  HDL 38    Trig 102  Continue simvistatin and ezetimide  5  DM  Last A1C 5.1  has appt with Veronica Jakie   Off of Trulicity  6  GI   Plan for choley   Not scheduled yet   From a cardiac standpoint I feel she is at low risk for major cardiac event   7  Renal   MRI scheduled to image R kidney     8  Weight  CUt out carbs   Time restrict meals    Current medicines are reviewed at length with the patient today.  The patient does not have concerns regarding medicines.  Signed, Veronica Gull, MD  07/13/2024 11:30 AM    [facility] [address] [place], [place], [place]  [address] Phone: [phone]; Fax: [phone]

## 2024-07-13 ENCOUNTER — Ambulatory Visit: Attending: Cardiology | Admitting: Internal Medicine

## 2024-07-13 ENCOUNTER — Encounter: Payer: Self-pay | Admitting: Internal Medicine

## 2024-07-13 ENCOUNTER — Telehealth: Payer: Self-pay | Admitting: Gastroenterology

## 2024-07-13 VITALS — BP 128/60 | HR 57 | Ht 66.0 in | Wt 273.5 lb

## 2024-07-13 DIAGNOSIS — Z01810 Encounter for preprocedural cardiovascular examination: Secondary | ICD-10-CM | POA: Diagnosis not present

## 2024-07-13 DIAGNOSIS — I4892 Unspecified atrial flutter: Secondary | ICD-10-CM | POA: Diagnosis not present

## 2024-07-13 NOTE — Telephone Encounter (Signed)
 Returned call to patient & she has concerns for occasional, small amounts of bright red rectal bleeding and irritation. Denies any pain, n/v. She also feels that her questions regarding her health & ongoing diarrhea have not been fully answered and would like to schedule a f/u. Previous colon report does show internal hemorrhoids. Scheduled OV for 9/22 at 3:00 pm with Deanna, NP & advised she try prep H suppositories/cream in the meantime. Pt verbalized all understanding, no further questions.

## 2024-07-13 NOTE — Telephone Encounter (Signed)
 Inbound call from patient stating that she had a bowel movement this morning and noticed she had a lot of red blood in her stool. Patient is requesting a call back to discuss. Please advise.

## 2024-07-13 NOTE — Patient Instructions (Signed)
 Medication Instructions:  Your physician recommends that you continue on your current medications as directed. Please refer to the Current Medication list given to you today.  *If you need a refill on your cardiac medications before your next appointment, please call your pharmacy*  Follow-Up: At [facility], you and your health needs are our priority.  As part of our continuing mission to provide you with exceptional heart care, our providers are all part of one team.  This team includes your primary Cardiologist (physician) and Advanced Practice Providers or APPs (Physician Assistants and Nurse Practitioners) who all work together to provide you with the care you need, when you need it.  Your next appointment:   1 year(s)  Provider:   Vina Gull, MD   We recommend signing up for the patient portal called MyChart.  Sign up information is provided on this After Visit Summary.  MyChart is used to connect with patients for Virtual Visits (Telemedicine).  Patients are able to view lab/test results, encounter notes, upcoming appointments, etc.  Non-urgent messages can be sent to your provider as well.    To learn more about what you can do with MyChart, go to [email or web address].

## 2024-07-27 ENCOUNTER — Ambulatory Visit (HOSPITAL_COMMUNITY)

## 2024-08-02 ENCOUNTER — Ambulatory Visit

## 2024-08-07 ENCOUNTER — Ambulatory Visit
Admission: RE | Admit: 2024-08-07 | Discharge: 2024-08-07 | Disposition: A | Discharge status: Home / Self Care | Source: Ambulatory Visit | Attending: Interventional Radiology | Admitting: Interventional Radiology

## 2024-08-07 DIAGNOSIS — D1771 Benign lipomatous neoplasm of kidney: Secondary | ICD-10-CM

## 2024-08-08 ENCOUNTER — Other Ambulatory Visit: Payer: Self-pay

## 2024-08-08 ENCOUNTER — Inpatient Hospital Stay (HOSPITAL_BASED_OUTPATIENT_CLINIC_OR_DEPARTMENT_OTHER): Admission: EM | Admit: 2024-08-08 | Discharge: 2024-08-16 | DRG: 177 | Disposition: A

## 2024-08-08 ENCOUNTER — Ambulatory Visit: Admitting: Gastroenterology

## 2024-08-08 ENCOUNTER — Encounter (HOSPITAL_COMMUNITY): Payer: Self-pay | Admitting: Student

## 2024-08-08 ENCOUNTER — Emergency Department (HOSPITAL_BASED_OUTPATIENT_CLINIC_OR_DEPARTMENT_OTHER)

## 2024-08-08 ENCOUNTER — Emergency Department (HOSPITAL_BASED_OUTPATIENT_CLINIC_OR_DEPARTMENT_OTHER): Admitting: Radiology

## 2024-08-08 DIAGNOSIS — I10 Essential (primary) hypertension: Secondary | ICD-10-CM | POA: Diagnosis present

## 2024-08-08 DIAGNOSIS — J1282 Pneumonia due to coronavirus disease 2019: Secondary | ICD-10-CM | POA: Diagnosis not present

## 2024-08-08 DIAGNOSIS — J440 Chronic obstructive pulmonary disease with acute lower respiratory infection: Secondary | ICD-10-CM | POA: Diagnosis present

## 2024-08-08 DIAGNOSIS — Z23 Encounter for immunization: Secondary | ICD-10-CM

## 2024-08-08 DIAGNOSIS — J9601 Acute respiratory failure with hypoxia: Secondary | ICD-10-CM | POA: Diagnosis not present

## 2024-08-08 DIAGNOSIS — E7800 Pure hypercholesterolemia, unspecified: Secondary | ICD-10-CM | POA: Diagnosis present

## 2024-08-08 DIAGNOSIS — Z9884 Bariatric surgery status: Secondary | ICD-10-CM

## 2024-08-08 DIAGNOSIS — Z881 Allergy status to other antibiotic agents status: Secondary | ICD-10-CM

## 2024-08-08 DIAGNOSIS — Z79899 Other long term (current) drug therapy: Secondary | ICD-10-CM

## 2024-08-08 DIAGNOSIS — R0989 Other specified symptoms and signs involving the circulatory and respiratory systems: Secondary | ICD-10-CM | POA: Diagnosis not present

## 2024-08-08 DIAGNOSIS — I4891 Unspecified atrial fibrillation: Principal | ICD-10-CM | POA: Diagnosis present

## 2024-08-08 DIAGNOSIS — Z888 Allergy status to other drugs, medicaments and biological substances status: Secondary | ICD-10-CM

## 2024-08-08 DIAGNOSIS — R5381 Other malaise: Secondary | ICD-10-CM | POA: Diagnosis present

## 2024-08-08 DIAGNOSIS — Z833 Family history of diabetes mellitus: Secondary | ICD-10-CM

## 2024-08-08 DIAGNOSIS — Z96651 Presence of right artificial knee joint: Secondary | ICD-10-CM | POA: Diagnosis present

## 2024-08-08 DIAGNOSIS — R590 Localized enlarged lymph nodes: Secondary | ICD-10-CM | POA: Diagnosis not present

## 2024-08-08 DIAGNOSIS — Z87891 Personal history of nicotine dependence: Secondary | ICD-10-CM

## 2024-08-08 DIAGNOSIS — E66813 Obesity, class 3: Secondary | ICD-10-CM | POA: Diagnosis present

## 2024-08-08 DIAGNOSIS — K8020 Calculus of gallbladder without cholecystitis without obstruction: Secondary | ICD-10-CM | POA: Diagnosis not present

## 2024-08-08 DIAGNOSIS — R0603 Acute respiratory distress: Secondary | ICD-10-CM

## 2024-08-08 DIAGNOSIS — J9811 Atelectasis: Secondary | ICD-10-CM | POA: Diagnosis not present

## 2024-08-08 DIAGNOSIS — K219 Gastro-esophageal reflux disease without esophagitis: Secondary | ICD-10-CM | POA: Diagnosis present

## 2024-08-08 DIAGNOSIS — J159 Unspecified bacterial pneumonia: Secondary | ICD-10-CM | POA: Diagnosis present

## 2024-08-08 DIAGNOSIS — I2510 Atherosclerotic heart disease of native coronary artery without angina pectoris: Secondary | ICD-10-CM | POA: Diagnosis not present

## 2024-08-08 DIAGNOSIS — I959 Hypotension, unspecified: Secondary | ICD-10-CM | POA: Diagnosis present

## 2024-08-08 DIAGNOSIS — G4733 Obstructive sleep apnea (adult) (pediatric): Secondary | ICD-10-CM | POA: Diagnosis present

## 2024-08-08 DIAGNOSIS — Z634 Disappearance and death of family member: Secondary | ICD-10-CM

## 2024-08-08 DIAGNOSIS — Z7985 Long-term (current) use of injectable non-insulin antidiabetic drugs: Secondary | ICD-10-CM

## 2024-08-08 DIAGNOSIS — U071 COVID-19: Principal | ICD-10-CM | POA: Diagnosis present

## 2024-08-08 DIAGNOSIS — Z6841 Body Mass Index (BMI) 40.0 and over, adult: Secondary | ICD-10-CM

## 2024-08-08 DIAGNOSIS — R918 Other nonspecific abnormal finding of lung field: Secondary | ICD-10-CM | POA: Diagnosis not present

## 2024-08-08 DIAGNOSIS — Z96641 Presence of right artificial hip joint: Secondary | ICD-10-CM | POA: Diagnosis present

## 2024-08-08 DIAGNOSIS — F32A Depression, unspecified: Secondary | ICD-10-CM | POA: Diagnosis present

## 2024-08-08 DIAGNOSIS — R0602 Shortness of breath: Secondary | ICD-10-CM | POA: Diagnosis not present

## 2024-08-08 DIAGNOSIS — D509 Iron deficiency anemia, unspecified: Secondary | ICD-10-CM | POA: Diagnosis present

## 2024-08-08 DIAGNOSIS — L409 Psoriasis, unspecified: Secondary | ICD-10-CM | POA: Diagnosis present

## 2024-08-08 DIAGNOSIS — E119 Type 2 diabetes mellitus without complications: Secondary | ICD-10-CM | POA: Diagnosis present

## 2024-08-08 LAB — URINALYSIS, ROUTINE W REFLEX MICROSCOPIC
Bacteria, UA: NONE SEEN
Bilirubin Urine: NEGATIVE
Glucose, UA: NEGATIVE mg/dL
Ketones, ur: NEGATIVE mg/dL
Leukocytes,Ua: NEGATIVE
Nitrite: NEGATIVE
Protein, ur: 100 mg/dL — AB
Specific Gravity, Urine: >1.046 — ABNORMAL HIGH (ref 1.005–1.030)
pH: 6 (ref 5.0–8.0)

## 2024-08-08 LAB — CBC WITH DIFFERENTIAL/PLATELET
Abs Immature Granulocytes: 0.08 K/uL — ABNORMAL HIGH (ref 0.00–0.07)
Basophils Absolute: 0 K/uL (ref 0.0–0.1)
Basophils Relative: 0 %
Eosinophils Absolute: 0.1 K/uL (ref 0.0–0.5)
Eosinophils Relative: 1 %
HCT: 38.9 % (ref 36.0–46.0)
Hemoglobin: 12.3 g/dL (ref 12.0–15.0)
Immature Granulocytes: 1 %
Lymphocytes Relative: 10 %
Lymphs Abs: 1.4 K/uL (ref 0.7–4.0)
MCH: 26.2 pg (ref 26.0–34.0)
MCHC: 31.6 g/dL (ref 30.0–36.0)
MCV: 82.8 fL (ref 80.0–100.0)
Monocytes Absolute: 1.2 K/uL — ABNORMAL HIGH (ref 0.1–1.0)
Monocytes Relative: 9 %
Neutro Abs: 10.9 K/uL — ABNORMAL HIGH (ref 1.7–7.7)
Neutrophils Relative %: 79 %
Platelets: 258 K/uL (ref 150–400)
RBC: 4.7 MIL/uL (ref 3.87–5.11)
RDW: 14.4 % (ref 11.5–15.5)
WBC: 13.8 K/uL — ABNORMAL HIGH (ref 4.0–10.5)
nRBC: 0 % (ref 0.0–0.2)

## 2024-08-08 LAB — COMPREHENSIVE METABOLIC PANEL WITH GFR
ALT: 10 U/L (ref 0–44)
AST: 21 U/L (ref 15–41)
Albumin: 4.2 g/dL (ref 3.5–5.0)
Alkaline Phosphatase: 105 U/L (ref 38–126)
Anion gap: 14 (ref 5–15)
BUN: 11 mg/dL (ref 8–23)
CO2: 25 mmol/L (ref 22–32)
Calcium: 9.8 mg/dL (ref 8.9–10.3)
Chloride: 95 mmol/L — ABNORMAL LOW (ref 98–111)
Creatinine, Ser: 0.62 mg/dL (ref 0.44–1.00)
GFR, Estimated: >60 mL/min (ref >60)
Glucose, Bld: 125 mg/dL — ABNORMAL HIGH (ref 70–99)
Potassium: 3.7 mmol/L (ref 3.5–5.1)
Sodium: 134 mmol/L — ABNORMAL LOW (ref 135–145)
Total Bilirubin: 1.5 mg/dL — ABNORMAL HIGH (ref 0.0–1.2)
Total Protein: 7.9 g/dL (ref 6.5–8.1)

## 2024-08-08 LAB — RESP PANEL BY RT-PCR (RSV, FLU A&B, COVID)  RVPGX2
Influenza A by PCR: NEGATIVE
Influenza B by PCR: NEGATIVE
Resp Syncytial Virus by PCR: NEGATIVE
SARS Coronavirus 2 by RT PCR: POSITIVE — AB

## 2024-08-08 LAB — TROPONIN T, HIGH SENSITIVITY
Troponin T High Sensitivity: <15 ng/L (ref 0–19)
Troponin T High Sensitivity: <15 ng/L (ref 0–19)

## 2024-08-08 LAB — LIPASE, BLOOD: Lipase: 26 U/L (ref 11–51)

## 2024-08-08 LAB — PRO BRAIN NATRIURETIC PEPTIDE: Pro Brain Natriuretic Peptide: 493 pg/mL — ABNORMAL HIGH (ref <300.0)

## 2024-08-08 LAB — PROCALCITONIN: Procalcitonin: <0.1 ng/mL

## 2024-08-08 MED ORDER — IPRATROPIUM-ALBUTEROL 0.5-2.5 (3) MG/3ML IN SOLN
RESPIRATORY_TRACT | Status: AC
  Filled 2024-08-08: qty 6

## 2024-08-08 MED ORDER — POLYETHYLENE GLYCOL 3350 17 G PO PACK: 17.0000 g | PACK | Freq: Every day | ORAL | Status: DC | PRN

## 2024-08-08 MED ORDER — SODIUM CHLORIDE 0.9 % IV SOLN
1.0000 g | Freq: Once | INTRAVENOUS | Status: AC
  Administered 2024-08-08: 1 g via INTRAVENOUS
  Filled 2024-08-08: qty 10

## 2024-08-08 MED ORDER — PROCHLORPERAZINE EDISYLATE 10 MG/2ML IJ SOLN: 5.0000 mg | Freq: Four times a day (QID) | INTRAMUSCULAR | Status: DC | PRN

## 2024-08-08 MED ORDER — IPRATROPIUM-ALBUTEROL 0.5-2.5 (3) MG/3ML IN SOLN
3.0000 mL | Freq: Four times a day (QID) | RESPIRATORY_TRACT | Status: DC
  Administered 2024-08-09 – 2024-08-11 (×9): 3 mL via RESPIRATORY_TRACT
  Filled 2024-08-08 (×9): qty 3

## 2024-08-08 MED ORDER — BENZONATATE 100 MG PO CAPS
100.0000 mg | ORAL_CAPSULE | Freq: Three times a day (TID) | ORAL | Status: DC | PRN
  Administered 2024-08-08 – 2024-08-10 (×4): 100 mg via ORAL
  Filled 2024-08-08 (×4): qty 1

## 2024-08-08 MED ORDER — SODIUM CHLORIDE 0.9 % IV SOLN
500.0000 mg | Freq: Once | INTRAVENOUS | Status: AC
  Administered 2024-08-08: 500 mg via INTRAVENOUS
  Filled 2024-08-08: qty 5

## 2024-08-08 MED ORDER — IOHEXOL 350 MG/ML SOLN
100.0000 mL | Freq: Once | INTRAVENOUS | Status: AC | PRN
  Administered 2024-08-08: 100 mL via INTRAVENOUS

## 2024-08-08 MED ORDER — ACETAMINOPHEN 325 MG PO TABS
650.0000 mg | ORAL_TABLET | Freq: Four times a day (QID) | ORAL | Status: DC | PRN
  Administered 2024-08-09: 650 mg via ORAL
  Filled 2024-08-08: qty 2

## 2024-08-08 MED ORDER — IPRATROPIUM-ALBUTEROL 0.5-2.5 (3) MG/3ML IN SOLN
6.0000 mL | Freq: Once | RESPIRATORY_TRACT | Status: AC
  Administered 2024-08-08: 6 mL via RESPIRATORY_TRACT

## 2024-08-08 MED ORDER — ENOXAPARIN SODIUM 60 MG/0.6ML IJ SOSY
60.0000 mg | PREFILLED_SYRINGE | INTRAMUSCULAR | Status: DC
  Administered 2024-08-08 – 2024-08-15 (×8): 60 mg via SUBCUTANEOUS
  Filled 2024-08-08 (×8): qty 0.6

## 2024-08-08 MED ORDER — MELATONIN 5 MG PO TABS
5.0000 mg | ORAL_TABLET | Freq: Every evening | ORAL | Status: DC | PRN
  Administered 2024-08-09 – 2024-08-11 (×2): 5 mg via ORAL
  Filled 2024-08-08 (×2): qty 1

## 2024-08-08 MED ORDER — IPRATROPIUM-ALBUTEROL 0.5-2.5 (3) MG/3ML IN SOLN: 3.0000 mL | Freq: Four times a day (QID) | RESPIRATORY_TRACT | Status: DC

## 2024-08-08 MED ORDER — IPRATROPIUM-ALBUTEROL 0.5-2.5 (3) MG/3ML IN SOLN: 3.0000 mL | RESPIRATORY_TRACT | Status: DC | PRN

## 2024-08-08 MED ORDER — PHENOL 1.4 % MT LIQD
1.0000 | OROMUCOSAL | Status: DC | PRN
  Administered 2024-08-09: 1 via OROMUCOSAL
  Filled 2024-08-08: qty 177

## 2024-08-08 MED ORDER — SODIUM CHLORIDE 0.9 % IV SOLN
500.0000 mg | INTRAVENOUS | Status: DC
  Administered 2024-08-09 – 2024-08-10 (×2): 500 mg via INTRAVENOUS
  Filled 2024-08-08 (×2): qty 5

## 2024-08-08 MED ORDER — SODIUM CHLORIDE 0.9 % IV SOLN
2.0000 g | INTRAVENOUS | Status: AC
  Administered 2024-08-09 – 2024-08-15 (×7): 2 g via INTRAVENOUS
  Filled 2024-08-08 (×7): qty 20

## 2024-08-08 NOTE — ED Notes (Signed)
 Called [facility] to transport the patient to [facility] rm# [id number]

## 2024-08-08 NOTE — ED Triage Notes (Addendum)
 Tested positive for COVID on Friday. C/o cough and congestion since. Oxygen sat 84% on RA.   Hx of COPD. Started paxlovid

## 2024-08-08 NOTE — H&P (Signed)
 History and Physical  Veronica Hansen FMW:[id number] DOB: 1946-09-23 DOA: 08/08/2024  Referring physician: Dr. Gonfa, [facility], Hospitalist service.  PCP: Yolande Toribio [name], MD  Outpatient Specialists: Cardiology, GI, cardiothoracic surgery. Patient coming from: Home.  Chief Complaint: Worsening cough and shortness of breath.  HPI: Veronica Hansen is a 78 y.o. female with medical history significant for recently diagnosed COVID-19 viral infection on 08/04/2024 (day 5/5 of Paxlovid), type 2 diabetes, hypertension, history of atrial flutter status post ablation in 2018/08/26, obesity with history of stomach stapling in 08/26/2018, OSA on CPAP, 4.6 cm aortic aneurysm, who presents to the ER with worsening, productive cough since she was diagnosed with COVID-19 viral infection.  Associated with shortness of breath with minimal exertion.  No reported anginal symptoms.  She initially presented at [facility] ED where she was found to be hypoxic with O2 saturation in the mid 80s.  She received bronchodilator nebulizers.  COVID-19 screening test was positive.  CT angio chest was negative for pulmonary embolism however, revealed left infiltrates suggestive of pneumonia.  Patient was started on Rocephin  and IV azithromycin  empirically.  Excepted by Dr. Gonfa and transferred to [facility] telemetry medical unit as observation status.  ED Course: Temperature 98.5.  BP 91/56, pulse 92, respiratory 18, O2 saturation 88% on 4 L nasal cannula.  Review of Systems: Review of systems as noted in the HPI. All other systems reviewed and are negative.   Past Medical History:  Diagnosis Date   A-fib (HCC)    A-fib (HCC) August 26, 2018   treated with ablation procedure in October 2019   Allergic rhinitis    Anemia    Arthritis    joints hurt   ASCVD (arteriosclerotic cardiovascular disease)    Atypical chest pain    cardiology consult showed an echocardiogram with left ventricular hypertrophy normal left ventricular  function.   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   COPD (chronic obstructive pulmonary disease) (HCC)    Depression    husband died of Covid 26-Aug-2020   Diabetes mellitus    type 2, no longer takes medication    Diabetes mellitus (HCC)    Dyspnea    GERD (gastroesophageal reflux disease)    History of blood transfusion    HTN (hypertension)    Hypercholesterolemia    Hyperlipidemia    Hypertension    Incontinence    Iron deficiency anemia    Lower esophageal sphincter, relaxation    Morbid obesity (HCC)    OSA (obstructive sleep apnea)    OSA on CPAP 03/07/2014   OSA on CPAP    Proteinuria 2001   Psoriasis    RLL pneumonia 06/2016   Runny nose    seasonal - poss allergies   Sinus problem    SOB (shortness of breath)    Tobacco use    Stopped in 27-Aug-2003   Past Surgical History:  Procedure Laterality Date   A-FLUTTER ABLATION N/A 08/19/2018   Procedure: A-FLUTTER ABLATION;  Surgeon: Waddell Danelle ORN, MD;  Location: [facility] INVASIVE CV LAB;  Service: Cardiovascular;  Laterality: N/A;   ATRIAL FLUTTER ABLATION     BARIATRIC SURGERY  08/26/12   dental implants  11/16/2009   ERCP N/A 06/26/2024   Procedure: ERCP, WITH INTERVENTION IF INDICATED;  Surgeon: Charlanne Groom, MD;  Location: [facility] ENDOSCOPY;  Service: Gastroenterology;  Laterality: N/A;   ESOPHAGOGASTRODUODENOSCOPY N/A 04/24/2024   Procedure: EGD (ESOPHAGOGASTRODUODENOSCOPY);  Surgeon: Wilhelmenia Aloha Raddle., MD;  Location: [facility];  Service: Gastroenterology;  Laterality: N/A;   EUS N/A 04/24/2024   Procedure: ULTRASOUND, UPPER GI TRACT, ENDOSCOPIC;  Surgeon: Wilhelmenia Aloha Raddle., MD;  Location: [facility] ENDOSCOPY;  Service: Gastroenterology;  Laterality: N/A;   FINE NEEDLE ASPIRATION  04/24/2024   Procedure: FINE NEEDLE ASPIRATION;  Surgeon: Wilhelmenia, Aloha Raddle., MD;  Location: [facility];  Service: Gastroenterology;;   IR EMBO TUMOR ORGAN ISCHEMIA INFARCT INC GUIDE ROADMAPPING  01/21/2024   IR RADIOLOGIST EVAL & MGMT  12/27/2023   IR  RENAL SUPRASEL UNI S&I MOD SED  01/21/2024   IR RENAL SUPRASEL UNI S&I MOD SED  01/21/2024   IR US  GUIDE VASC ACCESS RIGHT  01/21/2024   LARYNX SURGERY     polyp removed   left breast bx was fibrocystic change     left cataract extraction and lens implantation  01/11/2019   ORIF ULNAR FRACTURE Right 02/15/2021   Procedure: OPEN REDUCTION INTERNAL FIXATION (ORIF) ULNAR FRACTURE and distal radius open reduction and internal fixation and repair;  Surgeon: Shari Easter, MD;  Location: [facility] OR;  Service: Orthopedics;  Laterality: Right;  with IV sedation   Right TKR  2007   TOTAL HIP ARTHROPLASTY Right 06/03/2023   Procedure: TOTAL HIP ARTHROPLASTY ANTERIOR APPROACH;  Surgeon: Ernie Cough, MD;  Location: [facility] ORS;  Service: Orthopedics;  Laterality: Right;   TOTAL KNEE ARTHROPLASTY  11/17/2003   left   TOTAL KNEE ARTHROPLASTY  11/16/2005   right    Social History:  reports that she quit smoking about 23 years ago. Her smoking use included cigarettes. She started smoking about 44 years ago. She has never used smokeless tobacco. She reports that she does not drink alcohol  and does not use drugs.   Allergies  Allergen Reactions   Clindamycin/Lincomycin Itching   Orlistat Diarrhea    Other reaction(s): severe diarrhea   Phentermine Hcl     Other reaction(s): elevated BP    Family History  Problem Relation Age of Onset   Diabetes Father    Diabetes Brother    Colon cancer Neg Hx    Rectal cancer Neg Hx    Stomach cancer Neg Hx       Prior to Admission medications   Medication Sig Start Date End Date Taking? Authorizing Provider  PAXLOVID, 300/100, 20 x 150 MG & 10 x 100MG  TBPK Take 3 tablets by mouth 2 (two) times daily. 08/04/24  Yes [provider]  amLODipine  (NORVASC ) 5 MG tablet TAKE 1 TABLET BY MOUTH DAILY 09/06/23   Okey Vina GAILS, MD  Calcium-Vitamin D-Vitamin K (VIACTIV CALCIUM PLUS D) 650-12.5-40 MG-MCG-MCG CHEW Chew 1 tablet by mouth daily.    [provider]   escitalopram  (LEXAPRO ) 10 MG tablet Take 10 mg by mouth daily. 10/04/20   [provider]  ezetimibe  (ZETIA ) 10 MG tablet Take 10 mg by mouth daily. 01/09/21   [provider]  ferrous sulfate  325 (65 FE) MG tablet Take 325 mg by mouth every other day.    [provider]  glucose blood (ONETOUCH VERIO) test strip 1 each by Other route in the morning, at noon, in the evening, and at bedtime. Use as instructed    [provider]  methocarbamol  (ROBAXIN ) 500 MG tablet Take 1 tablet (500 mg total) by mouth every 6 (six) hours as needed for muscle spasms. 06/04/23   Patti Rosina SAUNDERS, PA-C  metoprolol  succinate (TOPROL -XL) 100 MG 24 hr tablet Take 100 mg by mouth daily. 02/05/21   [provider]  Multiple Vitamins-Minerals (CENTRUM  ADULTS PO) Take 1 tablet by mouth daily.    [provider]  olmesartan (BENICAR) 40 MG tablet Take 40 mg by mouth daily. 01/09/21   [provider]  omeprazole  (PRILOSEC) 40 MG capsule Take 1 capsule (40 mg total) by mouth 2 (two) times daily before a meal. 04/24/24   Mansouraty, Aloha Raddle., MD  OneTouch Delica Lancets 33G MISC by Does not apply route as directed.    [provider]  Polyethyl Glycol-Propyl Glycol (SYSTANE OP) Place 1 drop into both eyes as needed (dry eyes).    [provider]  simvastatin  (ZOCOR ) 40 MG tablet Take 40 mg by mouth daily.    [provider]  TRULICITY 4.5 MG/0.5ML SOPN Inject 4.5 mg into the skin every 7 (seven) days. 07/14/22   [provider]    Physical Exam: BP 110/87 (BP Location: Left Arm)   Pulse 90   Temp 98.9 F (37.2 C) (Oral)   Resp 20   Ht 5' 6 (1.676 m)   Wt 121.6 kg   SpO2 94%   BMI 43.26 kg/m   General: 78 y.o. year-old female well developed well nourished in no acute distress.  Alert and oriented x3. Cardiovascular: Regular rate and rhythm with no rubs or gallops.  No thyromegaly or JVD noted.  Trace lower extremity edema  bilaterally.   Respiratory: Mild rales at bases.  Good inspiratory effort. Abdomen: Soft nontender nondistended with normal bowel sounds x4 quadrants. Muskuloskeletal: No cyanosis, clubbing or edema noted bilaterally Neuro: CN II-XII intact, strength, sensation, reflexes Skin: No ulcerative lesions noted or rashes Psychiatry: Judgement and insight appear normal. Mood is appropriate for condition and setting          Labs on Admission:  Basic Metabolic Panel: Recent Labs  Lab 08/08/24 1217  NA 134*  K 3.7  CL 95*  CO2 25  GLUCOSE 125*  BUN 11  CREATININE 0.62  CALCIUM 9.8   Liver Function Tests: Recent Labs  Lab 08/08/24 1217  AST 21  ALT 10  ALKPHOS 105  BILITOT 1.5*  PROT 7.9  ALBUMIN 4.2   Recent Labs  Lab 08/08/24 1217  LIPASE 26   No results for input(s): AMMONIA in the last 168 hours. CBC: Recent Labs  Lab 08/08/24 1217  WBC 13.8*  NEUTROABS 10.9*  HGB 12.3  HCT 38.9  MCV 82.8  PLT 258   Cardiac Enzymes: No results for input(s): CKTOTAL, CKMB, CKMBINDEX, TROPONINI in the last 168 hours.  BNP (last 3 results) No results for input(s): BNP in the last 8760 hours.  ProBNP (last 3 results) Recent Labs    08/08/24 1217  PROBNP 493.0*    CBG: No results for input(s): GLUCAP in the last 168 hours.  Radiological Exams on Admission: CT Angio Chest PE W and/or Wo Contrast Result Date: 08/08/2024 CLINICAL DATA:  Pulmonary embolism (PE) suspected, high prob EXAM: CT ANGIOGRAPHY CHEST WITH CONTRAST TECHNIQUE: Multidetector CT imaging of the chest was performed using the standard protocol during bolus administration of intravenous contrast. Multiplanar CT image reconstructions and MIPs were obtained to evaluate the vascular anatomy. RADIATION DOSE REDUCTION: This exam was performed according to the departmental dose-optimization program which includes automated exposure control, adjustment of the mA and/or kV according to patient size and/or  use of iterative reconstruction technique. CONTRAST:  OMNIPAQUE  IOHEXOL  350 MG/ML SOLN COMPARISON:  03/16/2024 FINDINGS: Pulmonary Embolism: No pulmonary embolism. Cardiovascular: No cardiomegaly or pericardial effusion.Normal variant 2 vessel aortic arch anatomy with the  left common carotid arising from the brachiocephalic artery.Similar fusiform dilation of the ascending aorta, measuring 4.3 cm. Diffuse aortic and dense multi-vessel coronary atherosclerosis. Mediastinum/Nodes: No mediastinal mass.Small hiatal hernia.Interval enlargement of the bilateral hilar lymph nodes. For example, there is a 1.2 cm right suprahilar lymph node (axial 65). 1.6 cm left hilar lymph node (axial 73). Lungs/Pleura: The trachea is midline and patent. Diffuse bronchial wall thickening throughout the lungs. Scattered regions of mucous plugging in the right lower lobe bronchi. Obstructive mucous plugging in the left lower lobe bronchus with dense airspace consolidation in the left lower lobe. No pleural effusion or pneumothorax. Musculoskeletal: No acute fracture or destructive bone lesion. Osteopenia. Thoracic DISH. Multilevel degenerative disc disease of the spine. Upper Abdomen: No acute abnormality in the partially visualized upper abdomen. 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 Cholecystolithiasis. Review of the MIP images confirms the above findings. IMPRESSION: 1. Dense airspace consolidation filling the majority of the left lower lobe with regions of mucous plugging in the bilateral lower lobe bronchi, most likely reflecting pneumonia. No parapneumonic effusion. A 2 view chest radiograph in 6-12 weeks is recommended to document resolution once treatment is complete. 2. Interval enlargement of a couple of bilateral hilar lymph nodes measuring up to 1.6 cm on the left, likely reactive. 3. 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 4. Cholecystolithiasis. Aortic Atherosclerosis  (ICD10-I70.0). Electronically Signed   By: Rogelia Myers M.D.   On: 08/08/2024 15:14   DG Chest 2 View Result Date: 08/08/2024 CLINICAL DATA:  Shortness of breath EXAM: CHEST - 2 VIEW COMPARISON:  CT angio Mar 16, 2024. FINDINGS: Enlarged cardiomediastinal silhouette. Aortic knob is calcified. Increased opacity of posterior segment of left lower lobe suggestive of consolidation/atelectasis. Blunting of left costophrenic angle suggestive of scarring/small effusion. Bilateral vascular congestion. No suspicious osseous lesion. IMPRESSION: Posterior segment of left lower lobe consolidation/atelectasis (query pneumonia). Small pleural effusion on the left can not be excluded. Electronically Signed   By: Megan  Zare M.D.   On: 08/08/2024 13:29    EKG: I independently viewed the EKG done and my findings are as followed: Atrial fibrillation rate of 105.  Nonspecific ST-T changes.  QTc 425.  Assessment/Plan Present on Admission:  Acute respiratory failure with hypoxia (HCC)  Principal Problem:   Acute respiratory failure with hypoxia (HCC)  Acute hypoxic respiratory failure secondary to COVID-19 viral infection superimposed on bacterial pulmonary infection, POA Oxygen saturations in the 80s. Maintain  O2 saturation above 92% Incentive spirometer Early mobilization Bronchodilator nebulizers Solu-Medrol  40 mg x 1  COVID-19 viral infection First diagnosis on 08/04/2024, outpatient After 08/14/24, DC airborne contact isolation  Severe morbid obesity BMI 43 Recommend weight loss outpatient with regular physical activity and healthy dieting.  Leukocytosis WBC 13.8, neutrophil count 10.9 Continue to treat the pneumonia   Time: 75 minutes.   DVT prophylaxis: Subcu Lovenox  daily.  Code Status: Full code.  Family Communication: None at bedside  Disposition Plan: Admitted to telemetry medical unit as observation status.  Consults called: None.  Admission status: Observation  status.   Status is: Observation    Terry LOISE Hurst MD [facility] Pager [phone]  If 7PM-7AM, please contact night-coverage [email or web address] Password [email or web address]  08/08/2024, 8:52 PM

## 2024-08-08 NOTE — ED Provider Notes (Signed)
 [facility] AT [facility] Provider Note   CSN: [id number] Arrival date & time: 08/08/24  1050     Patient presents with: Shortness of Breath   Veronica Hansen is a 78 y.o. female.  78 year old female presents to ED with complaints of cough congestion chills and headache since Friday.  Patient reports she was exposed to COVID last week and Friday had a cough and was prescribed Paxlovid.  Since then cough and congestion has gotten worse.  Patient has significant history of COPD, A-fib, diabetes, pancreas lesions.  Patient advises she has been monitoring her O2 saturation at home and has stayed 94/95%.  Patient was concern for continued congestion and shortness of breath and came to ED for further evaluation.    Prior to Admission medications   Medication Sig Start Date End Date Taking? Authorizing Provider  PAXLOVID, 300/100, 20 x 150 MG & 10 x 100MG  TBPK Take 3 tablets by mouth 2 (two) times daily. 08/04/24  Yes [provider]  amLODipine  (NORVASC ) 5 MG tablet TAKE 1 TABLET BY MOUTH DAILY 09/06/23   Okey Vina GAILS, MD  Calcium-Vitamin D-Vitamin K (VIACTIV CALCIUM PLUS D) 650-12.5-40 MG-MCG-MCG CHEW Chew 1 tablet by mouth daily.    [provider]  escitalopram  (LEXAPRO ) 10 MG tablet Take 10 mg by mouth daily. 10/04/20   [provider]  ezetimibe  (ZETIA ) 10 MG tablet Take 10 mg by mouth daily. 01/09/21   [provider]  ferrous sulfate  325 (65 FE) MG tablet Take 325 mg by mouth every other day.    [provider]  glucose blood (ONETOUCH VERIO) test strip 1 each by Other route in the morning, at noon, in the evening, and at bedtime. Use as instructed    [provider]  methocarbamol  (ROBAXIN ) 500 MG tablet Take 1 tablet (500 mg total) by mouth every 6 (six) hours as needed for muscle spasms. 06/04/23   Patti Rosina SAUNDERS, PA-C  metoprolol  succinate (TOPROL -XL) 100 MG 24 hr tablet Take 100 mg by mouth daily.  02/05/21   [provider]  Multiple Vitamins-Minerals (CENTRUM ADULTS PO) Take 1 tablet by mouth daily.    [provider]  olmesartan (BENICAR) 40 MG tablet Take 40 mg by mouth daily. 01/09/21   [provider]  omeprazole  (PRILOSEC) 40 MG capsule Take 1 capsule (40 mg total) by mouth 2 (two) times daily before a meal. 04/24/24   Mansouraty, Aloha Raddle., MD  OneTouch Delica Lancets 33G MISC by Does not apply route as directed.    [provider]  Polyethyl Glycol-Propyl Glycol (SYSTANE OP) Place 1 drop into both eyes as needed (dry eyes).    [provider]  simvastatin  (ZOCOR ) 40 MG tablet Take 40 mg by mouth daily.    [provider]  TRULICITY 4.5 MG/0.5ML SOPN Inject 4.5 mg into the skin every 7 (seven) days. 07/14/22   [provider]    Allergies: Clindamycin/lincomycin, Orlistat, and Phentermine hcl    Review of Systems  Constitutional:  Positive for chills.  HENT:  Positive for congestion.   Respiratory:  Positive for shortness of breath and wheezing.   Neurological:  Positive for headaches.  All other systems reviewed and are negative.   Updated Vital Signs BP 117/70 (BP Location: Right Arm)   Pulse 86   Temp 99.1 F (37.3 C) (Oral)   Resp 15   SpO2 91%   Physical Exam Vitals and nursing note reviewed.  Constitutional:  Appearance: Normal appearance.  HENT:     Head: Normocephalic and atraumatic.     Nose: Nose normal.     Mouth/Throat:     Mouth: Mucous membranes are moist.  Eyes:     Extraocular Movements: Extraocular movements intact.     Conjunctiva/sclera: Conjunctivae normal.     Pupils: Pupils are equal, round, and reactive to light.  Cardiovascular:     Rate and Rhythm: Normal rate. Rhythm irregular.  Pulmonary:     Effort: Respiratory distress present.     Breath sounds: Examination of the right-upper field reveals wheezing. Examination of the left-upper field reveals wheezing. Wheezing  present.  Abdominal:     Palpations: Abdomen is soft.     Tenderness: There is no abdominal tenderness.  Musculoskeletal:        General: Normal range of motion.     Cervical back: Normal range of motion.     Right lower leg: No edema.     Left lower leg: No edema.  Skin:    General: Skin is warm.     Capillary Refill: Capillary refill takes less than 2 seconds.  Neurological:     General: No focal deficit present.     Mental Status: She is alert.  Psychiatric:        Mood and Affect: Mood normal.        Behavior: Behavior normal.     (all labs ordered are listed, but only abnormal results are displayed) Labs Reviewed  RESP PANEL BY RT-PCR (RSV, FLU A&B, COVID)  RVPGX2 - Abnormal; Notable for the following components:      Result Value   SARS Coronavirus 2 by RT PCR POSITIVE (*)    All other components within normal limits  COMPREHENSIVE METABOLIC PANEL WITH GFR - Abnormal; Notable for the following components:   Sodium 134 (*)    Chloride 95 (*)    Glucose, Bld 125 (*)    Total Bilirubin 1.5 (*)    All other components within normal limits  CBC WITH DIFFERENTIAL/PLATELET - Abnormal; Notable for the following components:   WBC 13.8 (*)    Neutro Abs 10.9 (*)    Monocytes Absolute 1.2 (*)    Abs Immature Granulocytes 0.08 (*)    All other components within normal limits  URINALYSIS, ROUTINE W REFLEX MICROSCOPIC - Abnormal; Notable for the following components:   Specific Gravity, Urine >1.046 (*)    Hgb urine dipstick TRACE (*)    Protein, ur 100 (*)    All other components within normal limits  PRO BRAIN NATRIURETIC PEPTIDE - Abnormal; Notable for the following components:   Pro Brain Natriuretic Peptide 493.0 (*)    All other components within normal limits  LIPASE, BLOOD  PROCALCITONIN  TROPONIN T, HIGH SENSITIVITY  TROPONIN T, HIGH SENSITIVITY    EKG: EKG Interpretation Date/Time:  Tuesday August 08 2024 11:13:43 EDT Ventricular Rate:  105 PR Interval:     QRS Duration:  97 QT Interval:  321 QTC Calculation: 425 R Axis:   64  Text Interpretation: Atrial fibrillation Posterior infarct, old Borderline repolarization abnormality  afib is new Confirmed by Charlyn Sora [phone]) on 08/08/2024 12:08:21 PM  Radiology: CT Angio Chest PE W and/or Wo Contrast Result Date: 08/08/2024 CLINICAL DATA:  Pulmonary embolism (PE) suspected, high prob EXAM: CT ANGIOGRAPHY CHEST WITH CONTRAST TECHNIQUE: Multidetector CT imaging of the chest was performed using the standard protocol during bolus administration of intravenous contrast. Multiplanar CT image reconstructions and MIPs were  obtained to evaluate the vascular anatomy. RADIATION DOSE REDUCTION: This exam was performed according to the departmental dose-optimization program which includes automated exposure control, adjustment of the mA and/or kV according to patient size and/or use of iterative reconstruction technique. CONTRAST:  OMNIPAQUE  IOHEXOL  350 MG/ML SOLN COMPARISON:  03/16/2024 FINDINGS: Pulmonary Embolism: No pulmonary embolism. Cardiovascular: No cardiomegaly or pericardial effusion.Normal variant 2 vessel aortic arch anatomy with the left common carotid arising from the brachiocephalic artery.Similar fusiform dilation of the ascending aorta, measuring 4.3 cm. Diffuse aortic and dense multi-vessel coronary atherosclerosis. Mediastinum/Nodes: No mediastinal mass.Small hiatal hernia.Interval enlargement of the bilateral hilar lymph nodes. For example, there is a 1.2 cm right suprahilar lymph node (axial 65). 1.6 cm left hilar lymph node (axial 73). Lungs/Pleura: The trachea is midline and patent. Diffuse bronchial wall thickening throughout the lungs. Scattered regions of mucous plugging in the right lower lobe bronchi. Obstructive mucous plugging in the left lower lobe bronchus with dense airspace consolidation in the left lower lobe. No pleural effusion or pneumothorax. Musculoskeletal: No acute  fracture or destructive bone lesion. Osteopenia. Thoracic DISH. Multilevel degenerative disc disease of the spine. Upper Abdomen: No acute abnormality in the partially visualized upper abdomen. 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 Cholecystolithiasis. Review of the MIP images confirms the above findings. IMPRESSION: 1. Dense airspace consolidation filling the majority of the left lower lobe with regions of mucous plugging in the bilateral lower lobe bronchi, most likely reflecting pneumonia. No parapneumonic effusion. A 2 view chest radiograph in 6-12 weeks is recommended to document resolution once treatment is complete. 2. Interval enlargement of a couple of bilateral hilar lymph nodes measuring up to 1.6 cm on the left, likely reactive. 3. 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 4. Cholecystolithiasis. Aortic Atherosclerosis (ICD10-I70.0). Electronically Signed   By: Rogelia Myers M.D.   On: 08/08/2024 15:14   DG Chest 2 View Result Date: 08/08/2024 CLINICAL DATA:  Shortness of breath EXAM: CHEST - 2 VIEW COMPARISON:  CT angio Mar 16, 2024. FINDINGS: Enlarged cardiomediastinal silhouette. Aortic knob is calcified. Increased opacity of posterior segment of left lower lobe suggestive of consolidation/atelectasis. Blunting of left costophrenic angle suggestive of scarring/small effusion. Bilateral vascular congestion. No suspicious osseous lesion. IMPRESSION: Posterior segment of left lower lobe consolidation/atelectasis (query pneumonia). Small pleural effusion on the left can not be excluded. Electronically Signed   By: Megan  Zare M.D.   On: 08/08/2024 13:29     Procedures   Medications Ordered in the ED  ipratropium-albuterol  (DUONEB) 0.5-2.5 (3) MG/3ML nebulizer solution 6 mL ( Nebulization Not Given 08/08/24 1116)  iohexol  (OMNIPAQUE ) 350 MG/ML injection 100 mL (100 mLs Intravenous Contrast Given 08/08/24 1433)  cefTRIAXone  (ROCEPHIN ) 1 g in sodium chloride   0.9 % 100 mL IVPB (0 g Intravenous Stopped 08/08/24 1730)  azithromycin  (ZITHROMAX ) 500 mg in sodium chloride  0.9 % 250 mL IVPB (0 mg Intravenous Stopped 08/08/24 1801)   78 y.o. female presents to the ED with complaints of cough, congestion, chills, headache, this involves an extensive number of treatment options, and is a complaint that carries with it a high risk of complications and morbidity.  The differential diagnosis includes COVID, flu, pneumonia, aortic dissection, ACS, COPD exacerbation, CHF exacerbation, pulmonary effusion, (Ddx)  On arrival pt is nontoxic, vitals initially 84% on room air. Exam significant for wheezing.  Additional history obtained from chart review from cardiology significant for patient being in regular rhythm as of 07/13/2024.   I ordered medication DuoNeb for  shortness of breath with wheezing.  Imaging Studies ordered:  I ordered imaging studies which included chest x-ray and CTA, I independently visualized and interpreted imaging which showed chest x-ray noted some possible pneumonia in the lower lobes.  ED Course:   Patient had received 2 DuoNebs prior to initial exam.  Patient was initially satting at 84% in triage.  During initial exam patient had clear lungs auscultation in all fields and was satting at 94%.  Patient reported she had ablation which took care of her A-fib.  Patient has rhythm of A-fib in ED.  Patient also endorses headache.  Patient is notably congested with productive cough in ED.  Abdominal pain does not have any tenderness to palpation.  No obvious lower extremity edema.  Patient denies prior history of MI.  During exam as patient talks O2 sats dropped to the high 80s.  Patient reported some shortness of breath when she went to do her chest x-ray.  Patient does not have any abdominal tenderness and denies chest pain.  Patient is not currently being treated for COPD.  Patient is maintaining low to mid 90s on 3 L of oxygen.  Patient will be  consulted to be admitted for symptomatic A-fib with acute respiratory failure.  It was discussed with Dr. Gonfa and advised to do CTA to rule out PE.  CTA was negative for PE but evidence of pneumonia.  It was discussed with admitting doctor to start on antibiotics and order procalcitonin.  At shift change patient was still awaiting transport to [facility].   Portions of this note were generated with Scientist, clinical (histocompatibility and immunogenetics). Dictation errors may occur despite best attempts at proofreading.    Final diagnoses:  Atrial fibrillation, unspecified type [facility])  Acute respiratory distress    ED Discharge Orders     None          Myriam Fonda GORMAN DEVONNA 08/08/24 1906    Charlyn Sora, MD 08/12/24 [phone]

## 2024-08-08 NOTE — Plan of Care (Signed)
 78 year old F with PMH of COPD, A-fib/flutter not on AC, OSA, choledocholithiasis, partial gastrectomy, HTN, HLD, DM-2 and osteoarthritis sent to [facility] with cough, congestion, shortness of breath and COVID infection  In ED, initially slightly tachycardic with mild tachypnea but normalized.  Desaturated to 84% on RA and started on 3 L.  Currently in mid 90s.  Initial troponin negative.  BNP 493.  WBC 13.8.  Otherwise, CMP and CBC without significant finding.  CXR with posterior LLL consolidation and small right effusion.  Admission requested for A-fib/flutter and new oxygen requirement.  Patient received DuoNeb.  Discussed CT angio chest, and EDPA to order.  Advised to let us  know if significant finding on CT angio chest before the patient moves.  Meanwhile, accepted to medical telemetry bed.

## 2024-08-09 DIAGNOSIS — I4891 Unspecified atrial fibrillation: Secondary | ICD-10-CM | POA: Diagnosis not present

## 2024-08-09 DIAGNOSIS — E7800 Pure hypercholesterolemia, unspecified: Secondary | ICD-10-CM | POA: Diagnosis not present

## 2024-08-09 DIAGNOSIS — Z87891 Personal history of nicotine dependence: Secondary | ICD-10-CM | POA: Diagnosis not present

## 2024-08-09 DIAGNOSIS — I959 Hypotension, unspecified: Secondary | ICD-10-CM | POA: Diagnosis not present

## 2024-08-09 DIAGNOSIS — E119 Type 2 diabetes mellitus without complications: Secondary | ICD-10-CM | POA: Diagnosis not present

## 2024-08-09 DIAGNOSIS — J9601 Acute respiratory failure with hypoxia: Secondary | ICD-10-CM | POA: Diagnosis not present

## 2024-08-09 DIAGNOSIS — Z6841 Body Mass Index (BMI) 40.0 and over, adult: Secondary | ICD-10-CM | POA: Diagnosis not present

## 2024-08-09 DIAGNOSIS — Z833 Family history of diabetes mellitus: Secondary | ICD-10-CM | POA: Diagnosis not present

## 2024-08-09 DIAGNOSIS — E66813 Obesity, class 3: Secondary | ICD-10-CM | POA: Diagnosis not present

## 2024-08-09 DIAGNOSIS — Z79899 Other long term (current) drug therapy: Secondary | ICD-10-CM | POA: Diagnosis not present

## 2024-08-09 DIAGNOSIS — I10 Essential (primary) hypertension: Secondary | ICD-10-CM | POA: Diagnosis not present

## 2024-08-09 DIAGNOSIS — U071 COVID-19: Secondary | ICD-10-CM | POA: Diagnosis not present

## 2024-08-09 DIAGNOSIS — F32A Depression, unspecified: Secondary | ICD-10-CM | POA: Diagnosis not present

## 2024-08-09 DIAGNOSIS — Z634 Disappearance and death of family member: Secondary | ICD-10-CM | POA: Diagnosis not present

## 2024-08-09 DIAGNOSIS — Z96641 Presence of right artificial hip joint: Secondary | ICD-10-CM | POA: Diagnosis not present

## 2024-08-09 DIAGNOSIS — Z23 Encounter for immunization: Secondary | ICD-10-CM | POA: Diagnosis not present

## 2024-08-09 DIAGNOSIS — Z9884 Bariatric surgery status: Secondary | ICD-10-CM | POA: Diagnosis not present

## 2024-08-09 DIAGNOSIS — D509 Iron deficiency anemia, unspecified: Secondary | ICD-10-CM | POA: Diagnosis not present

## 2024-08-09 DIAGNOSIS — I2510 Atherosclerotic heart disease of native coronary artery without angina pectoris: Secondary | ICD-10-CM | POA: Diagnosis not present

## 2024-08-09 DIAGNOSIS — J1282 Pneumonia due to coronavirus disease 2019: Secondary | ICD-10-CM | POA: Diagnosis not present

## 2024-08-09 DIAGNOSIS — G4733 Obstructive sleep apnea (adult) (pediatric): Secondary | ICD-10-CM | POA: Diagnosis not present

## 2024-08-09 DIAGNOSIS — J440 Chronic obstructive pulmonary disease with acute lower respiratory infection: Secondary | ICD-10-CM | POA: Diagnosis not present

## 2024-08-09 DIAGNOSIS — Z96651 Presence of right artificial knee joint: Secondary | ICD-10-CM | POA: Diagnosis not present

## 2024-08-09 DIAGNOSIS — J159 Unspecified bacterial pneumonia: Secondary | ICD-10-CM | POA: Diagnosis not present

## 2024-08-09 LAB — CBC
HCT: 38.1 % (ref 36.0–46.0)
Hemoglobin: 12.3 g/dL (ref 12.0–15.0)
MCH: 26.3 pg (ref 26.0–34.0)
MCHC: 32.3 g/dL (ref 30.0–36.0)
MCV: 81.6 fL (ref 80.0–100.0)
Platelets: 264 K/uL (ref 150–400)
RBC: 4.67 MIL/uL (ref 3.87–5.11)
RDW: 14.4 % (ref 11.5–15.5)
WBC: 13.1 K/uL — ABNORMAL HIGH (ref 4.0–10.5)
nRBC: 0 % (ref 0.0–0.2)

## 2024-08-09 LAB — MAGNESIUM: Magnesium: 1.8 mg/dL (ref 1.7–2.4)

## 2024-08-09 LAB — BASIC METABOLIC PANEL WITH GFR
Anion gap: 12 (ref 5–15)
BUN: 11 mg/dL (ref 8–23)
CO2: 23 mmol/L (ref 22–32)
Calcium: 9.1 mg/dL (ref 8.9–10.3)
Chloride: 99 mmol/L (ref 98–111)
Creatinine, Ser: 0.7 mg/dL (ref 0.44–1.00)
GFR, Estimated: >60 mL/min (ref >60)
Glucose, Bld: 138 mg/dL — ABNORMAL HIGH (ref 70–99)
Potassium: 3.8 mmol/L (ref 3.5–5.1)
Sodium: 134 mmol/L — ABNORMAL LOW (ref 135–145)

## 2024-08-09 LAB — C-REACTIVE PROTEIN: CRP: 28.7 mg/dL — ABNORMAL HIGH (ref <1.0)

## 2024-08-09 LAB — SEDIMENTATION RATE: Sed Rate: 71 mm/h — ABNORMAL HIGH (ref 0–22)

## 2024-08-09 LAB — PHOSPHORUS: Phosphorus: 3.5 mg/dL (ref 2.5–4.6)

## 2024-08-09 MED ORDER — METHYLPREDNISOLONE SODIUM SUCC 40 MG IJ SOLR
40.0000 mg | INTRAMUSCULAR | Status: AC
  Administered 2024-08-09: 40 mg via INTRAVENOUS
  Filled 2024-08-09: qty 1

## 2024-08-09 MED ORDER — METHYLPREDNISOLONE SODIUM SUCC 40 MG IJ SOLR
40.0000 mg | INTRAMUSCULAR | Status: DC
  Administered 2024-08-09 – 2024-08-13 (×5): 40 mg via INTRAVENOUS
  Filled 2024-08-09 (×5): qty 1

## 2024-08-09 MED ORDER — POLYVINYL ALCOHOL 1.4 % OP SOLN
1.0000 [drp] | OPHTHALMIC | Status: DC | PRN
  Administered 2024-08-09 – 2024-08-10 (×2): 1 [drp] via OPHTHALMIC
  Filled 2024-08-09: qty 15

## 2024-08-09 MED ORDER — IPRATROPIUM-ALBUTEROL 0.5-2.5 (3) MG/3ML IN SOLN
3.0000 mL | RESPIRATORY_TRACT | Status: AC
  Administered 2024-08-09: 3 mL via RESPIRATORY_TRACT
  Filled 2024-08-09: qty 3

## 2024-08-09 MED ORDER — POLYETHYL GLYCOL-PROPYL GLYCOL 0.4-0.3 % OP GEL
OPHTHALMIC | Status: DC | PRN
Start: 2024-08-09 — End: 2024-08-09
  Filled 2024-08-09 (×3): qty 10

## 2024-08-09 NOTE — Progress Notes (Signed)
   08/09/24 1936  BiPAP/CPAP/SIPAP  Reason BIPAP/CPAP not in use Other(comment) (Patient states coughing up phlem does not want to wear)  BiPAP/CPAP /SiPAP Vitals  Pulse Rate 91  Resp 16  SpO2 93 %  Bilateral Breath Sounds Clear;Diminished  MEWS Score/Color  MEWS Score 0  MEWS Score Color Green

## 2024-08-09 NOTE — Plan of Care (Signed)
 Patient admitted to [facility], with productive cough. New orders placed for cough medication. Patient left in chair as requested on nasal canula.   Problem: Education: Goal: Knowledge of General Education information will improve Description: Including pain rating scale, medication(s)/side effects and non-pharmacologic comfort measures Outcome: Progressing   Problem: Health Behavior/Discharge Planning: Goal: Ability to manage health-related needs will improve Outcome: Progressing   Problem: Clinical Measurements: Goal: Ability to maintain clinical measurements within normal limits will improve Outcome: Progressing Goal: Will remain free from infection Outcome: Progressing   Problem: Activity: Goal: Risk for activity intolerance will decrease Outcome: Progressing   Problem: Nutrition: Goal: Adequate nutrition will be maintained Outcome: Progressing   Problem: Coping: Goal: Level of anxiety will decrease Outcome: Progressing   Problem: Safety: Goal: Ability to remain free from injury will improve Outcome: Progressing

## 2024-08-09 NOTE — Progress Notes (Signed)
 PROGRESS NOTE  Veronica Hansen FMW:[id number] DOB: 04-20-1946 DOA: 08/08/2024 PCP: Yolande Toribio [name], MD   LOS: 0 days   Brief narrative:  Veronica Hansen is a 78 y.o. female with past medical history of atrial flutter status post ablation, obesity status post stapling, obstructive sleep apnea on CPAP, aortic aneurysm presented to hospital with shortness of breath cough.  Of note patient was recently diagnosed of COVID infection on 08/04/2024 and received 5-day course of Paxlovid.  Patient however continued to have shortness of breath even with minimal exertion and presented to the hospital.  She was noted to be hypoxic with pulse ox in the mid 80s on room air.  Labs showed mild leukocytosis at 13.8.  proBNP 93.  Patient tested positive for COVID by PCR.  Procalcitonin was less than 0.10.  CT angiogram of the chest was negative for PE but left-sided infiltrate suggestive pneumonia.  Patient was then considered for admission to hospital for further evaluation and treatment.     Assessment/Plan: Principal Problem:   Acute respiratory failure with hypoxia (HCC)  Acute hypoxic respiratory failure secondary to bacterial infection superimposed on COVID-19 pneumonia.   Initial  pulse ox in the 80s.  Continue incentive spirometry, mobilization, bronchodilator, Solu-Medrol , CRP elevated at 28.7 with ESR of 54.  Currently on Rocephin  and Zithromax .  Currently on 4 L of oxygen by nasal cannula.  Will continue to wean oxygen as able.   COVID-19 viral infection Initially diagnosed 08/04/2024.  Plan to continue isolation until 08/14/2024. Supportive care including iV steroids.  Patient on supplemental oxygen  Class III obesity Body mass index is 43.26 kg/m.  Patient would benefit from ongoing weight loss as outpatient.  Leukocytosis WBC at 13.1.  Continue to monitor.  On antibiotic treatment for pneumonia.  Borderline hypotension.  Continue to monitor.  Latest blood pressure of  100/55  Deconditioning debility.  Will get PT OT evaluation.  Obstructive sleep apnea.  Continue CPAP at nighttime.   DVT prophylaxis: Lovenox  subcu   Disposition: Likely home with home health likely in 1 to 2 days  Status is: Inpatient Remains inpatient appropriate because: Pending clinical improvement    Code Status:     Code Status: Full Code  Family Communication: None at bedside  Consultants: None   Procedures: None  Anti-infectives:  Rocephin  and Zithromax   Anti-infectives (From admission, onward)    Start     Dose/Rate Route Frequency Ordered Stop   08/09/24 1800  azithromycin  (ZITHROMAX ) 500 mg in sodium chloride  0.9 % 250 mL IVPB        500 mg 250 mL/hr over 60 Minutes Intravenous Every 24 hours 08/08/24 2358     08/09/24 1700  cefTRIAXone  (ROCEPHIN ) 2 g in sodium chloride  0.9 % 100 mL IVPB        2 g 200 mL/hr over 30 Minutes Intravenous Every 24 hours 08/08/24 2358     08/08/24 1645  cefTRIAXone  (ROCEPHIN ) 1 g in sodium chloride  0.9 % 100 mL IVPB        1 g 200 mL/hr over 30 Minutes Intravenous  Once 08/08/24 1638 08/08/24 1730   08/08/24 1645  azithromycin  (ZITHROMAX ) 500 mg in sodium chloride  0.9 % 250 mL IVPB        500 mg 250 mL/hr over 60 Minutes Intravenous  Once 08/08/24 1638 08/08/24 1801        Subjective: Today, patient was seen and examined at bedside.  Patient complains of discomfort in the throat, headache, cough congestion and  shortness of breath  Objective: Vitals:   08/09/24 0900 08/09/24 1226  BP:  (!) 100/55  Pulse: 90 86  Resp: 18 16  Temp:  97.6 F (36.4 C)  SpO2:  93%    Intake/Output Summary (Last 24 hours) at 08/09/2024 1321 Last data filed at 08/08/2024 1823 Gross per 24 hour  Intake 351.4 ml  Output --  Net 351.4 ml   Filed Weights   08/08/24 2045  Weight: 121.6 kg   Body mass index is 43.26 kg/m.   Physical Exam:  GENERAL: Patient is alert awake and oriented. Not in obvious distress.  Obese.  On nasal  cannula oxygen at 4 L/min, elderly female HENT: No scleral pallor or icterus. Pupils equally reactive to light. Oral mucosa is moist NECK: is supple, no gross swelling noted. CHEST: Coarse breath sounds noted bilaterally, decreased breath sounds CVS: S1 and S2 heard, no murmur. Regular rate and rhythm.  ABDOMEN: Soft, non-tender, bowel sounds are present. EXTREMITIES: No edema. CNS: Cranial nerves are intact. No focal motor deficits. SKIN: warm and dry without rashes.  Data Review: I have personally reviewed the following laboratory data and studies,  CBC: Recent Labs  Lab 08/08/24 1217 08/09/24 0514  WBC 13.8* 13.1*  NEUTROABS 10.9*  --   HGB 12.3 12.3  HCT 38.9 38.1  MCV 82.8 81.6  PLT 258 264   Basic Metabolic Panel: Recent Labs  Lab 08/08/24 1217 08/09/24 0514  NA 134* 134*  K 3.7 3.8  CL 95* 99  CO2 25 23  GLUCOSE 125* 138*  BUN 11 11  CREATININE 0.62 0.70  CALCIUM 9.8 9.1  MG  --  1.8  PHOS  --  3.5   Liver Function Tests: Recent Labs  Lab 08/08/24 1217  AST 21  ALT 10  ALKPHOS 105  BILITOT 1.5*  PROT 7.9  ALBUMIN 4.2   Recent Labs  Lab 08/08/24 1217  LIPASE 26   No results for input(s): AMMONIA in the last 168 hours. Cardiac Enzymes: No results for input(s): CKTOTAL, CKMB, CKMBINDEX, TROPONINI in the last 168 hours. BNP (last 3 results) No results for input(s): BNP in the last 8760 hours.  ProBNP (last 3 results) Recent Labs    08/08/24 1217  PROBNP 493.0*    CBG: No results for input(s): GLUCAP in the last 168 hours. Recent Results (from the past 240 hours)  Resp panel by RT-PCR (RSV, Flu A&B, Covid) Anterior Nasal Swab     Status: Abnormal   Collection Time: 08/08/24 12:17 PM   Specimen: Anterior Nasal Swab  Result Value Ref Range Status   SARS Coronavirus 2 by RT PCR POSITIVE (A) NEGATIVE Final    Comment: (NOTE) SARS-CoV-2 target nucleic acids are DETECTED.  The SARS-CoV-2 RNA is generally detectable in upper  respiratory specimens during the acute phase of infection. Positive results are indicative of the presence of the identified virus, but do not rule out bacterial infection or co-infection with other pathogens not detected by the test. Clinical correlation with patient history and other diagnostic information is necessary to determine patient infection status. The expected result is Negative.  Fact Sheet for Patients: BloggerCourse.com  Fact Sheet for Healthcare Providers: SeriousBroker.it  This test is not yet approved or cleared by the [place]  FDA and  has been authorized for detection and/or diagnosis of SARS-CoV-2 by FDA under an Emergency Use Authorization (EUA).  This EUA will remain in effect (meaning this test can be used) for the duration of  the  COVID-19 declaration under Section 564(b)(1) of the A ct, 21 U.S.C. section 360bbb-3(b)(1), unless the authorization is terminated or revoked sooner.     Influenza A by PCR NEGATIVE NEGATIVE Final   Influenza B by PCR NEGATIVE NEGATIVE Final    Comment: (NOTE) The Xpert Xpress SARS-CoV-2/FLU/RSV plus assay is intended as an aid in the diagnosis of influenza from Nasopharyngeal swab specimens and should not be used as a sole basis for treatment. Nasal washings and aspirates are unacceptable for Xpert Xpress SARS-CoV-2/FLU/RSV testing.  Fact Sheet for Patients: BloggerCourse.com  Fact Sheet for Healthcare Providers: SeriousBroker.it  This test is not yet approved or cleared by the [place]  FDA and has been authorized for detection and/or diagnosis of SARS-CoV-2 by FDA under an Emergency Use Authorization (EUA). This EUA will remain in effect (meaning this test can be used) for the duration of the COVID-19 declaration under Section 564(b)(1) of the Act, 21 U.S.C. section 360bbb-3(b)(1), unless the authorization is  terminated or revoked.     Resp Syncytial Virus by PCR NEGATIVE NEGATIVE Final    Comment: (NOTE) Fact Sheet for Patients: BloggerCourse.com  Fact Sheet for Healthcare Providers: SeriousBroker.it  This test is not yet approved or cleared by the [place]  FDA and has been authorized for detection and/or diagnosis of SARS-CoV-2 by FDA under an Emergency Use Authorization (EUA). This EUA will remain in effect (meaning this test can be used) for the duration of the COVID-19 declaration under Section 564(b)(1) of the Act, 21 U.S.C. section 360bbb-3(b)(1), unless the authorization is terminated or revoked.  Performed at [facility], [address] [place], [place] [address]      Studies: CT Angio Chest PE W and/or Wo Contrast Result Date: 08/08/2024 CLINICAL DATA:  Pulmonary embolism (PE) suspected, high prob EXAM: CT ANGIOGRAPHY CHEST WITH CONTRAST TECHNIQUE: Multidetector CT imaging of the chest was performed using the standard protocol during bolus administration of intravenous contrast. Multiplanar CT image reconstructions and MIPs were obtained to evaluate the vascular anatomy. RADIATION DOSE REDUCTION: This exam was performed according to the departmental dose-optimization program which includes automated exposure control, adjustment of the mA and/or kV according to patient size and/or use of iterative reconstruction technique. CONTRAST:  OMNIPAQUE  IOHEXOL  350 MG/ML SOLN COMPARISON:  03/16/2024 FINDINGS: Pulmonary Embolism: No pulmonary embolism. Cardiovascular: No cardiomegaly or pericardial effusion.Normal variant 2 vessel aortic arch anatomy with the left common carotid arising from the brachiocephalic artery.Similar fusiform dilation of the ascending aorta, measuring 4.3 cm. Diffuse aortic and dense multi-vessel coronary atherosclerosis. Mediastinum/Nodes: No mediastinal mass.Small hiatal hernia.Interval  enlargement of the bilateral hilar lymph nodes. For example, there is a 1.2 cm right suprahilar lymph node (axial 65). 1.6 cm left hilar lymph node (axial 73). Lungs/Pleura: The trachea is midline and patent. Diffuse bronchial wall thickening throughout the lungs. Scattered regions of mucous plugging in the right lower lobe bronchi. Obstructive mucous plugging in the left lower lobe bronchus with dense airspace consolidation in the left lower lobe. No pleural effusion or pneumothorax. Musculoskeletal: No acute fracture or destructive bone lesion. Osteopenia. Thoracic DISH. Multilevel degenerative disc disease of the spine. Upper Abdomen: No acute abnormality in the partially visualized upper abdomen. 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 Cholecystolithiasis. Review of the MIP images confirms the above findings. IMPRESSION: 1. Dense airspace consolidation filling the majority of the left lower lobe with regions of mucous plugging in the bilateral lower lobe bronchi, most likely reflecting pneumonia. No parapneumonic effusion. A 2 view chest radiograph in 6-12 weeks  is recommended to document resolution once treatment is complete. 2. Interval enlargement of a couple of bilateral hilar lymph nodes measuring up to 1.6 cm on the left, likely reactive. 3. 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 4. Cholecystolithiasis. Aortic Atherosclerosis (ICD10-I70.0). Electronically Signed   By: Rogelia Myers M.D.   On: 08/08/2024 15:14   DG Chest 2 View Result Date: 08/08/2024 CLINICAL DATA:  Shortness of breath EXAM: CHEST - 2 VIEW COMPARISON:  CT angio Mar 16, 2024. FINDINGS: Enlarged cardiomediastinal silhouette. Aortic knob is calcified. Increased opacity of posterior segment of left lower lobe suggestive of consolidation/atelectasis. Blunting of left costophrenic angle suggestive of scarring/small effusion. Bilateral vascular congestion. No suspicious osseous lesion. IMPRESSION:  Posterior segment of left lower lobe consolidation/atelectasis (query pneumonia). Small pleural effusion on the left can not be excluded. Electronically Signed   By: Megan  Zare M.D.   On: 08/08/2024 13:29      Vernal Alstrom, MD  [facility] 08/09/2024  If 7PM-7AM, please contact night-coverage

## 2024-08-09 NOTE — Hospital Course (Addendum)
 Veronica Hansen is a 78 y.o. female with past medical history of atrial flutter status post ablation, obesity status post stapling, obstructive sleep apnea on CPAP, aortic aneurysm presented to hospital with shortness of breath cough.  Of note patient was recently diagnosed of COVID infection on 08/04/2024 and received 5-day course of Paxlovid.  Patient however continued to have shortness of breath even with minimal exertion and presented to the hospital.  She was noted to be hypoxic with pulse ox in the mid 80s on room air.  Labs showed mild leukocytosis at 13.8.  proBNP 93.  Patient tested positive for COVID by PCR.  Procalcitonin was less than 0.10.  CT angiogram of the chest was negative for PE but left-sided infiltrate suggestive pneumonia.  Patient was then considered for admission to hospital for further evaluation and treatment.    Assessment/Plan  Acute hypoxic respiratory failure secondary to bacterial infection superimposed on COVID-19 pneumonia.   Initial pulse ox in the 80s. CRP elevated at 28.7 with ESR of 54. - Successfully weaned patient down to 4 L HFNC. Patient showing significant clinical improvement and resolution of wheezing. - Goal to maintain SpO2 between 88-92%. Wean as tolerated - Will complete 5-day course of azithromycin  today - Continue Rocephin  - Continue Solu-Medrol  40 mg daily for 1 more daily - Continue Budesonide  nebs BID - Continuous pulse ox - Sputum culture prelim negative for Staph aureus or Pseudomonas, pending final cultures   COVID-19 viral infection Initially diagnosed 08/04/2024.  Plan to continue isolation until 08/14/2024. Supportive care including steroids.    Leukocytosis Worsening WBC to 19.7. However, patient is afebrile and overall significantly improved clinically. Therefore, more likely secondary to steroid use versus secondary to underlying bacterial pneumonia Continue to monitor.  On antibiotic treatment for pneumonia.  Borderline  hypotension - resolved Primary hypertension - BP elevated to 140s - Home regimen includes amlodipine  5 mg and olmesartan 40 mg - Resumed home amlodipine    COPD Former smoking history - Not on home O2 or inhalers - Smoking 2 ppd for 20 years, stopped 25 years ago  Deconditioning debility. PT OT following for evaluation  Obstructive sleep apnea.   - Continue CPAP at nighttime  Class III obesity Body mass index is 43.26 kg/m.  Patient would benefit from ongoing weight loss as outpatient.

## 2024-08-09 NOTE — Evaluation (Signed)
 Occupational Therapy Evaluation Patient Details Name: Veronica Hansen MRN: [id number] DOB: 1946/08/19 Today's Date: 08/09/2024   History of Present Illness   Patient presenting to the ED with COVID and SOB for the last 5 days on 08/08/24. Admitted with the same diagnosis. Noted to have A-fib and potential pneumonia. PMH includes:COPD, A-fib/flutter not on AC, OSA, choledocholithiasis, partial gastrectomy, HTN, HLD, DM-2 and osteoarthritis     Clinical Impressions Prior to this admission, patient living alone, and fully independent. Patient very active at baseline, and enjoys planning parties, and going on bus trips with her friends. Currently, patient requiring 4L O2 to maintain saturations above 90% and presenting with generalized weakness, decreased activity tolerance, and minimal edema in her BLEs. Patient CGA for functional mobility and ADLs. OT will continue to follow acutely, but anticipate no OT follow up at discharge.      If plan is discharge home, recommend the following:   A little help with walking and/or transfers;A little help with bathing/dressing/bathroom;Assistance with cooking/housework;Assist for transportation;Help with stairs or ramp for entrance (initially)     Functional Status Assessment   Patient has had a recent decline in their functional status and demonstrates the ability to make significant improvements in function in a reasonable and predictable amount of time.     Equipment Recommendations   None recommended by OT     Recommendations for Other Services         Precautions/Restrictions   Precautions Precautions: Fall;Other (comment) Recall of Precautions/Restrictions: Intact Precaution/Restrictions Comments: watch o2 Restrictions Weight Bearing Restrictions Per Provider Order: No     Mobility Bed Mobility               General bed mobility comments: up in recliner upon arrival    Transfers Overall transfer level: Needs  assistance   Transfers: Sit to/from Stand Sit to Stand: Contact guard assist           General transfer comment: CGA for line management      Balance Overall balance assessment: Mild deficits observed, not formally tested                                         ADL either performed or assessed with clinical judgement   ADL Overall ADL's : Needs assistance/impaired Eating/Feeding: Set up;Sitting   Grooming: Set up;Sitting   Upper Body Bathing: Set up;Sitting   Lower Body Bathing: Contact guard assist;Sitting/lateral leans;Sit to/from stand   Upper Body Dressing : Set up;Sitting   Lower Body Dressing: Contact guard assist;Sit to/from stand;Sitting/lateral leans   Toilet Transfer: Contact guard assist;Ambulation;Regular Toilet   Toileting- Clothing Manipulation and Hygiene: Set up;Sitting/lateral lean;Sit to/from stand       Functional mobility during ADLs: Contact guard assist General ADL Comments: Prior to this admission, patient living alone, and fully independent. Patient very active at baseline, and enjoys planning parties, and going on bus trips with her friends. Currently, patient requiring 4L O2 to maintain saturations above 90% and presenting with generalized weakness and decreased activity tolerance. Patient CGA for functional mobility and ADLs. OT will continue to follow acutely, but anticipate no OT follow up at discharge.     Vision Baseline Vision/History: 1 Wears glasses Ability to See in Adequate Light: 0 Adequate Patient Visual Report: No change from baseline Vision Assessment?: Wears glasses for reading     Perception Perception: Not tested  Praxis Praxis: Not tested       Pertinent Vitals/Pain Pain Assessment Pain Assessment: No/denies pain     Extremity/Trunk Assessment Upper Extremity Assessment Upper Extremity Assessment: Right hand dominant;Overall [facility] for tasks assessed   Lower Extremity Assessment Lower  Extremity Assessment: Defer to PT evaluation;Overall [facility] for tasks assessed   Cervical / Trunk Assessment Cervical / Trunk Assessment: Normal   Communication Communication Communication: No apparent difficulties   Cognition Arousal: Alert Behavior During Therapy: WFL for tasks assessed/performed Cognition: No apparent impairments                               Following commands: Intact       Cueing  General Comments   Cueing Techniques: Verbal cues  VSS on 4L O2   Exercises     Shoulder Instructions      Home Living Family/patient expects to be discharged to:: Private residence Living Arrangements: Alone Available Help at Discharge: Family;Neighbor;Available PRN/intermittently Type of Home: House Home Access: Stairs to enter [employer] of Steps: 4 Entrance Stairs-Rails: Can reach both Home Layout: Two level;Full bath on main level;Able to live on main level with bedroom/bathroom Alternate Level Stairs-Number of Steps: does not go upstairs   Bathroom Shower/Tub: Producer, television/film/video: Standard     Home Equipment: Agricultural consultant (2 wheels);Rollator (4 wheels);BSC/3in1;Grab bars - tub/shower;Shower seat - built in          Prior Functioning/Environment Prior Level of Function : Driving;Independent/Modified Independent             Mobility Comments: independent ADLs Comments: independent and very active at baseline    OT Problem List: Decreased activity tolerance;Increased edema   OT Treatment/Interventions: Self-care/ADL training;Therapeutic exercise;Energy conservation;DME and/or AE instruction;Manual therapy;Therapeutic activities;Patient/family education;Balance training      OT Goals(Current goals can be found in the care plan section)   Acute Rehab OT Goals Patient Stated Goal: to get better OT Goal Formulation: With patient Time For Goal Achievement: 08/23/24 Potential to Achieve Goals: Good ADL Goals Pt  Will Perform Lower Body Bathing: with modified independence;sitting/lateral leans;sit to/from stand Pt Will Perform Lower Body Dressing: with modified independence;sit to/from stand;sitting/lateral leans Pt Will Transfer to Toilet: with modified independence;ambulating;regular height toilet Pt Will Perform Toileting - Clothing Manipulation and hygiene: with modified independence;sitting/lateral leans;sit to/from stand Additional ADL Goal #1: Patient will be able to complete functional task in standing for 3-5 minutes without needing seated rest break in order to increase overall activity tolerance. Additional ADL Goal #2: Patient will be able to name 3 strategies for energy conservation for safe discharge home.   OT Frequency:  Min 2X/week    Co-evaluation              AM-PAC OT 6 Clicks Daily Activity     Outcome Measure Help from another person eating meals?: A Little Help from another person taking care of personal grooming?: A Little Help from another person toileting, which includes using toliet, bedpan, or urinal?: A Little Help from another person bathing (including washing, rinsing, drying)?: A Little Help from another person to put on and taking off regular upper body clothing?: A Little Help from another person to put on and taking off regular lower body clothing?: A Little 6 Click Score: 18   End of Session Nurse Communication: Mobility status  Activity Tolerance: Patient tolerated treatment well Patient left: in chair;with call bell/phone within reach  OT  Visit Diagnosis: Muscle weakness (generalized) (M62.81);Other abnormalities of gait and mobility (R26.89)                Time: 0939-1000 OT Time Calculation (min): 21 min Charges:  OT General Charges $OT Visit: 1 Visit OT Evaluation $OT Eval Moderate Complexity: 1 Mod  Ronal Gift E. Magalene Mclear, OTR/L Acute Rehabilitation Services [phone]   Ronal Gift Salt 08/09/2024, 10:24 AM

## 2024-08-09 NOTE — Evaluation (Signed)
 Physical Therapy Evaluation Patient Details Name: Veronica Hansen MRN: [id number] DOB: February 08, 1946 Today's Date: 08/09/2024  History of Present Illness  Patient presenting to the ED with COVID and SOB for the last 5 days on 08/08/24. Admitted with the same diagnosis. Noted to have A-fib and potential pneumonia. PMH includes:COPD, A-fib/flutter not on AC, OSA, choledocholithiasis, partial gastrectomy, HTN, HLD, DM-2 and osteoarthritis  Clinical Impression  Patient presents with decreased mobility due to decreased activity tolerance with hypoxia noted at rest on RA.  She normally lives alone and completes ADL/IADL though currently needing support for safety with dropping SpO2.  She will benefit from skilled PT in the acute setting for safe d/c home with intermittent family support.  May benefit from HHPT follow up.         If plan is discharge home, recommend the following: Help with stairs or ramp for entrance   Can travel by private vehicle        Equipment Recommendations None recommended by PT  Recommendations for Other Services       Functional Status Assessment Patient has had a recent decline in their functional status and demonstrates the ability to make significant improvements in function in a reasonable and predictable amount of time.     Precautions / Restrictions Precautions Precautions: Fall Recall of Precautions/Restrictions: Intact Precaution/Restrictions Comments: watch o2      Mobility  Bed Mobility               General bed mobility comments: up in recliner upon arrival    Transfers Overall transfer level: Needs assistance   Transfers: Sit to/from Stand Sit to Stand: Supervision           General transfer comment: A for set up with O2 and lines for safety    Ambulation/Gait Ambulation/Gait assistance: Supervision Gait Distance (Feet): 180 Feet Assistive device: Rolling walker (2 wheels) Gait Pattern/deviations: Step-through pattern,  Decreased stride length       General Gait Details: using RW in hallway with O2 on initially SpO2 95%, on RA second half of walker SpO2 91%  Stairs            Wheelchair Mobility     Tilt Bed    Modified Rankin (Stroke Patients Only)       Balance Overall balance assessment: Needs assistance Sitting-balance support: Feet supported Sitting balance-Leahy Scale: Normal Sitting balance - Comments: put on socks in sitting   Standing balance support: No upper extremity supported Standing balance-Leahy Scale: Fair Standing balance comment: initial stand no UE support                             Pertinent Vitals/Pain Pain Assessment Pain Assessment: No/denies pain    Home Living Family/patient expects to be discharged to:: Private residence Living Arrangements: Alone Available Help at Discharge: Family;Neighbor;Available PRN/intermittently Type of Home: House Home Access: Stairs to enter Entrance Stairs-Rails: Can reach both Entrance Stairs-Number of Steps: 4 Alternate Level Stairs-Number of Steps: does not go upstairs Home Layout: Two level;Full bath on main level;Able to live on main level with bedroom/bathroom Home Equipment: Rolling Walker (2 wheels);Rollator (4 wheels);BSC/3in1;Grab bars - tub/shower;Shower seat - built in      Prior Function Prior Level of Function : Driving;Independent/Modified Independent             Mobility Comments: independent ADLs Comments: independent and very active at baseline     Extremity/Trunk Assessment   Upper  Extremity Assessment Upper Extremity Assessment: Defer to OT evaluation    Lower Extremity Assessment Lower Extremity Assessment: Overall WFL for tasks assessed    Cervical / Trunk Assessment Cervical / Trunk Assessment: Kyphotic  Communication   Communication Communication: No apparent difficulties    Cognition Arousal: Alert Behavior During Therapy: WFL for tasks assessed/performed   PT  - Cognitive impairments: No apparent impairments                         Following commands: Intact       Cueing Cueing Techniques: Verbal cues     General Comments General comments (skin integrity, edema, etc.): on 3L O2 at rest SpO2 86%, up to 4L SpO2 94%, with ambulation on 4L 95%, on RA 91%; then at rest on RA SpO2 84%, on 3L 92%    Exercises     Assessment/Plan    PT Assessment Patient needs continued PT services  PT Problem List Cardiopulmonary status limiting activity;Decreased activity tolerance       PT Treatment Interventions DME instruction;Gait training;Stair training;Patient/family education;Functional mobility training;Therapeutic activities;Therapeutic exercise    PT Goals (Current goals can be found in the Care Plan section)  Acute Rehab PT Goals Patient Stated Goal: return to independent PT Goal Formulation: With patient Time For Goal Achievement: 08/23/24 Potential to Achieve Goals: Good    Frequency Min 2X/week     Co-evaluation               AM-PAC PT 6 Clicks Mobility  Outcome Measure Help needed turning from your back to your side while in a flat bed without using bedrails?: None Help needed moving from lying on your back to sitting on the side of a flat bed without using bedrails?: None Help needed moving to and from a bed to a chair (including a wheelchair)?: None Help needed standing up from a chair using your arms (e.g., wheelchair or bedside chair)?: None Help needed to walk in hospital room?: A Little Help needed climbing 3-5 steps with a railing? : A Little 6 Click Score: 22    End of Session Equipment Utilized During Treatment: Gait belt;Oxygen Activity Tolerance: Patient tolerated treatment well Patient left: in chair;with call bell/phone within reach   PT Visit Diagnosis: Difficulty in walking, not elsewhere classified (R26.2)    Time: 8784-8760 PT Time Calculation (min) (ACUTE ONLY): 24 min   Charges:   PT  Evaluation $PT Eval Low Complexity: 1 Low PT Treatments $Gait Training: 8-22 mins PT General Charges $$ ACUTE PT VISIT: 1 Visit         Micheline Portal, PT Acute Rehabilitation Services Office:[phone] 08/09/2024   Montie Portal 08/09/2024, 2:47 PM

## 2024-08-10 DIAGNOSIS — J9601 Acute respiratory failure with hypoxia: Secondary | ICD-10-CM | POA: Diagnosis not present

## 2024-08-10 LAB — BASIC METABOLIC PANEL WITH GFR
Anion gap: 15 (ref 5–15)
BUN: 20 mg/dL (ref 8–23)
CO2: 23 mmol/L (ref 22–32)
Calcium: 10 mg/dL (ref 8.9–10.3)
Chloride: 97 mmol/L — ABNORMAL LOW (ref 98–111)
Creatinine, Ser: 0.69 mg/dL (ref 0.44–1.00)
GFR, Estimated: >60 mL/min (ref >60)
Glucose, Bld: 189 mg/dL — ABNORMAL HIGH (ref 70–99)
Potassium: 3.6 mmol/L (ref 3.5–5.1)
Sodium: 135 mmol/L (ref 135–145)

## 2024-08-10 LAB — CBC
HCT: 39.2 % (ref 36.0–46.0)
Hemoglobin: 12.4 g/dL (ref 12.0–15.0)
MCH: 25.8 pg — ABNORMAL LOW (ref 26.0–34.0)
MCHC: 31.6 g/dL (ref 30.0–36.0)
MCV: 81.7 fL (ref 80.0–100.0)
Platelets: 356 K/uL (ref 150–400)
RBC: 4.8 MIL/uL (ref 3.87–5.11)
RDW: 13.6 % (ref 11.5–15.5)
WBC: 15.5 K/uL — ABNORMAL HIGH (ref 4.0–10.5)
nRBC: 0 % (ref 0.0–0.2)

## 2024-08-10 LAB — EXPECTORATED SPUTUM ASSESSMENT W GRAM STAIN, RFLX TO RESP C

## 2024-08-10 LAB — MAGNESIUM: Magnesium: 2 mg/dL (ref 1.7–2.4)

## 2024-08-10 MED ORDER — PANTOPRAZOLE SODIUM 40 MG PO TBEC
40.0000 mg | DELAYED_RELEASE_TABLET | Freq: Every day | ORAL | Status: DC
  Administered 2024-08-10 – 2024-08-16 (×7): 40 mg via ORAL
  Filled 2024-08-10 (×7): qty 1

## 2024-08-10 MED ORDER — ESCITALOPRAM OXALATE 10 MG PO TABS
10.0000 mg | ORAL_TABLET | Freq: Every day | ORAL | Status: DC
  Administered 2024-08-10 – 2024-08-16 (×7): 10 mg via ORAL
  Filled 2024-08-10 (×7): qty 1

## 2024-08-10 MED ORDER — EZETIMIBE 10 MG PO TABS
10.0000 mg | ORAL_TABLET | Freq: Every day | ORAL | Status: DC
  Administered 2024-08-10 – 2024-08-16 (×7): 10 mg via ORAL
  Filled 2024-08-10 (×7): qty 1

## 2024-08-10 MED ORDER — SIMVASTATIN 20 MG PO TABS
40.0000 mg | ORAL_TABLET | Freq: Every day | ORAL | Status: DC
Start: 2024-08-10 — End: 2024-08-16
  Administered 2024-08-10 – 2024-08-15 (×6): 40 mg via ORAL
  Filled 2024-08-10 (×7): qty 2

## 2024-08-10 NOTE — Plan of Care (Signed)

## 2024-08-10 NOTE — Progress Notes (Signed)
 Physical Therapy Treatment Patient Details Name: Veronica Hansen MRN: [id number] DOB: June 04, 1946 Today's Date: 08/10/2024   History of Present Illness Patient presenting to the ED with COVID and SOB for the last 5 days on 08/08/24. Admitted with the same diagnosis. Noted to have A-fib and potential pneumonia. PMH includes:COPD, A-fib/flutter not on AC, OSA, choledocholithiasis, partial gastrectomy, HTN, HLD, DM-2 and osteoarthritis    PT Comments  Patient progressing with ambulation distance though still reliant on 4L O2 for maintaining SpO2 > 90%.  She was up in the room with tech after toileting and managing on RA though SpO2 86%.  Feel she should be stable for home though may benefit from pulmonary rehab outpatient when stable.    If plan is discharge home, recommend the following: Help with stairs or ramp for entrance   Can travel by private vehicle        Equipment Recommendations  None recommended by PT    Recommendations for Other Services       Precautions / Restrictions Precautions Precautions: Fall Recall of Precautions/Restrictions: Intact Precaution/Restrictions Comments: watch o2     Mobility  Bed Mobility               General bed mobility comments: in bathroom on entry, in recliner end of session    Transfers     Transfers: Sit to/from Stand Sit to Stand: Modified independent (Device/Increase time)                Ambulation/Gait Ambulation/Gait assistance: Modified independent (Device/Increase time) Gait Distance (Feet): 350 Feet Assistive device: Rolling walker (2 wheels) Gait Pattern/deviations: Step-through pattern       General Gait Details: on RA in room adjusting items in her closet, SpO2 86% with ambulation on 4L SpO2 90%; using walker though in room no device and some evidence of instability   Stairs             Wheelchair Mobility     Tilt Bed    Modified Rankin (Stroke Patients Only)       Balance Overall  balance assessment: Needs assistance Sitting-balance support: Feet supported Sitting balance-Leahy Scale: Normal     Standing balance support: No upper extremity supported Standing balance-Leahy Scale: Fair Standing balance comment: no UE support while managing items in her closet                            Communication Communication Communication: No apparent difficulties  Cognition Arousal: Alert Behavior During Therapy: WFL for tasks assessed/performed   PT - Cognitive impairments: No apparent impairments                         Following commands: Intact      Cueing Cueing Techniques: Verbal cues  Exercises      General Comments        Pertinent Vitals/Pain Pain Assessment Pain Assessment: No/denies pain    Home Living                          Prior Function            PT Goals (current goals can now be found in the care plan section) Progress towards PT goals: Progressing toward goals    Frequency    Min 2X/week      PT Plan      Co-evaluation  AM-PAC PT 6 Clicks Mobility   Outcome Measure  Help needed turning from your back to your side while in a flat bed without using bedrails?: None Help needed moving from lying on your back to sitting on the side of a flat bed without using bedrails?: None Help needed moving to and from a bed to a chair (including a wheelchair)?: None Help needed standing up from a chair using your arms (e.g., wheelchair or bedside chair)?: None Help needed to walk in hospital room?: A Little Help needed climbing 3-5 steps with a railing? : A Little 6 Click Score: 22    End of Session Equipment Utilized During Treatment: Oxygen Activity Tolerance: Patient tolerated treatment well Patient left: in chair;with call bell/phone within reach         Time: 1355-1418 PT Time Calculation (min) (ACUTE ONLY): 23 min  Charges:    $Gait Training: 8-22 mins PT General  Charges $$ ACUTE PT VISIT: 1 Visit                     Micheline Portal, PT Acute Rehabilitation Services Office:[phone] 08/10/2024    Montie Portal 08/10/2024, 5:20 PM

## 2024-08-10 NOTE — TOC CM/SW Note (Signed)
 Transition of Care [facility]) - Inpatient Brief Assessment   Patient Details  Name: Veronica Hansen MRN: [id number] Date of Birth: 27-Jul-1946  Transition of [facility]) CM/SW Contact:    Waddell Barnie Rama, RN Phone Number: 08/10/2024, 3:24 PM   Clinical Narrative: From home alone, has PCP and insurance on file, states has no HH services in place at this time , has walker/cane  at home.  States family member  (daughter) will transport them home at [employer] and family is support system, states gets medications from [facility] in [place].  Pta self ambulatory with cane.   Per pt eval rec HHPT,  NCM offered choice, she has no preference,  NCM made referral to Amy with [facility], she is able to take referral.  Soc will begin 24 to 48 hrs post dc.    Transition of Care Asessment: Insurance and Status: Insurance coverage has been reviewed Patient has primary care physician: Yes Home environment has been reviewed: home alone Prior level of function:: indep Prior/Current Home Services: No current home services Social Drivers of Health Review: SDOH reviewed no interventions necessary Readmission risk has been reviewed: Yes Transition of care needs: transition of care needs identified, TOC will continue to follow

## 2024-08-10 NOTE — Progress Notes (Signed)
 RT found patient on 4L [place] sats 83%. Patient changed to a HFNC 9L, sats improved to 91%. RN notified.

## 2024-08-10 NOTE — Progress Notes (Signed)
 Mobility Specialist: Progress Note   08/10/24 1257  Mobility  Activity Ambulated with assistance  Level of Assistance Standby assist, set-up cues, supervision of patient - no hands on  Assistive Device Front wheel walker  Distance Ambulated (ft) 200 ft  Activity Response Tolerated well  Mobility Referral Yes  Mobility visit 1 Mobility  Mobility Specialist Start Time (ACUTE ONLY) 0920  Mobility Specialist Stop Time (ACUTE ONLY) 0943  Mobility Specialist Time Calculation (min) (ACUTE ONLY) 23 min    Pt received on EOB, agreeable to mobility session. Received on 10LO2, SpO2 >90%. SV w/ RW throughout ambulation. Ambulated ~150' on RA, SpO2 desat to 87-88%. Pt c/o SOB, denies dizziness or lightheadedness. Returned pt to room. SpO2 maintained 88% on RA. Pt titrated to 5LO2 and pt recovered to SpO2 91-95%. Left on EOB on 5LO2, all needs met, call bell in reach. RN aware.   Ileana Lute Mobility Specialist Please contact via SecureChat or Rehab office at [phone]

## 2024-08-10 NOTE — Plan of Care (Signed)
 Patient calm and cooperative A&O X4, with productive cough, family visiting at bedside. Patient left in chair as requested on 4L nasal canula.  Problem: Education: Goal: Knowledge of General Education information will improve Description: Including pain rating scale, medication(s)/side effects and non-pharmacologic comfort measures Outcome: Progressing   Problem: Health Behavior/Discharge Planning: Goal: Ability to manage health-related needs will improve Outcome: Progressing   Problem: Clinical Measurements: Goal: Ability to maintain clinical measurements within normal limits will improve Outcome: Progressing   Problem: Activity: Goal: Risk for activity intolerance will decrease Outcome: Progressing   Problem: Nutrition: Goal: Adequate nutrition will be maintained Outcome: Progressing   Problem: Coping: Goal: Level of anxiety will decrease Outcome: Progressing   Problem: Pain Managment: Goal: General experience of comfort will improve and/or be controlled Outcome: Progressing   Problem: Safety: Goal: Ability to remain free from injury will improve Outcome: Progressing

## 2024-08-10 NOTE — Progress Notes (Signed)
 Progress Note   Patient: Veronica Hansen FMW:[id number] DOB: 1946/06/14 DOA: 08/08/2024     1 DOS: the patient was seen and examined on 08/10/2024   Brief hospital course: Veronica Hansen is a 78 y.o. female with past medical history of atrial flutter status post ablation, obesity status post stapling, obstructive sleep apnea on CPAP, aortic aneurysm presented to hospital with shortness of breath cough.  Of note patient was recently diagnosed of COVID infection on 08/04/2024 and received 5-day course of Paxlovid.  Patient however continued to have shortness of breath even with minimal exertion and presented to the hospital.  She was noted to be hypoxic with pulse ox in the mid 80s on room air.  Labs showed mild leukocytosis at 13.8.  proBNP 93.  Patient tested positive for COVID by PCR.  Procalcitonin was less than 0.10.  CT angiogram of the chest was negative for PE but left-sided infiltrate suggestive pneumonia.  Patient was then considered for admission to hospital for further evaluation and treatment.    Assessment/Plan  Acute hypoxic respiratory failure secondary to bacterial infection superimposed on COVID-19 pneumonia.   Initial pulse ox in the 80s. CRP elevated at 28.7 with ESR of 54. - Currently requiring titration between 5-9 L HFNC to maintain SpO2 between 88-92%. Wean as tolerated - Currently on Rocephin  and Zithromax  - Sputum culture pending - Continue Solu-Medrol  40 mg daily, DuoNebs every 6 hours - Continuous pulse ox   COVID-19 viral infection Initially diagnosed 08/04/2024.  Plan to continue isolation until 08/14/2024. Supportive care including steroids.    Leukocytosis Worsening WBC to 15.5, could be secondary to underlying bacterial pneumonia versus steroids Continue to monitor.  On antibiotic treatment for pneumonia.  Borderline hypotension.   SBP ranging from 104-146, stable so far  COPD Former smoking history - Not on home O2 or inhalers - Smoking 2 ppd for  20 years, stopped 25 years ago  Deconditioning debility. PT OT following for evaluation  Obstructive sleep apnea.   - Continue CPAP at nighttime  Class III obesity Body mass index is 43.26 kg/m.  Patient would benefit from ongoing weight loss as outpatient.       Subjective: Patient seen sitting in chair.  Reports feeling a little bit better, although states she becomes short of breath when having to move around and while working with PT.  Reports productive cough, able to provide sputum sample for culture  Physical Exam: Vitals:   08/10/24 0900 08/10/24 1232 08/10/24 1419 08/10/24 1606  BP:  (!) 116/92  104/74  Pulse:  74 84 85  Resp:  14 20 20   Temp:    97.8 F (36.6 C)  TempSrc:    Oral  SpO2: 95% 95% 97% 96%  Weight:      Height:       Physical Exam Vitals reviewed.  Constitutional:      Appearance: She is ill-appearing.  Cardiovascular:     Rate and Rhythm: Normal rate and regular rhythm.  Pulmonary:     Breath sounds: Wheezing (Diffuse wheezing bilaterally) present.     Comments: HFNC in place Abdominal:     Palpations: Abdomen is soft.     Tenderness: There is no abdominal tenderness.  Skin:    General: Skin is warm.     Capillary Refill: Capillary refill takes less than 2 seconds.  Neurological:     Mental Status: She is alert and oriented to person, place, and time.     Family Communication: None  Disposition: Status is: Inpatient Remains inpatient appropriate because: increasing O2 requirement, shortness of breath  Planned Discharge Destination: Home    Time spent: 45 minutes  Author: Duffy Larch, MD 08/10/2024 6:45 PM  For on call review [email or web address].

## 2024-08-11 DIAGNOSIS — J9601 Acute respiratory failure with hypoxia: Secondary | ICD-10-CM | POA: Diagnosis not present

## 2024-08-11 LAB — CBC
HCT: 42.1 % (ref 36.0–46.0)
Hemoglobin: 13.4 g/dL (ref 12.0–15.0)
MCH: 26 pg (ref 26.0–34.0)
MCHC: 31.8 g/dL (ref 30.0–36.0)
MCV: 81.7 fL (ref 80.0–100.0)
Platelets: 459 K/uL — ABNORMAL HIGH (ref 150–400)
RBC: 5.15 MIL/uL — ABNORMAL HIGH (ref 3.87–5.11)
RDW: 14.1 % (ref 11.5–15.5)
WBC: 19.1 K/uL — ABNORMAL HIGH (ref 4.0–10.5)
nRBC: 0 % (ref 0.0–0.2)

## 2024-08-11 LAB — BASIC METABOLIC PANEL WITH GFR
Anion gap: 15 (ref 5–15)
BUN: 22 mg/dL (ref 8–23)
CO2: 22 mmol/L (ref 22–32)
Calcium: 9.8 mg/dL (ref 8.9–10.3)
Chloride: 100 mmol/L (ref 98–111)
Creatinine, Ser: 0.83 mg/dL (ref 0.44–1.00)
GFR, Estimated: >60 mL/min (ref >60)
Glucose, Bld: 170 mg/dL — ABNORMAL HIGH (ref 70–99)
Potassium: 4.3 mmol/L (ref 3.5–5.1)
Sodium: 137 mmol/L (ref 135–145)

## 2024-08-11 MED ORDER — IPRATROPIUM-ALBUTEROL 0.5-2.5 (3) MG/3ML IN SOLN
3.0000 mL | Freq: Two times a day (BID) | RESPIRATORY_TRACT | Status: DC
  Administered 2024-08-11 – 2024-08-15 (×8): 3 mL via RESPIRATORY_TRACT
  Filled 2024-08-11 (×8): qty 3

## 2024-08-11 MED ORDER — BUDESONIDE 0.25 MG/2ML IN SUSP
0.2500 mg | Freq: Two times a day (BID) | RESPIRATORY_TRACT | Status: DC
  Administered 2024-08-11 – 2024-08-15 (×10): 0.25 mg via RESPIRATORY_TRACT
  Filled 2024-08-11 (×9): qty 2

## 2024-08-11 MED ORDER — AZITHROMYCIN 500 MG PO TABS
500.0000 mg | ORAL_TABLET | Freq: Every day | ORAL | Status: DC
  Administered 2024-08-11: 500 mg via ORAL
  Filled 2024-08-11: qty 1

## 2024-08-11 NOTE — Progress Notes (Signed)
 Progress Note   Patient: Veronica Hansen FMW:[id number] DOB: 04-21-1946 DOA: 08/08/2024     2 DOS: the patient was seen and examined on 08/11/2024   Brief hospital course: Veronica Hansen is a 78 y.o. female with past medical history of atrial flutter status post ablation, obesity status post stapling, obstructive sleep apnea on CPAP, aortic aneurysm presented to hospital with shortness of breath cough.  Of note patient was recently diagnosed of COVID infection on 08/04/2024 and received 5-day course of Paxlovid.  Patient however continued to have shortness of breath even with minimal exertion and presented to the hospital.  She was noted to be hypoxic with pulse ox in the mid 80s on room air.  Labs showed mild leukocytosis at 13.8.  proBNP 93.  Patient tested positive for COVID by PCR.  Procalcitonin was less than 0.10.  CT angiogram of the chest was negative for PE but left-sided infiltrate suggestive pneumonia.  Patient was then considered for admission to hospital for further evaluation and treatment.    Assessment/Plan  Acute hypoxic respiratory failure secondary to bacterial infection superimposed on COVID-19 pneumonia.   Initial pulse ox in the 80s. CRP elevated at 28.7 with ESR of 54. - Currently requiring titration weaned down to 5 L HFNC to maintain SpO2 between 88-92%. Wean as tolerated - Currently on Rocephin  and Zithromax  - Continue Solu-Medrol  40 mg daily, DuoNebs BID - Started Budesonide  nebs BID - Continuous pulse ox - Sputum culture pending   COVID-19 viral infection Initially diagnosed 08/04/2024.  Plan to continue isolation until 08/14/2024. Supportive care including steroids.    Leukocytosis Worsening WBC to 19.5. However, patient is afebrile and overall significantly improved clinically. Therefore, more likely secondary to steroid use versus secondary to underlying bacterial pneumonia Continue to monitor.  On antibiotic treatment for pneumonia.  Borderline  hypotension - resolved  COPD Former smoking history - Not on home O2 or inhalers - Smoking 2 ppd for 20 years, stopped 25 years ago  Deconditioning debility. PT OT following for evaluation  Obstructive sleep apnea.   - Continue CPAP at nighttime  Class III obesity Body mass index is 43.26 kg/m.  Patient would benefit from ongoing weight loss as outpatient.       Subjective: Patient seen sitting in chair.  Reports feeling a significantly better than yesterday.  Also reports having been getting up and walking around and attempting to stay active around her room.  Continues to cough but overall feels better.  Patient's daughter, Tawni, was at bedside.   Physical Exam: Vitals:   08/10/24 1606 08/10/24 2017 08/11/24 0755 08/11/24 0854  BP: 104/74  (!) 147/97   Pulse: 85  78   Resp: 20  20   Temp: 97.8 F (36.6 C)  98 F (36.7 C)   TempSrc: Oral  Oral   SpO2: 96% 96% 90% 93%  Weight:      Height:       Physical Exam Vitals reviewed.  Constitutional:      General: She is not in acute distress.    Appearance: She is not ill-appearing.  Cardiovascular:     Rate and Rhythm: Normal rate and regular rhythm.  Pulmonary:     Breath sounds: Wheezing (Diffuse wheezing bilaterally) present.     Comments: HFNC in place Abdominal:     Palpations: Abdomen is soft.     Tenderness: There is no abdominal tenderness.  Musculoskeletal:     Right lower leg: 1+ Edema present.  Left lower leg: 1+ Edema present.  Skin:    General: Skin is warm.     Capillary Refill: Capillary refill takes less than 2 seconds.  Neurological:     Mental Status: She is alert and oriented to person, place, and time.     Family Communication: Patient's daughter, Tawni, at bedside  Disposition: Home Status is: Inpatient Remains inpatient appropriate because: increased O2 requirement, shortness of breath  Planned Discharge Destination: Home    Time spent: 45 minutes  Author: Duffy Larch, MD 08/11/2024 9:36 AM  For on call review [email or web address].

## 2024-08-11 NOTE — Progress Notes (Signed)
 Incentive spirometer provided to the patient with education complete and return demonstration completed by the patient. Daughter at bedside and voiced understanding as well.

## 2024-08-11 NOTE — Progress Notes (Addendum)
 Occupational Therapy Treatment Patient Details Name: Laurna Shetley MRN: [id number] DOB: 07-31-1946 Today's Date: 08/11/2024   History of present illness Patient presenting to the ED with COVID and SOB for the last 5 days on 08/08/24. Admitted with the same diagnosis. Noted to have A-fib and potential pneumonia. PMH includes:COPD, A-fib/flutter not on AC, OSA, choledocholithiasis, partial gastrectomy, HTN, HLD, DM-2 and osteoarthritis   OT comments  Patient up in recliner upon entry on 5 liters O2 and SpO2 93%.  Patient performed self care at sink, mobility, and functional transfers with supervision for safety due to lines with SpO2 84-86%.  Energy conservation handout provided and reviewed with patient.  Discharge recommendations continue to be appropriate.  Acute OT to continue to follow to address established goals.       If plan is discharge home, recommend the following:  A little help with walking and/or transfers;A little help with bathing/dressing/bathroom;Assistance with cooking/housework;Assist for transportation;Help with stairs or ramp for entrance   Equipment Recommendations  None recommended by OT    Recommendations for Other Services      Precautions / Restrictions Precautions Precautions: Fall Recall of Precautions/Restrictions: Intact Precaution/Restrictions Comments: watch o2 Restrictions Weight Bearing Restrictions Per Provider Order: No       Mobility Bed Mobility               General bed mobility comments: OOB in recliner    Transfers Overall transfer level: Needs assistance Equipment used: None Transfers: Sit to/from Stand Sit to Stand: Modified independent (Device/Increase time)           General transfer comment: supervision for safety     Balance Overall balance assessment: Needs assistance Sitting-balance support: Feet supported Sitting balance-Leahy Scale: Normal     Standing balance support: No upper extremity  supported Standing balance-Leahy Scale: Fair Standing balance comment: stands at sink with no UE support                           ADL either performed or assessed with clinical judgement   ADL Overall ADL's : Needs assistance/impaired     Grooming: Wash/dry hands;Wash/dry face;Oral care;Brushing hair;Supervision/safety;Standing               Lower Body Dressing: Supervision/safety;Sitting/lateral leans   Toilet Transfer: Supervision/safety;Ambulation;Regular Teacher, adult education Details (indicate cue type and reason): without assistive device         Functional mobility during ADLs: Supervision/safety General ADL Comments: on 5 liters O2 with SpO2 in mid 80's% while standing at sink and during mobility    Extremity/Trunk Assessment              Vision       Perception     Praxis     Communication Communication Communication: No apparent difficulties   Cognition Arousal: Alert Behavior During Therapy: WFL for tasks assessed/performed Cognition: No apparent impairments                               Following commands: Intact        Cueing   Cueing Techniques: Verbal cues  Exercises      Shoulder Instructions       General Comments on 5 liters O2 with SpO2 93% in sitting and84-86% during self care and mobility    Pertinent Vitals/ Pain       Pain Assessment Pain Assessment: No/denies pain  Home Living  Prior Functioning/Environment              Frequency  Min 2X/week        Progress Toward Goals  OT Goals(current goals can now be found in the care plan section)  Progress towards OT goals: Progressing toward goals  Acute Rehab OT Goals Patient Stated Goal: feel better OT Goal Formulation: With patient Time For Goal Achievement: 08/23/24 Potential to Achieve Goals: Good ADL Goals Pt Will Perform Lower Body Bathing: with modified  independence;sitting/lateral leans;sit to/from stand Pt Will Perform Lower Body Dressing: with modified independence;sit to/from stand;sitting/lateral leans Pt Will Transfer to Toilet: with modified independence;ambulating;regular height toilet Pt Will Perform Toileting - Clothing Manipulation and hygiene: with modified independence;sitting/lateral leans;sit to/from stand Additional ADL Goal #1: Patient will be able to complete functional task in standing for 3-5 minutes without needing seated rest break in order to increase overall activity tolerance. Additional ADL Goal #2: Patient will be able to name 3 strategies for energy conservation for safe discharge home.  Plan      Co-evaluation                 AM-PAC OT 6 Clicks Daily Activity     Outcome Measure   Help from another person eating meals?: None Help from another person taking care of personal grooming?: A Little Help from another person toileting, which includes using toliet, bedpan, or urinal?: A Little Help from another person bathing (including washing, rinsing, drying)?: A Little Help from another person to put on and taking off regular upper body clothing?: A Little Help from another person to put on and taking off regular lower body clothing?: A Little 6 Click Score: 19    End of Session Equipment Utilized During Treatment: Oxygen (5 liters)  OT Visit Diagnosis: Muscle weakness (generalized) (M62.81);Other abnormalities of gait and mobility (R26.89)   Activity Tolerance Patient tolerated treatment well   Patient Left in chair;with call bell/phone within reach   Nurse Communication Mobility status        Time: 9176-9150 OT Time Calculation (min): 26 min  Charges: OT General Charges $OT Visit: 1 Visit OT Treatments $Self Care/Home Management : 8-22 mins $Therapeutic Activity: 8-22 mins  Dick Laine, OTA Acute Rehabilitation Services  Office [phone]   Jeb LITTIE Laine 08/11/2024, 11:42 AM

## 2024-08-11 NOTE — Plan of Care (Signed)

## 2024-08-12 DIAGNOSIS — J9601 Acute respiratory failure with hypoxia: Secondary | ICD-10-CM | POA: Diagnosis not present

## 2024-08-12 LAB — BASIC METABOLIC PANEL WITH GFR
Anion gap: 12 (ref 5–15)
BUN: 24 mg/dL — ABNORMAL HIGH (ref 8–23)
CO2: 24 mmol/L (ref 22–32)
Calcium: 9.4 mg/dL (ref 8.9–10.3)
Chloride: 99 mmol/L (ref 98–111)
Creatinine, Ser: 0.67 mg/dL (ref 0.44–1.00)
GFR, Estimated: >60 mL/min (ref >60)
Glucose, Bld: 105 mg/dL — ABNORMAL HIGH (ref 70–99)
Potassium: 4.3 mmol/L (ref 3.5–5.1)
Sodium: 135 mmol/L (ref 135–145)

## 2024-08-12 LAB — CBC
HCT: 40.2 % (ref 36.0–46.0)
Hemoglobin: 12.9 g/dL (ref 12.0–15.0)
MCH: 26.2 pg (ref 26.0–34.0)
MCHC: 32.1 g/dL (ref 30.0–36.0)
MCV: 81.7 fL (ref 80.0–100.0)
Platelets: 427 K/uL — ABNORMAL HIGH (ref 150–400)
RBC: 4.92 MIL/uL (ref 3.87–5.11)
RDW: 14.1 % (ref 11.5–15.5)
WBC: 19.7 K/uL — ABNORMAL HIGH (ref 4.0–10.5)
nRBC: 0 % (ref 0.0–0.2)

## 2024-08-12 LAB — GLUCOSE, CAPILLARY: Glucose-Capillary: 182 mg/dL — ABNORMAL HIGH (ref 70–99)

## 2024-08-12 MED ORDER — AMLODIPINE BESYLATE 5 MG PO TABS
5.0000 mg | ORAL_TABLET | Freq: Every day | ORAL | Status: DC
  Administered 2024-08-12 – 2024-08-16 (×5): 5 mg via ORAL
  Filled 2024-08-12 (×5): qty 1

## 2024-08-12 MED ORDER — AZITHROMYCIN 500 MG PO TABS
500.0000 mg | ORAL_TABLET | Freq: Every day | ORAL | Status: AC
  Administered 2024-08-12: 500 mg via ORAL
  Filled 2024-08-12: qty 1

## 2024-08-12 NOTE — Plan of Care (Signed)

## 2024-08-12 NOTE — Progress Notes (Signed)
 Mobility Specialist: Progress Note   08/12/24 1400  Mobility  Activity Ambulated with assistance (Simultaneous filing. User may not have seen previous data.)  Level of Assistance Standby assist, set-up cues, supervision of patient - no hands on  Assistive Device Front wheel walker  Distance Ambulated (ft) 300 ft  Activity Response Tolerated well  Mobility Referral Yes  Mobility visit 1 Mobility  Mobility Specialist Start Time (ACUTE ONLY) 1340  Mobility Specialist Stop Time (ACUTE ONLY) 1357  Mobility Specialist Time Calculation (min) (ACUTE ONLY) 17 min    Pt received in chair, agreeable to mobility session. SV throughout. SpO2 91-92% on 4LO2 throughout ambulation. Feeling a little SOB by EOS but otherwise no complaints. Returned to room without fault. Left in chair with all needs met, call bell in reach.   Ileana Lute Mobility Specialist Please contact via SecureChat or Rehab office at [phone]

## 2024-08-12 NOTE — Progress Notes (Signed)
 Progress Note   Patient: Veronica Hansen FMW:[id number] DOB: April 26, 1946 DOA: 08/08/2024     3 DOS: the patient was seen and examined on 08/12/2024   Brief hospital course: Veronica Hansen is a 78 y.o. female with past medical history of atrial flutter status post ablation, obesity status post stapling, obstructive sleep apnea on CPAP, aortic aneurysm presented to hospital with shortness of breath cough.  Of note patient was recently diagnosed of COVID infection on 08/04/2024 and received 5-day course of Paxlovid.  Patient however continued to have shortness of breath even with minimal exertion and presented to the hospital.  She was noted to be hypoxic with pulse ox in the mid 80s on room air.  Labs showed mild leukocytosis at 13.8.  proBNP 93.  Patient tested positive for COVID by PCR.  Procalcitonin was less than 0.10.  CT angiogram of the chest was negative for PE but left-sided infiltrate suggestive pneumonia.  Patient was then considered for admission to hospital for further evaluation and treatment.    Assessment/Plan  Acute hypoxic respiratory failure secondary to bacterial infection superimposed on COVID-19 pneumonia.   Initial pulse ox in the 80s. CRP elevated at 28.7 with ESR of 54. - Successfully weaned patient down to 4 L [place]. Patient showing significant clinical improvement and resolution of wheezing. - Goal to maintain SpO2 between 88-92%. Wean as tolerated - Will complete 5-day course of azithromycin  today - Continue Rocephin  - Continue Solu-Medrol  40 mg daily for 1 more daily - Continue Budesonide  nebs BID - Continuous pulse ox - Sputum culture prelim negative for Staph aureus or Pseudomonas, pending final cultures   COVID-19 viral infection Initially diagnosed 08/04/2024.  Plan to continue isolation until 08/14/2024. Supportive care including steroids.    Leukocytosis Worsening WBC to 19.7. However, patient is afebrile and overall significantly improved clinically.  Therefore, more likely secondary to steroid use versus secondary to underlying bacterial pneumonia Continue to monitor.  On antibiotic treatment for pneumonia.  Borderline hypotension - resolved Primary hypertension - BP elevated to 140s - Home regimen includes amlodipine  5 mg and olmesartan 40 mg - Resumed home amlodipine    COPD Former smoking history - Not on home O2 or inhalers - Smoking 2 ppd for 20 years, stopped 25 years ago  Deconditioning debility. PT OT following for evaluation  Obstructive sleep apnea.   - Continue CPAP at nighttime  Class III obesity Body mass index is 43.26 kg/m.  Patient would benefit from ongoing weight loss as outpatient.       Subjective: Patient seen sitting in chair.  Reports further significant improvement than yesterday.  Continues being active around her room. Patient's daughters and grand daughter at bedside.   Physical Exam: Vitals:   08/11/24 1225 08/11/24 1954 08/11/24 2140 08/12/24 0600  BP: (!) 144/79  134/77 139/88  Pulse: 83  66 72  Resp: 18  16 18   Temp: 97.8 F (36.6 C)  98 F (36.7 C) 97.6 F (36.4 C)  TempSrc: Oral  Oral Oral  SpO2: 92% 98%  97%  Weight:      Height:       Physical Exam Vitals reviewed.  Constitutional:      General: She is not in acute distress.    Appearance: She is not ill-appearing.  Cardiovascular:     Rate and Rhythm: Normal rate and regular rhythm.  Pulmonary:     Breath sounds: No wheezing.     Comments: Wright in place Abdominal:     Palpations:  Abdomen is soft.     Tenderness: There is no abdominal tenderness.  Musculoskeletal:     Right lower leg: 1+ Edema present.     Left lower leg: 1+ Edema present.  Skin:    General: Skin is warm.     Capillary Refill: Capillary refill takes less than 2 seconds.  Neurological:     Mental Status: She is alert and oriented to person, place, and time.     Family Communication: Two of the patient's daughter and her granddaughter at  bedisde  Disposition: Home Status is: Inpatient Remains inpatient appropriate because: increased O2 requirement, shortness of breath  Planned Discharge Destination: Home    Time spent: 45 minutes  Author: Duffy Larch, MD 08/12/2024 7:18 AM  For on call review [email or web address].

## 2024-08-12 NOTE — Plan of Care (Signed)
  Problem: Health Behavior/Discharge Planning: Goal: Ability to manage health-related needs will improve 08/12/2024 0717 by Eduardo Sandrea LABOR, RN Outcome: Progressing 08/12/2024 0717 by Eduardo Sandrea LABOR, RN Outcome: Progressing   Problem: Clinical Measurements: Goal: Ability to maintain clinical measurements within normal limits will improve 08/12/2024 0717 by Eduardo Sandrea LABOR, RN Outcome: Progressing 08/12/2024 0717 by Eduardo Sandrea LABOR, RN Outcome: Progressing   Problem: Clinical Measurements: Goal: Ability to maintain clinical measurements within normal limits will improve 08/12/2024 0717 by Eduardo Sandrea LABOR, RN Outcome: Progressing 08/12/2024 0717 by Eduardo Sandrea LABOR, RN Outcome: Progressing Goal: Will remain free from infection 08/12/2024 0717 by Eduardo Sandrea LABOR, RN Outcome: Progressing 08/12/2024 0717 by Eduardo Sandrea LABOR, RN Outcome: Progressing Goal: Diagnostic test results will improve 08/12/2024 0717 by Eduardo Sandrea LABOR, RN Outcome: Progressing 08/12/2024 0717 by Eduardo Sandrea LABOR, RN Outcome: Progressing Goal: Respiratory complications will improve 08/12/2024 0717 by Eduardo Sandrea LABOR, RN Outcome: Progressing 08/12/2024 0717 by Eduardo Sandrea LABOR, RN Outcome: Progressing Goal: Cardiovascular complication will be avoided 08/12/2024 0717 by Eduardo Sandrea LABOR, RN Outcome: Progressing 08/12/2024 0717 by Eduardo Sandrea LABOR, RN Outcome: Progressing   Problem: Education: Goal: Knowledge of General Education information will improve Description: Including pain rating scale, medication(s)/side effects and non-pharmacologic comfort measures 08/12/2024 0717 by Eduardo Sandrea LABOR, RN Outcome: Progressing 08/12/2024 0717 by Eduardo Sandrea LABOR, RN Outcome: Progressing   Problem: Nutrition: Goal: Adequate nutrition will be maintained 08/12/2024 0717 by Eduardo Sandrea LABOR, RN Outcome: Progressing 08/12/2024 0717 by Eduardo Sandrea LABOR, RN Outcome: Progressing   Problem: Activity: Goal: Risk for  activity intolerance will decrease 08/12/2024 0717 by Eduardo Sandrea LABOR, RN Outcome: Progressing 08/12/2024 0717 by Eduardo Sandrea LABOR, RN Outcome: Progressing   Problem: Elimination: Goal: Will not experience complications related to bowel motility 08/12/2024 0717 by Eduardo Sandrea LABOR, RN Outcome: Progressing 08/12/2024 0717 by Eduardo Sandrea LABOR, RN Outcome: Progressing Goal: Will not experience complications related to urinary retention 08/12/2024 0717 by Eduardo Sandrea LABOR, RN Outcome: Progressing 08/12/2024 0717 by Eduardo Sandrea LABOR, RN Outcome: Progressing   Problem: Pain Managment: Goal: General experience of comfort will improve and/or be controlled 08/12/2024 0717 by Eduardo Sandrea LABOR, RN Outcome: Progressing 08/12/2024 0717 by Eduardo Sandrea LABOR, RN Outcome: Progressing   Problem: Skin Integrity: Goal: Risk for impaired skin integrity will decrease 08/12/2024 0717 by Eduardo Sandrea LABOR, RN Outcome: Progressing 08/12/2024 0717 by Eduardo Sandrea LABOR, RN Outcome: Progressing   Problem: Safety: Goal: Ability to remain free from injury will improve 08/12/2024 0717 by Eduardo Sandrea LABOR, RN Outcome: Progressing 08/12/2024 0717 by Eduardo Sandrea LABOR, RN Outcome: Progressing

## 2024-08-13 DIAGNOSIS — J9601 Acute respiratory failure with hypoxia: Secondary | ICD-10-CM | POA: Diagnosis not present

## 2024-08-13 LAB — CULTURE, RESPIRATORY W GRAM STAIN
Culture: NORMAL
Gram Stain: NONE SEEN

## 2024-08-13 LAB — CBC
HCT: 39.7 % (ref 36.0–46.0)
Hemoglobin: 12.7 g/dL (ref 12.0–15.0)
MCH: 26.1 pg (ref 26.0–34.0)
MCHC: 32 g/dL (ref 30.0–36.0)
MCV: 81.7 fL (ref 80.0–100.0)
Platelets: 355 K/uL (ref 150–400)
RBC: 4.86 MIL/uL (ref 3.87–5.11)
RDW: 14 % (ref 11.5–15.5)
WBC: 18 K/uL — ABNORMAL HIGH (ref 4.0–10.5)
nRBC: 0 % (ref 0.0–0.2)

## 2024-08-13 LAB — BASIC METABOLIC PANEL WITH GFR
Anion gap: 7 (ref 5–15)
BUN: 23 mg/dL (ref 8–23)
CO2: 26 mmol/L (ref 22–32)
Calcium: 9.1 mg/dL (ref 8.9–10.3)
Chloride: 104 mmol/L (ref 98–111)
Creatinine, Ser: 0.72 mg/dL (ref 0.44–1.00)
GFR, Estimated: >60 mL/min (ref >60)
Glucose, Bld: 116 mg/dL — ABNORMAL HIGH (ref 70–99)
Potassium: 4.5 mmol/L (ref 3.5–5.1)
Sodium: 137 mmol/L (ref 135–145)

## 2024-08-13 LAB — GLUCOSE, CAPILLARY: Glucose-Capillary: 185 mg/dL — ABNORMAL HIGH (ref 70–99)

## 2024-08-13 NOTE — Progress Notes (Signed)
 Progress Note   Patient: Veronica Hansen FMW:[id number] DOB: June 14, 1946 DOA: 08/08/2024     4 DOS: the patient was seen and examined on 08/13/2024   Brief hospital course: Veronica Hansen is a 78 y.o. female with past medical history of atrial flutter status post ablation, obesity status post stapling, obstructive sleep apnea on CPAP, aortic aneurysm presented to hospital with shortness of breath cough.  Of note patient was recently diagnosed of COVID infection on 08/04/2024 and received 5-day course of Paxlovid.  Patient however continued to have shortness of breath even with minimal exertion and presented to the hospital.  She was noted to be hypoxic with pulse ox in the mid 80s on room air.  Labs showed mild leukocytosis at 13.8.  proBNP 93.  Patient tested positive for COVID by PCR.  Procalcitonin was less than 0.10.  CT angiogram of the chest was negative for PE but left-sided infiltrate suggestive pneumonia.  Patient was then considered for admission to hospital for further evaluation and treatment.    Assessment/Plan  Acute hypoxic respiratory failure secondary to bacterial infection superimposed on COVID-19 pneumonia.   Initial pulse ox in the 80s. CRP elevated at 28.7 with ESR of 54. - Successfully weaned patient down to 3 L [place] including while ambulating with PT. Patient showing significant clinical improvement and resolution of wheezing. - Goal to maintain SpO2 between 88-92%. Wean as tolerated - Completed 5-day course of azithromycin  on 9/27 - Continue Rocephin  since still requiring supplemental oxygen - Stopped Solu-Medrol  today - Continue Budesonide  nebs BID - Continuous pulse ox - Sputum culture negative for Staph aureus or Pseudomonas, pending final cultures   COVID-19 viral infection - Initially diagnosed 08/04/2024 - Plan to continue isolation until 08/14/2024. - Supportive care including steroids.   Leukocytosis - Improvement in WBC count to 18. - Remains afebrile  and overall significantly improved clinically. Therefore, more likely secondary to steroid use versus secondary to underlying bacterial pneumonia  Borderline hypotension - resolved Primary hypertension - BP elevated to 140s - Home regimen includes amlodipine  5 mg and olmesartan 40 mg - Resumed home amlodipine   COPD Former smoking history - Not on home O2 or inhalers - Smoking 2 ppd for 20 years, stopped 25 years ago  Deconditioning debility. - PT OT following for evaluation  Obstructive sleep apnea.   - Continue CPAP at nighttime  Class III obesity - Body mass index is 43.26 kg/m.  - Patient would benefit from ongoing weight loss as outpatient.       Subjective: Patient seen sitting in chair.  Continues to Report significant improvement in her symptoms. Tolerated weaning down O2 to 3L [place] while maintaining an SpO2 > 90% during ambulation with PT.  Continues being active around her room.   Physical Exam: Vitals:   08/13/24 0759 08/13/24 0802 08/13/24 1603 08/13/24 1644  BP:  134/82 122/76   Pulse: 68 84 80   Resp: 18 18    Temp:  98.4 F (36.9 C)    TempSrc:  Oral    SpO2: 94% 94% 98% 92%  Weight:      Height:       Physical Exam Vitals reviewed.  Constitutional:      General: She is not in acute distress.    Appearance: She is not ill-appearing.  Cardiovascular:     Rate and Rhythm: Normal rate and regular rhythm.  Pulmonary:     Breath sounds: No wheezing.     Comments: [place] in place Abdominal:  Palpations: Abdomen is soft.     Tenderness: There is no abdominal tenderness.  Musculoskeletal:     Right lower leg: 1+ Edema present.     Left lower leg: 1+ Edema present.  Skin:    General: Skin is warm.     Capillary Refill: Capillary refill takes less than 2 seconds.  Neurological:     Mental Status: She is alert and oriented to person, place, and time.     Family Communication: Patient's daughter at bedside  Disposition: Home Status is:  Inpatient Remains inpatient appropriate because: increased O2 requirement, shortness of breath  Planned Discharge Destination: Home    Time spent: 45 minutes  Author: Graciano Batson Al-Sultani, MD 08/13/2024 7:14 PM  For on call review [email or web address].

## 2024-08-13 NOTE — Progress Notes (Signed)
 Mobility Specialist: Progress Note   08/13/24 1048  Mobility  Activity Ambulated with assistance  Level of Assistance Standby assist, set-up cues, supervision of patient - no hands on  Assistive Device Front wheel walker  Distance Ambulated (ft) 300 ft  Activity Response Tolerated well  Mobility Referral Yes  Mobility visit 1 Mobility  Mobility Specialist Start Time (ACUTE ONLY) 0955  Mobility Specialist Stop Time (ACUTE ONLY) 1015  Mobility Specialist Time Calculation (min) (ACUTE ONLY) 20 min    During Mobility: SpO2 94% 4LO2, 92% 3LO2 Post Mobility: SpO2 95% 4LO2  Pt received in chair, agreeable to mobility session. SV throughout. No complaints, denies any SOB or dizziness. Pt initially on 4LO2 but titrated to 3LO2 mid-way through ambulation. SpO2 maintained >90% throughout. Returned to room. Left in chair on 4LO2 with all needs met, call bell in reach.   Ileana Lute Mobility Specialist Please contact via SecureChat or Rehab office at [phone]

## 2024-08-13 NOTE — Plan of Care (Signed)

## 2024-08-14 ENCOUNTER — Other Ambulatory Visit

## 2024-08-14 DIAGNOSIS — J9601 Acute respiratory failure with hypoxia: Secondary | ICD-10-CM | POA: Diagnosis not present

## 2024-08-14 LAB — BASIC METABOLIC PANEL WITH GFR
Anion gap: 12 (ref 5–15)
BUN: 19 mg/dL (ref 8–23)
CO2: 26 mmol/L (ref 22–32)
Calcium: 9.5 mg/dL (ref 8.9–10.3)
Chloride: 101 mmol/L (ref 98–111)
Creatinine, Ser: 0.65 mg/dL (ref 0.44–1.00)
GFR, Estimated: >60 mL/min (ref >60)
Glucose, Bld: 92 mg/dL (ref 70–99)
Potassium: 4.2 mmol/L (ref 3.5–5.1)
Sodium: 139 mmol/L (ref 135–145)

## 2024-08-14 LAB — CBC
HCT: 43.2 % (ref 36.0–46.0)
Hemoglobin: 13.9 g/dL (ref 12.0–15.0)
MCH: 26.2 pg (ref 26.0–34.0)
MCHC: 32.2 g/dL (ref 30.0–36.0)
MCV: 81.5 fL (ref 80.0–100.0)
Platelets: 380 K/uL (ref 150–400)
RBC: 5.3 MIL/uL — ABNORMAL HIGH (ref 3.87–5.11)
RDW: 14.2 % (ref 11.5–15.5)
WBC: 18.9 K/uL — ABNORMAL HIGH (ref 4.0–10.5)
nRBC: 0 % (ref 0.0–0.2)

## 2024-08-14 MED ORDER — IRBESARTAN 300 MG PO TABS
300.0000 mg | ORAL_TABLET | Freq: Every day | ORAL | Status: DC
  Administered 2024-08-14: 300 mg via ORAL
  Filled 2024-08-14: qty 1

## 2024-08-14 NOTE — Plan of Care (Signed)

## 2024-08-14 NOTE — Progress Notes (Signed)
 Progress Note   Patient: Veronica Hansen FMW:[id number] DOB: July 13, 1946 DOA: 08/08/2024     5 DOS: the patient was seen and examined on 08/14/2024   Brief hospital course: Veronica Hansen is a 78 y.o. female with past medical history of atrial flutter status post ablation, obesity status post stapling, obstructive sleep apnea on CPAP, aortic aneurysm presented to hospital with shortness of breath cough.  Of note patient was recently diagnosed of COVID infection on 08/04/2024 and received 5-day course of Paxlovid.  Patient however continued to have shortness of breath even with minimal exertion and presented to the hospital.  She was noted to be hypoxic with pulse ox in the mid 80s on room air.  Labs showed mild leukocytosis at 13.8.  proBNP 93.  Patient tested positive for COVID by PCR.  Procalcitonin was less than 0.10.  CT angiogram of the chest was negative for PE but left-sided infiltrate suggestive pneumonia.  Patient was then considered for admission to hospital for further evaluation and treatment.    Assessment/Plan  Acute hypoxic respiratory failure secondary to bacterial infection superimposed on COVID-19 pneumonia - resolving Initial pulse ox in the 80s. CRP elevated at 28.7 with ESR of 54. - Successfully weaned patient down RA today maintaining an SpO2 > 90%. Patient showing significant clinical improvement and resolution of wheezing. - Sputum culture negative for Staph aureus or Pseudomonas, pending final cultures - Goal to maintain SpO2 between 88-92% - Completed 5-day course of azithromycin  on 9/27 - Completed 5 days of Solu-Medrol  40 mg daily - Continue Rocephin , end date 9/30 - Continue Budesonide  nebs BID - Continuous pulse ox - Ambulatory referral to pulmonology and pulmonary rehab with prior to discharge.  Recommend formal PFTs.   COVID-19 viral infection - Initially diagnosed 08/04/2024 - Plan to continue isolation until 08/14/2024. - Supportive  care  Leukocytosis - WBC 18.9 - Remains afebrile and overall significantly improved clinically. Therefore, more likely secondary to steroid use versus secondary to underlying bacterial pneumonia  Borderline hypotension - resolved Primary hypertension - BP elevated to 140s - Home regimen includes amlodipine  5 mg and olmesartan 40 mg - Resumed home amlodipine  5 mg and olmesartan 40 mg daily  COPD Former smoking history - Not on home O2 or inhalers - Smoking 2 ppd for 20 years, stopped 25 years ago  Deconditioning debility. - PT OT following for evaluation  Obstructive sleep apnea.   - Continue CPAP at nighttime  Class III obesity - Body mass index is 43.26 kg/m.  - Patient would benefit from ongoing weight loss as outpatient.       Subjective: Patient seen sitting in chair.  Continues to report significant improvement in her symptoms.  Weaned down to room air this morning and was able to maintain SpO2 greater than 90%.  Denies chest pain, shortness of breath, abdominal pain, nausea, vomiting.  Physical Exam: Vitals:   08/14/24 0800 08/14/24 0837 08/14/24 0842 08/14/24 0854  BP: (!) 146/90   (!) 142/82  Pulse: 65     Resp: 18     Temp: 97.7 F (36.5 C)     TempSrc: Oral     SpO2: 93% 91% 92%   Weight:      Height:       Physical Exam Vitals reviewed.  Constitutional:      General: She is not in acute distress.    Appearance: She is not ill-appearing.  Cardiovascular:     Rate and Rhythm: Normal rate and regular rhythm.  Pulmonary:     Effort: Pulmonary effort is normal.     Breath sounds: Normal breath sounds. No wheezing.  Abdominal:     Palpations: Abdomen is soft.     Tenderness: There is no abdominal tenderness.  Musculoskeletal:     Right lower leg: 1+ Edema present.     Left lower leg: 1+ Edema present.  Skin:    General: Skin is warm.     Capillary Refill: Capillary refill takes less than 2 seconds.  Neurological:     Mental Status: She is alert  and oriented to person, place, and time.     Family Communication: Patient's daughter at bedside  Disposition: Home Status is: Inpatient Remains inpatient appropriate because: increased O2 requirement, shortness of breath  Planned Discharge Destination: Home    Time spent: 45 minutes  Author: Duffy Larch, MD 08/14/2024 5:34 PM  For on call review [email or web address].

## 2024-08-14 NOTE — Evaluation (Signed)
 Occupational Therapy Evaluation and Discharge Patient Details Name: Aaisha Sliter MRN: [id number] DOB: [birth year] Today's Date: 08/14/2024   History of Present Illness   Patient presenting to the ED with COVID and SOB for the last 5 days on 08/08/24. Admitted with the same diagnosis. Noted to have A-fib and potential pneumonia. PMH includes:COPD, A-fib/flutter not on AC, OSA, choledocholithiasis, partial gastrectomy, HTN, HLD, DM-2 and osteoarthritis     Clinical Impressions Pt on RA with O2 sats 91-93% in chair and in standing. Pt has been routinely ambulating, toileting and standing at sink for grooming without assist. She is able to shower in standing with supervision for safety. Pt is faithful to IS use. Reinforced pursed lip breathing and energy conservation strategies. Pt not interested in shower seat for home. Pt has a rollator at home, discussed benefits of use for energy conservation especially when leaving her home. Pt receptive to all education. No further OT needs.      If plan is discharge home, recommend the following:   Assistance with cooking/housework;Assist for transportation;Help with stairs or ramp for entrance     Functional Status Assessment         Equipment Recommendations   None recommended by OT     Recommendations for Other Services         Precautions/Restrictions   Precautions Precaution/Restrictions Comments: watch o2 Restrictions Weight Bearing Restrictions Per Provider Order: No     Mobility Bed Mobility               General bed mobility comments: OOB in recliner    Transfers Overall transfer level: Independent Equipment used: None               General transfer comment: stood multiple times from recliner without instability      Balance     Sitting balance-Leahy Scale: Normal       Standing balance-Leahy Scale: Good                             ADL either performed or assessed with  clinical judgement   ADL Overall ADL's : Modified independent                                     Functional mobility during ADLs: Modified independent General ADL Comments: ambulating to bathroom without AD, showered with daughter's supervision in standing     Vision         Perception         Praxis         Pertinent Vitals/Pain Pain Assessment Pain Assessment: No/denies pain     Extremity/Trunk Assessment             Communication Communication Communication: No apparent difficulties   Cognition Arousal: Alert Behavior During Therapy: WFL for tasks assessed/performed Cognition: No apparent impairments                               Following commands: Intact       Cueing  General Comments   Cueing Techniques: Verbal cues      Exercises     Shoulder Instructions      Home Living  Prior Functioning/Environment                      OT Problem List:     OT Treatment/Interventions:        OT Goals(Current goals can be found in the care plan section)       OT Frequency:       Co-evaluation              AM-PAC OT 6 Clicks Daily Activity     Outcome Measure Help from another person eating meals?: None Help from another person taking care of personal grooming?: None Help from another person toileting, which includes using toliet, bedpan, or urinal?: None Help from another person bathing (including washing, rinsing, drying)?: None Help from another person to put on and taking off regular upper body clothing?: None Help from another person to put on and taking off regular lower body clothing?: None 6 Click Score: 24   End of Session    Activity Tolerance: Patient tolerated treatment well Patient left: in chair;with call bell/phone within reach;with family/visitor present  OT Visit Diagnosis: Other (comment) (decreased activity  tolerance)                Time: 1335-1350 OT Time Calculation (min): 15 min Charges:  OT General Charges $OT Visit: 1 Visit OT Treatments $Self Care/Home Management : 8-22 mins  Kennth Mliss Helling 08/14/2024, 2:47 PM Mliss HERO, OTR/L Acute Rehabilitation Services Office: [phone]

## 2024-08-15 DIAGNOSIS — J9601 Acute respiratory failure with hypoxia: Secondary | ICD-10-CM | POA: Diagnosis not present

## 2024-08-15 LAB — BASIC METABOLIC PANEL WITH GFR
Anion gap: 10 (ref 5–15)
BUN: 22 mg/dL (ref 8–23)
CO2: 26 mmol/L (ref 22–32)
Calcium: 8.9 mg/dL (ref 8.9–10.3)
Chloride: 103 mmol/L (ref 98–111)
Creatinine, Ser: 0.61 mg/dL (ref 0.44–1.00)
GFR, Estimated: >60 mL/min (ref >60)
Glucose, Bld: 98 mg/dL (ref 70–99)
Potassium: 4.1 mmol/L (ref 3.5–5.1)
Sodium: 139 mmol/L (ref 135–145)

## 2024-08-15 LAB — CBC
HCT: 40.6 % (ref 36.0–46.0)
Hemoglobin: 12.9 g/dL (ref 12.0–15.0)
MCH: 26 pg (ref 26.0–34.0)
MCHC: 31.8 g/dL (ref 30.0–36.0)
MCV: 81.9 fL (ref 80.0–100.0)
Platelets: 299 K/uL (ref 150–400)
RBC: 4.96 MIL/uL (ref 3.87–5.11)
RDW: 14.6 % (ref 11.5–15.5)
WBC: 14.2 K/uL — ABNORMAL HIGH (ref 4.0–10.5)
nRBC: 0 % (ref 0.0–0.2)

## 2024-08-15 MED ORDER — INFLUENZA VAC SPLIT HIGH-DOSE 0.5 ML IM SUSY
0.5000 mL | PREFILLED_SYRINGE | INTRAMUSCULAR | Status: AC
  Administered 2024-08-16: 0.5 mL via INTRAMUSCULAR
  Filled 2024-08-15: qty 0.5

## 2024-08-15 NOTE — Progress Notes (Signed)
 SATURATION QUALIFICATIONS: (This note is used to comply with regulatory documentation for home oxygen)  Patient Saturations on Room Air at Rest = 97%  Patient Saturations on Room Air while Ambulating = 93%   Please briefly explain why patient needs home oxygen:  Patient was able to successfully ambulate around 2W nursing unit with rolling walker as standby, a total continuous ambulation distance of 200 feet was observed. Patient resting in chair at 97% on Room Air prior to assessment, lowest oxygen saturation during ambulation with clear waveform assessed at 93% (two stops to ensure clarity of reading). Patient denies SOB and chest discomfort on return to room, placed in chair per request with call bell within reach. Patient does not appear to require supplemental oxygen at this time.

## 2024-08-15 NOTE — Progress Notes (Addendum)
 Progress Note   Patient: Veronica Hansen FMW:[id number] DOB: [birth year] DOA: 08/08/2024     6 DOS: the patient was seen and examined on 08/15/2024   Brief hospital course: Veronica Hansen is a 78 y.o. female with past medical history of atrial flutter status post ablation, obesity status post stapling, obstructive sleep apnea on CPAP, aortic aneurysm presented to hospital with shortness of breath cough.  Of note patient was recently diagnosed of COVID infection on 08/04/2024 and received 5-day course of Paxlovid.  Patient however continued to have shortness of breath even with minimal exertion and presented to the hospital.  She was noted to be hypoxic with pulse ox in the mid 80s on room air.  Labs showed mild leukocytosis at 13.8.  proBNP 93.  Patient tested positive for COVID by PCR.  Procalcitonin was less than 0.10.  CT angiogram of the chest was negative for PE but left-sided infiltrate suggestive pneumonia.  Patient was then considered for admission to hospital for further evaluation and treatment.    Assessment/Plan  Acute hypoxic respiratory failure secondary to bacterial infection superimposed on COVID-19 pneumonia - resolved Initial pulse ox in the 80s. CRP elevated at 28.7 with ESR of 54. - Completed therapy with Azithromycin , Rocephin , and Solu-Medrol  - Patient maintaining an SpO2 > 90% on room air at rest - Discontinued Budesonide  nebs BID  - Maintained SpO2 > 93% on ambulatory SpO2 with distance of 200 ft (see progress note by RN for full details) - Patient to be monitored overnight for reassurance, ready for discharge on 10/1  - Ambulatory referral to pulmonology   COVID-19 viral infection - Initially diagnosed 08/04/2024 - Plan to continue isolation until 08/14/2024. - Supportive care  Leukocytosis - WBC 18.9 - Remains afebrile and overall significantly improved clinically. Therefore, more likely secondary to steroid use versus secondary to underlying bacterial  pneumonia  Borderline hypotension - resolved Primary hypertension - BP elevated to 140s - Home regimen includes amlodipine  5 mg and olmesartan 40 mg - Resumed home amlodipine  5 mg and olmesartan 40 mg daily  ?COPD Former smoking history - Patient reported completing PFTs previously. On review of records, patient was noted to have completed PFTs in 11/2022 with results showing possible minimal obstruction with overinflation and normal diffusion - Not on home O2 or inhalers - Smoking 2 ppd for 20 years, stopped 25 years ago  Deconditioning debility. - PT OT following  Obstructive sleep apnea.   - Continue CPAP at nighttime  Class III obesity - Body mass index is 43.26 kg/m.  - Patient would benefit from ongoing weight loss as outpatient.       Subjective: Patient seen sitting in chair.  She is doing extremely well.  Able to maintain conversation without any drops in her SpO2 under 90%.  Discussed possibility of discharge today but preference was for overnight monitoring for reassurance.  Denies chest pain, shortness of breath, abdominal pain, nausea, vomiting.  Physical Exam: Vitals:   08/14/24 2100 08/15/24 0748 08/15/24 0754 08/15/24 0947  BP: (!) 97/52 (P) 122/82  120/71  Pulse: 78 (P) 69  90  Resp: 20 (P) 20  18  Temp: 98.1 F (36.7 C)   98.4 F (36.9 C)  TempSrc: Oral   Oral  SpO2: 93% (P) 93% 94% 96%  Weight:      Height:       Physical Exam Vitals reviewed.  Constitutional:      General: She is not in acute distress.    Appearance:  She is not ill-appearing.  Cardiovascular:     Rate and Rhythm: Normal rate and regular rhythm.  Pulmonary:     Effort: Pulmonary effort is normal.     Breath sounds: Normal breath sounds. No wheezing.  Abdominal:     Palpations: Abdomen is soft.     Tenderness: There is no abdominal tenderness.  Musculoskeletal:     Right lower leg: 1+ Edema present.     Left lower leg: 1+ Edema present.  Skin:    General: Skin is warm.      Capillary Refill: Capillary refill takes less than 2 seconds.  Neurological:     Mental Status: She is alert and oriented to person, place, and time.     Family Communication: Patient's daughter at bedside  Disposition: Patient showing significant clinical improvement with near complete return to baseline.  Anticipated discharge on 10/1. Status is: Inpatient Remains inpatient appropriate because: increased O2 requirement, shortness of breath  Planned Discharge Destination: Home    Time spent: 45 minutes  Author: Duffy Larch, MD 08/15/2024 2:09 PM  For on call review [email or web address].

## 2024-08-15 NOTE — Plan of Care (Signed)

## 2024-08-15 NOTE — Progress Notes (Signed)
 Mobility Specialist Progress Note:   08/15/24 0950  Mobility  Activity Ambulated with assistance  Level of Assistance Standby assist, set-up cues, supervision of patient - no hands on  Distance Ambulated (ft) 300 ft  Activity Response Tolerated well  Mobility Referral Yes  Mobility visit 1 Mobility  Mobility Specialist Start Time (ACUTE ONLY) 0950  Mobility Specialist Stop Time (ACUTE ONLY) 1006  Mobility Specialist Time Calculation (min) (ACUTE ONLY) 16 min   Pt agreeable to mobility session. Required only supervision for safety, no physical assistance. Pt with only mild SOB throughout ambulation on RA, SpO2 90s. Back in chair with all needs met.  Therisa Rana Mobility Specialist Please contact via SecureChat or  Rehab office at [phone]

## 2024-08-16 LAB — CBC
HCT: 38.5 % (ref 36.0–46.0)
Hemoglobin: 12.1 g/dL (ref 12.0–15.0)
MCH: 25.7 pg — ABNORMAL LOW (ref 26.0–34.0)
MCHC: 31.4 g/dL (ref 30.0–36.0)
MCV: 81.9 fL (ref 80.0–100.0)
Platelets: 275 K/uL (ref 150–400)
RBC: 4.7 MIL/uL (ref 3.87–5.11)
RDW: 14.6 % (ref 11.5–15.5)
WBC: 12.5 K/uL — ABNORMAL HIGH (ref 4.0–10.5)
nRBC: 0 % (ref 0.0–0.2)

## 2024-08-16 LAB — BASIC METABOLIC PANEL WITH GFR
Anion gap: 11 (ref 5–15)
BUN: 23 mg/dL (ref 8–23)
CO2: 25 mmol/L (ref 22–32)
Calcium: 8.8 mg/dL — ABNORMAL LOW (ref 8.9–10.3)
Chloride: 100 mmol/L (ref 98–111)
Creatinine, Ser: 0.7 mg/dL (ref 0.44–1.00)
GFR, Estimated: >60 mL/min (ref >60)
Glucose, Bld: 110 mg/dL — ABNORMAL HIGH (ref 70–99)
Potassium: 4.1 mmol/L (ref 3.5–5.1)
Sodium: 136 mmol/L (ref 135–145)

## 2024-08-16 MED ORDER — ALBUTEROL SULFATE HFA 108 (90 BASE) MCG/ACT IN AERS
2.0000 | INHALATION_SPRAY | Freq: Four times a day (QID) | RESPIRATORY_TRACT | 2 refills | Status: DC | PRN
Start: 2024-08-16 — End: 2024-08-30

## 2024-08-16 MED ORDER — BENZONATATE 100 MG PO CAPS: 100.0000 mg | ORAL_CAPSULE | Freq: Three times a day (TID) | ORAL | 0 refills | Status: AC | PRN

## 2024-08-16 NOTE — Discharge Summary (Signed)
 Physician Discharge Summary   Patient: Veronica Hansen MRN: [id number] DOB: 1946-10-08  Admit date:     08/08/2024  Discharge date: 08/16/24  Discharge Physician: Carliss LELON Canales   PCP: Yolande Toribio [name], MD   Recommendations at discharge:    Pt to be discharged home.   If you experience worsening fever, chills, chest pain, shortness of breath, or other concerning symptoms, please call your PCP or go to the emergency department immediately.  Discharge Diagnoses: Principal Problem:   Acute respiratory failure with hypoxia (HCC)  Resolved Problems:   * No resolved hospital problems. *   Hospital Course:  Veronica Hansen is a 78 y.o. female with past medical history of atrial flutter status post ablation, obesity status post stapling, obstructive sleep apnea on CPAP, aortic aneurysm presented to hospital with shortness of breath cough.  Of note patient was recently diagnosed of COVID infection on 08/04/2024 and received 5-day course of Paxlovid.  Patient however continued to have shortness of breath even with minimal exertion and presented to the hospital.  She was noted to be hypoxic with pulse ox in the mid 80s on room air.  Labs showed mild leukocytosis at 13.8.  proBNP 93.  Patient tested positive for COVID by PCR.  Procalcitonin was less than 0.10.  CT angiogram of the chest was negative for PE but left-sided infiltrate suggestive pneumonia.  Patient was then considered for admission to hospital for further evaluation and treatment.    Assessment and Plan:  Acute hypoxic respiratory failure secondary to bacterial infection superimposed on COVID-19 pneumonia - resolved Initial pulse ox in the 80s. CRP elevated at 28.7 with ESR of 54. - Completed therapy with Azithromycin , Rocephin , and Solu-Medrol  - Patient maintaining an SpO2 > 90% on room air at rest - Discontinued Budesonide  nebs BID  - Maintained SpO2 > 93% on ambulatory SpO2 with distance of 200 ft (see progress note by  RN for full details) - ready for discharge on 10/1  - Ambulatory referral to pulmonology   COVID-19 viral infection - Initially diagnosed 08/04/2024 - isolation until 08/14/2024.    Borderline hypotension - resolved Primary hypertension - BP elevated to 140s - Home regimen includes amlodipine  5 mg and olmesartan 40 mg - Resumed home amlodipine  5 mg and olmesartan 40 mg daily   ?COPD Former smoking history - Patient reported completing PFTs previously. On review of records, patient was noted to have completed PFTs in 11/2022 with results showing possible minimal obstruction with overinflation and normal diffusion - Not on home O2 or inhalers - Smoking 2 ppd for 20 years, stopped 25 years ago - albuterol  provided on DC.    Obstructive sleep apnea.   - Continue CPAP at nighttime   Class III obesity - Body mass index is 43.26 kg/m.  - Patient would benefit from ongoing weight loss as outpatient.    Consultants: none Procedures performed: none  Disposition: Home Diet recommendation:  Discharge Diet Orders (From admission, onward)     Start     Ordered   08/16/24 0000  Diet - low sodium heart healthy        08/16/24 0850           Cardiac diet  DISCHARGE MEDICATION: Allergies as of 08/16/2024       Reactions   Clindamycin/lincomycin Itching   Orlistat Diarrhea   Other reaction(s): severe diarrhea   Phentermine Hcl    Other reaction(s): elevated BP        Medication List  TAKE these medications    albuterol  108 (90 Base) MCG/ACT inhaler Commonly known as: VENTOLIN  HFA Inhale 2 puffs into the lungs every 6 (six) hours as needed for wheezing or shortness of breath.   amLODipine  5 MG tablet Commonly known as: NORVASC  TAKE 1 TABLET BY MOUTH DAILY   benzonatate  100 MG capsule Commonly known as: TESSALON  Take 1 capsule (100 mg total) by mouth 3 (three) times daily as needed for cough.   escitalopram  10 MG tablet Commonly known as: LEXAPRO  Take 10 mg  by mouth daily.   ezetimibe  10 MG tablet Commonly known as: ZETIA  Take 10 mg by mouth daily.   metoprolol  succinate 100 MG 24 hr tablet Commonly known as: TOPROL -XL Take 100 mg by mouth daily.   olmesartan 40 MG tablet Commonly known as: BENICAR Take 40 mg by mouth daily.   omeprazole  40 MG capsule Commonly known as: PRILOSEC Take 1 capsule (40 mg total) by mouth 2 (two) times daily before a meal.   simvastatin  40 MG tablet Commonly known as: ZOCOR  Take 40 mg by mouth daily.   SYSTANE OP Place 1 drop into both eyes as needed (dry eyes).        Follow-up Information     [facility] Follow up.   Specialty: Home Health Services Why: [facility] is new name- Agency will cal you to set up apt times Contact information: [address] [place] [address] [phone]                 Discharge Exam: Filed Weights   08/08/24 2045  Weight: 121.6 kg    GENERAL:  Alert, pleasant, no acute distress  HEENT:  EOMI CARDIOVASCULAR:  RRR, no murmurs appreciated RESPIRATORY: Poor air movement bilaterally, no wheezing GASTROINTESTINAL:  Soft, nontender, nondistended EXTREMITIES:  No LE edema bilaterally NEURO:  No new focal deficits appreciated SKIN:  No rashes noted PSYCH:  Appropriate mood and affect     Condition at discharge: improving  The results of significant diagnostics from this hospitalization (including imaging, microbiology, ancillary and laboratory) are listed below for reference.   Imaging Studies: CT Angio Chest PE W and/or Wo Contrast Result Date: 08/08/2024 CLINICAL DATA:  Pulmonary embolism (PE) suspected, high prob EXAM: CT ANGIOGRAPHY CHEST WITH CONTRAST TECHNIQUE: Multidetector CT imaging of the chest was performed using the standard protocol during bolus administration of intravenous contrast. Multiplanar CT image reconstructions and MIPs were obtained to evaluate the vascular anatomy. RADIATION DOSE REDUCTION: This exam was  performed according to the departmental dose-optimization program which includes automated exposure control, adjustment of the mA and/or kV according to patient size and/or use of iterative reconstruction technique. CONTRAST:  OMNIPAQUE  IOHEXOL  350 MG/ML SOLN COMPARISON:  03/16/2024 FINDINGS: Pulmonary Embolism: No pulmonary embolism. Cardiovascular: No cardiomegaly or pericardial effusion.Normal variant 2 vessel aortic arch anatomy with the left common carotid arising from the brachiocephalic artery.Similar fusiform dilation of the ascending aorta, measuring 4.3 cm. Diffuse aortic and dense multi-vessel coronary atherosclerosis. Mediastinum/Nodes: No mediastinal mass.Small hiatal hernia.Interval enlargement of the bilateral hilar lymph nodes. For example, there is a 1.2 cm right suprahilar lymph node (axial 65). 1.6 cm left hilar lymph node (axial 73). Lungs/Pleura: The trachea is midline and patent. Diffuse bronchial wall thickening throughout the lungs. Scattered regions of mucous plugging in the right lower lobe bronchi. Obstructive mucous plugging in the left lower lobe bronchus with dense airspace consolidation in the left lower lobe. No pleural effusion or pneumothorax. Musculoskeletal: No acute fracture or destructive  bone lesion. Osteopenia. Thoracic DISH. Multilevel degenerative disc disease of the spine. Upper Abdomen: No acute abnormality in the partially visualized upper abdomen. 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 Cholecystolithiasis. Review of the MIP images confirms the above findings. IMPRESSION: 1. Dense airspace consolidation filling the majority of the left lower lobe with regions of mucous plugging in the bilateral lower lobe bronchi, most likely reflecting pneumonia. No parapneumonic effusion. A 2 view chest radiograph in 6-12 weeks is recommended to document resolution once treatment is complete. 2. Interval enlargement of a couple of bilateral hilar lymph nodes measuring  up to 1.6 cm on the left, likely reactive. 3. 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 4. Cholecystolithiasis. Aortic Atherosclerosis (ICD10-I70.0). Electronically Signed   By: Rogelia Myers M.D.   On: 08/08/2024 15:14   DG Chest 2 View Result Date: 08/08/2024 CLINICAL DATA:  Shortness of breath EXAM: CHEST - 2 VIEW COMPARISON:  CT angio Mar 16, 2024. FINDINGS: Enlarged cardiomediastinal silhouette. Aortic knob is calcified. Increased opacity of posterior segment of left lower lobe suggestive of consolidation/atelectasis. Blunting of left costophrenic angle suggestive of scarring/small effusion. Bilateral vascular congestion. No suspicious osseous lesion. IMPRESSION: Posterior segment of left lower lobe consolidation/atelectasis (query pneumonia). Small pleural effusion on the left can not be excluded. Electronically Signed   By: Megan  Zare M.D.   On: 08/08/2024 13:29    Microbiology: Results for orders placed or performed during the hospital encounter of 08/08/24  Resp panel by RT-PCR (RSV, Flu A&B, Covid) Anterior Nasal Swab     Status: Abnormal   Collection Time: 08/08/24 12:17 PM   Specimen: Anterior Nasal Swab  Result Value Ref Range Status   SARS Coronavirus 2 by RT PCR POSITIVE (A) NEGATIVE Final    Comment: (NOTE) SARS-CoV-2 target nucleic acids are DETECTED.  The SARS-CoV-2 RNA is generally detectable in upper respiratory specimens during the acute phase of infection. Positive results are indicative of the presence of the identified virus, but do not rule out bacterial infection or co-infection with other pathogens not detected by the test. Clinical correlation with patient history and other diagnostic information is necessary to determine patient infection status. The expected result is Negative.  Fact Sheet for Patients: BloggerCourse.com  Fact Sheet for Healthcare Providers: SeriousBroker.it  This  test is not yet approved or cleared by the [place]  FDA and  has been authorized for detection and/or diagnosis of SARS-CoV-2 by FDA under an Emergency Use Authorization (EUA).  This EUA will remain in effect (meaning this test can be used) for the duration of  the COVID-19 declaration under Section 564(b)(1) of the A ct, 21 U.S.C. section 360bbb-3(b)(1), unless the authorization is terminated or revoked sooner.     Influenza A by PCR NEGATIVE NEGATIVE Final   Influenza B by PCR NEGATIVE NEGATIVE Final    Comment: (NOTE) The Xpert Xpress SARS-CoV-2/FLU/RSV plus assay is intended as an aid in the diagnosis of influenza from Nasopharyngeal swab specimens and should not be used as a sole basis for treatment. Nasal washings and aspirates are unacceptable for Xpert Xpress SARS-CoV-2/FLU/RSV testing.  Fact Sheet for Patients: BloggerCourse.com  Fact Sheet for Healthcare Providers: SeriousBroker.it  This test is not yet approved or cleared by the [place]  FDA and has been authorized for detection and/or diagnosis of SARS-CoV-2 by FDA under an Emergency Use Authorization (EUA). This EUA will remain in effect (meaning this test can be used) for the duration of the COVID-19 declaration under Section 564(b)(1) of  the Act, 21 U.S.C. section 360bbb-3(b)(1), unless the authorization is terminated or revoked.     Resp Syncytial Virus by PCR NEGATIVE NEGATIVE Final    Comment: (NOTE) Fact Sheet for Patients: BloggerCourse.com  Fact Sheet for Healthcare Providers: SeriousBroker.it  This test is not yet approved or cleared by the [place]  FDA and has been authorized for detection and/or diagnosis of SARS-CoV-2 by FDA under an Emergency Use Authorization (EUA). This EUA will remain in effect (meaning this test can be used) for the duration of the COVID-19 declaration under  Section 564(b)(1) of the Act, 21 U.S.C. section 360bbb-3(b)(1), unless the authorization is terminated or revoked.  Performed at [facility], [address] [place], [place] [address]   Expectorated Sputum Assessment w Gram Stain, Rflx to Resp Cult     Status: None   Collection Time: 08/10/24 11:12 AM   Specimen: Expectorated Sputum  Result Value Ref Range Status   Specimen Description EXPSU  Final   Special Requests NONE  Final   Sputum evaluation   Final    THIS SPECIMEN IS ACCEPTABLE FOR SPUTUM CULTURE Performed at [facility], [address] [place], [place] [address]    Report Status 08/10/2024 FINAL  Final  Culture, Respiratory w Gram Stain     Status: None   Collection Time: 08/10/24 11:12 AM  Result Value Ref Range Status   Specimen Description EXPSU  Final   Special Requests NONE Reflexed from [id number]  Final   Gram Stain   Final    NO WBC SEEN RARE GRAM POSITIVE COCCI IN PAIRS RARE BUDDING YEAST SEEN RARE GRAM NEGATIVE DIPLOCOCCI    Culture   Final    FEW Normal respiratory flora-no Staph aureus or Pseudomonas seen Performed at [facility], [address], [place], [place] [address]    Report Status 08/13/2024 FINAL  Final    Labs: CBC: Recent Labs  Lab 08/12/24 0825 08/13/24 0451 08/14/24 0808 08/15/24 0449 08/16/24 0159  WBC 19.7* 18.0* 18.9* 14.2* 12.5*  HGB 12.9 12.7 13.9 12.9 12.1  HCT 40.2 39.7 43.2 40.6 38.5  MCV 81.7 81.7 81.5 81.9 81.9  PLT 427* 355 380 299 275   Basic Metabolic Panel: Recent Labs  Lab 08/10/24 0905 08/11/24 1023 08/12/24 0825 08/13/24 0451 08/14/24 0808 08/15/24 0449 08/16/24 0159  NA 135   < > 135 137 139 139 136  K 3.6   < > 4.3 4.5 4.2 4.1 4.1  CL 97*   < > 99 104 101 103 100  CO2 23   < > 24 26 26 26 25   GLUCOSE 189*   < > 105* 116* 92 98 110*  BUN 20   < > 24* 23 19 22 23   CREATININE 0.69   < > 0.67 0.72 0.65 0.61 0.70  CALCIUM 10.0   < > 9.4 9.1 9.5 8.9 8.8*  MG 2.0  --    --   --   --   --   --    < > = values in this interval not displayed.   Liver Function Tests: No results for input(s): AST, ALT, ALKPHOS, BILITOT, PROT, ALBUMIN in the last 168 hours. CBG: Recent Labs  Lab 08/12/24 2200 08/13/24 2134  GLUCAP 182* 185*    Discharge time spent: 28 minutes.  Length of inpatient stay: 7 days  Signed: Carliss LELON Canales, DO [facility] 08/16/2024

## 2024-08-16 NOTE — Care Management Important Message (Signed)
 Important Message  Patient Details  Name: Veronica Hansen MRN: [id number] Date of Birth: Aug 13, 1946   Important Message Given:  Yes - Medicare IM     Claretta Deed 08/16/2024, 11:40 AM

## 2024-08-17 DIAGNOSIS — J449 Chronic obstructive pulmonary disease, unspecified: Secondary | ICD-10-CM | POA: Diagnosis not present

## 2024-08-17 DIAGNOSIS — U071 COVID-19: Secondary | ICD-10-CM | POA: Diagnosis not present

## 2024-08-17 DIAGNOSIS — J9601 Acute respiratory failure with hypoxia: Secondary | ICD-10-CM | POA: Diagnosis not present

## 2024-08-17 DIAGNOSIS — E119 Type 2 diabetes mellitus without complications: Secondary | ICD-10-CM | POA: Diagnosis not present

## 2024-08-17 DIAGNOSIS — G4733 Obstructive sleep apnea (adult) (pediatric): Secondary | ICD-10-CM | POA: Diagnosis not present

## 2024-08-17 DIAGNOSIS — J1282 Pneumonia due to coronavirus disease 2019: Secondary | ICD-10-CM | POA: Diagnosis not present

## 2024-08-17 DIAGNOSIS — Z6841 Body Mass Index (BMI) 40.0 and over, adult: Secondary | ICD-10-CM | POA: Diagnosis not present

## 2024-08-17 DIAGNOSIS — I4892 Unspecified atrial flutter: Secondary | ICD-10-CM | POA: Diagnosis not present

## 2024-08-17 DIAGNOSIS — M109 Gout, unspecified: Secondary | ICD-10-CM | POA: Diagnosis not present

## 2024-08-22 DIAGNOSIS — I480 Paroxysmal atrial fibrillation: Secondary | ICD-10-CM | POA: Diagnosis not present

## 2024-08-22 DIAGNOSIS — G4733 Obstructive sleep apnea (adult) (pediatric): Secondary | ICD-10-CM | POA: Diagnosis not present

## 2024-08-22 DIAGNOSIS — J449 Chronic obstructive pulmonary disease, unspecified: Secondary | ICD-10-CM | POA: Diagnosis not present

## 2024-08-22 DIAGNOSIS — J189 Pneumonia, unspecified organism: Secondary | ICD-10-CM | POA: Diagnosis not present

## 2024-08-22 DIAGNOSIS — K819 Cholecystitis, unspecified: Secondary | ICD-10-CM | POA: Diagnosis not present

## 2024-08-22 DIAGNOSIS — I739 Peripheral vascular disease, unspecified: Secondary | ICD-10-CM | POA: Diagnosis not present

## 2024-08-22 DIAGNOSIS — J9601 Acute respiratory failure with hypoxia: Secondary | ICD-10-CM | POA: Diagnosis not present

## 2024-08-22 DIAGNOSIS — E782 Mixed hyperlipidemia: Secondary | ICD-10-CM | POA: Diagnosis not present

## 2024-08-22 DIAGNOSIS — I10 Essential (primary) hypertension: Secondary | ICD-10-CM | POA: Diagnosis not present

## 2024-08-22 DIAGNOSIS — K862 Cyst of pancreas: Secondary | ICD-10-CM | POA: Diagnosis not present

## 2024-08-22 DIAGNOSIS — U071 COVID-19: Secondary | ICD-10-CM | POA: Diagnosis not present

## 2024-08-29 NOTE — Progress Notes (Incomplete)
 New Patient Pulmonology Office Visit   Subjective:  Patient ID: Veronica Hansen, female    DOB: May 08, 1946  MRN: [id number]  Referred by: Yolande Toribio [name], MD  CC: No chief complaint on file.   HPI Veronica Hansen is a 78 y.o. female without significant pulmonary history who was recently discharged on 08/16/2024 with acute hypoxic respiratory failure secondary to COVID and bacterial pneumonia.  Patient report she has been having shortness of breath for years but it has been stable and she was thought to be related to her weight.  After discharge, her dyspnea has improved however she is not back to normal.  She has significant productive cough. Breztri inhaler was recently prescribed and has not make any difference.  She uses albuterol  inhaler 3 times in the last 2 weeks.  Patient denied any chronic pulmonary conditions.  She has a history of smoking for 10 years, 1.5 PPD and she has quitted in 03-Oct-1999.  In the past she has used breztri for 6 months, without any improvement of h on her dyspnea.  She has a  ROS as above  Allergies: Clindamycin/lincomycin, Orlistat, and Phentermine hcl  Current Outpatient Medications:    albuterol  (VENTOLIN  HFA) 108 (90 Base) MCG/ACT inhaler, Inhale 2 puffs into the lungs every 6 (six) hours as needed for wheezing or shortness of breath., Disp: 8 g, Rfl: 2   amLODipine  (NORVASC ) 5 MG tablet, TAKE 1 TABLET BY MOUTH DAILY, Disp: 90 tablet, Rfl: 3   benzonatate  (TESSALON ) 100 MG capsule, Take 1 capsule (100 mg total) by mouth 3 (three) times daily as needed for cough., Disp: 20 capsule, Rfl: 0   escitalopram  (LEXAPRO ) 10 MG tablet, Take 10 mg by mouth daily., Disp: , Rfl:    ezetimibe  (ZETIA ) 10 MG tablet, Take 10 mg by mouth daily., Disp: , Rfl:    metoprolol  succinate (TOPROL -XL) 100 MG 24 hr tablet, Take 100 mg by mouth daily., Disp: , Rfl:    olmesartan (BENICAR) 40 MG tablet, Take 40 mg by mouth daily., Disp: , Rfl:    omeprazole  (PRILOSEC) 40 MG  capsule, Take 1 capsule (40 mg total) by mouth 2 (two) times daily before a meal., Disp: 60 capsule, Rfl: 12   Polyethyl Glycol-Propyl Glycol (SYSTANE OP), Place 1 drop into both eyes as needed (dry eyes)., Disp: , Rfl:    simvastatin  (ZOCOR ) 40 MG tablet, Take 40 mg by mouth daily., Disp: , Rfl:  Past Medical History:  Diagnosis Date   A-fib (HCC)    A-fib (HCC) 07/2018   treated with ablation procedure in 2018/10/02  Allergic rhinitis    Anemia    Arthritis    joints hurt   ASCVD (arteriosclerotic cardiovascular disease)    Atypical chest pain    cardiology consult showed an echocardiogram with left ventricular hypertrophy normal left ventricular function.   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   COPD (chronic obstructive pulmonary disease) (HCC)    Depression    husband died of Covid 10-02-20   Diabetes mellitus    type 2, no longer takes medication    Diabetes mellitus (HCC)    Dyspnea    GERD (gastroesophageal reflux disease)    History of blood transfusion    HTN (hypertension)    Hypercholesterolemia    Hyperlipidemia    Hypertension    Incontinence    Iron deficiency anemia    Lower esophageal sphincter, relaxation    Morbid obesity (HCC)  OSA (obstructive sleep apnea)    OSA on CPAP 03/07/2014   OSA on CPAP    Proteinuria 2001   Psoriasis    RLL pneumonia 06/2016   Runny nose    seasonal - poss allergies   Sinus problem    SOB (shortness of breath)    Tobacco use    Stopped in 2004   Past Surgical History:  Procedure Laterality Date   A-FLUTTER ABLATION N/A 08/19/2018   Procedure: A-FLUTTER ABLATION;  Surgeon: Waddell Danelle ORN, MD;  Location: [facility]ASIVE [facility];  Service: Cardiovascular;  Laterality: N/A;   ATRIAL FLUTTER ABLATION     BARIATRIC SURGERY  2013   dental implants  11/16/2009   ERCP N/A 06/26/2024   Procedure: ERCP, WITH INTERVENTION IF INDICATED;  Surgeon: Charlanne Groom, MD;  Location: [facility] ENDOSCOPY;  Service: Gastroenterology;  Laterality: N/A;    ESOPHAGOGASTRODUODENOSCOPY N/A 04/24/2024   Procedure: EGD (ESOPHAGOGASTRODUODENOSCOPY);  Surgeon: Wilhelmenia Aloha Raddle., MD;  Location: [facility];  Service: Gastroenterology;  Laterality: N/A;   EUS N/A 04/24/2024   Procedure: ULTRASOUND, UPPER GI TRACT, ENDOSCOPIC;  Surgeon: Wilhelmenia Aloha Raddle., MD;  Location: [facility] ENDOSCOPY;  Service: Gastroenterology;  Laterality: N/A;   FINE NEEDLE ASPIRATION  04/24/2024   Procedure: FINE NEEDLE ASPIRATION;  Surgeon: Wilhelmenia, Aloha Raddle., MD;  Location: [facility];  Service: Gastroenterology;;   IR EMBO TUMOR ORGAN ISCHEMIA INFARCT INC GUIDE ROADMAPPING  01/21/2024   IR RADIOLOGIST EVAL & MGMT  12/27/2023   IR RENAL SUPRASEL UNI S&I MOD SED  01/21/2024   IR RENAL SUPRASEL UNI S&I MOD SED  01/21/2024   IR US  GUIDE VASC ACCESS RIGHT  01/21/2024   LARYNX SURGERY     polyp removed   left breast bx was fibrocystic change     left cataract extraction and lens implantation  01/11/2019   ORIF ULNAR FRACTURE Right 02/15/2021   Procedure: OPEN REDUCTION INTERNAL FIXATION (ORIF) ULNAR FRACTURE and distal radius open reduction and internal fixation and repair;  Surgeon: Shari Easter, MD;  Location: [facility] OR;  Service: Orthopedics;  Laterality: Right;  with IV sedation   Right TKR  2007   TOTAL HIP ARTHROPLASTY Right 06/03/2023   Procedure: TOTAL HIP ARTHROPLASTY ANTERIOR APPROACH;  Surgeon: Ernie Cough, MD;  Location: [facility] ORS;  Service: Orthopedics;  Laterality: Right;   TOTAL KNEE ARTHROPLASTY  11/17/2003   left   TOTAL KNEE ARTHROPLASTY  11/16/2005   right   Family History  Problem Relation Age of Onset   Diabetes Father    Diabetes Brother    Colon cancer Neg Hx    Rectal cancer Neg Hx    Stomach cancer Neg Hx    Social History   Socioeconomic History   Marital status: Widowed    Spouse name: Not on file   Number of children: 3   Years of education: Not on file   Highest education level: Bachelor's degree (e.g., BA, AB, BS)  Occupational History    Occupation: Retired  Tobacco Use   Smoking status: Former    Current packs/day: 0.00    Types: Cigarettes    Start date: 08/19/1980    Quit date: 08/19/2000    Years since quitting: 24.0   Smokeless tobacco: Never  Vaping Use   Vaping status: Never Used  Substance and Sexual Activity   Alcohol  use: No    Comment: Rare   Drug use: No   Sexual activity: Not Currently    Comment: Quit ~ 2007  Other Topics Concern  Not on file  Social History Narrative   Not on file   Social Drivers of Health   Financial Resource Strain: Not on file  Food Insecurity: No Food Insecurity (08/08/2024)   Hunger Vital Sign    Worried About Running Out of Food in the Last Year: Never true    Ran Out of Food in the Last Year: Never true  Transportation Needs: No Transportation Needs (08/08/2024)   PRAPARE - Administrator, Civil Service (Medical): No    Lack of Transportation (Non-Medical): No  Physical Activity: Not on file  Stress: Not on file  Social Connections: Moderately Integrated (08/08/2024)   Social Connection and Isolation Panel    Frequency of Communication with Friends and Family: Three times a week    Frequency of Social Gatherings with Friends and Family: More than three times a week    Attends Religious Services: More than 4 times per year    Active Member of Clubs or Organizations: Yes    Attends Banker Meetings: More than 4 times per year    Marital Status: Widowed  Intimate Partner Violence: Not At Risk (08/08/2024)   Humiliation, Afraid, Rape, and Kick questionnaire    Fear of Current or Ex-Partner: No    Emotionally Abused: No    Physically Abused: No    Sexually Abused: No       Objective:  There were no vitals taken for this visit.   Physical Exam Constitutional:      Appearance: Normal appearance.  HENT:     Head: Normocephalic.     Nose: Nose normal.  Eyes:     Extraocular Movements: Extraocular movements intact.     Pupils: Pupils are  equal, round, and reactive to light.  Cardiovascular:     Rate and Rhythm: Normal rate and regular rhythm.  Pulmonary:     Effort: Pulmonary effort is normal.     Breath sounds: Normal breath sounds.     Comments: Some decreased lung sounds.  Musculoskeletal:        General: Normal range of motion.  Neurological:     General: No focal deficit present.     Mental Status: She is alert and oriented to person, place, and time. Mental status is at baseline.     Diagnostic Review:   CT chest 08/08/2024 1. Dense airspace consolidation filling the majority of the left lower lobe with regions of mucous plugging in the bilateral lower lobe bronchi, most likely reflecting pneumonia. No parapneumonic effusion. A 2 view chest radiograph in 6-12 weeks is recommended to document resolution once treatment is complete. 2. Interval enlargement of a couple of bilateral hilar lymph nodes measuring up to 1.6 cm on the left, likely reactive. 3. 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 4. Cholecystolithiasis.  PFTs  11/2022 [facility]    3/95% FEV1  2/87% F/F       69% TLC   6.7/123% RV     3.4/142% DLCO 17.45/82%  11/2022: Minimal obtruction with overinflation, normal diffusion. No significant response to BD.    Assessment & Plan:   Assessment & Plan Mucopurulent chronic bronchitis (HCC) 78 years old who was recently admitted for acute hypoxic respiratory failure secondary to bacterial infection and COVID-pneumonia.   She has been already treated with 7 to 8 days of antibiotics. After discharge her main issue has been productive cough.  She feels Lorraine is not making any difference in her symptoms.  She has  chronic dyspnea exertion stable for years.  Her CT scan shows multiple mucous plugging airways.  For chronic bronchitis: -Albuterol  twice a day for 2 weeks -Mucinex Orders:   Flutter valve; Future   For home use only DME Nebulizer machine CT chest is already ordered in  November, I will review images in the next appt. I highly encouraged to continue to exercise daily.  Mild obstruction on PFTs Remote history of smoking Chronic Dyspnea on exertion  Patient denied any history of COPD in the past.  Denied any flares or frequent cough with mucus production.  Her PFT for last year did show minimal obstruction with overinflation, without significant response to bronchodilator. She feels Lorraine is not working for her and it does not make any difference in her symptoms.  We will continue to follow-up if she needs a LAMA/LABA.   Return in about 2 months (around 10/30/2024).    Marny Patch, MD Pulmonary and Critical Care Medicine [facility],

## 2024-08-30 ENCOUNTER — Ambulatory Visit (INDEPENDENT_AMBULATORY_CARE_PROVIDER_SITE_OTHER)

## 2024-08-30 VITALS — BP 136/82 | HR 60 | Temp 97.5°F | Ht 67.0 in | Wt 271.2 lb

## 2024-08-30 DIAGNOSIS — J411 Mucopurulent chronic bronchitis: Secondary | ICD-10-CM | POA: Diagnosis not present

## 2024-08-30 DIAGNOSIS — R0609 Other forms of dyspnea: Secondary | ICD-10-CM

## 2024-08-30 MED ORDER — ALBUTEROL SULFATE (2.5 MG/3ML) 0.083% IN NEBU: 2.5000 mg | INHALATION_SOLUTION | Freq: Four times a day (QID) | RESPIRATORY_TRACT | 12 refills | Status: AC | PRN

## 2024-08-30 MED ORDER — ALBUTEROL SULFATE HFA 108 (90 BASE) MCG/ACT IN AERS: 2.0000 | INHALATION_SPRAY | Freq: Four times a day (QID) | RESPIRATORY_TRACT | 2 refills | Status: DC | PRN

## 2024-08-30 MED ORDER — GUAIFENESIN ER 600 MG PO TB12: 600.0000 mg | ORAL_TABLET | Freq: Two times a day (BID) | ORAL | 0 refills | Status: AC

## 2024-08-30 NOTE — Patient Instructions (Addendum)
 Dear Ms. Veronica Hansen,  You have bronchitis so I recommend the following: - Nebulizer albuterol  twice a day for 2 weeks. -Use the flutter device after each nebulizer, and then every 2 hours for 2-3 weeks. -Use the mucinex twice a day to break the mucus. -I highly recommend you to exercise every day to help with the secretions.  I will see you in 2 months.   Acapella device instructions: How to Use the Acapella or Flutter valve Assure proper setting of the dial on the end of the Acapella. This is the end opposite the mouthpiece. Your health care provider will set the dial when you get your Acapella. Rotate the end toward the + (plus) to increase resistance. Rotate the end toward -  (minus) to decrease resistance. Sit up with good posture to use the Acapella. Sometimes we may have you lie in a postural drainage position to use the Acapella. Postural drainage positions include lying flat on your back, flat on your side and alternating sides. Your health care provider will show you postural drainage positions to use if these are recommended. Take in a fairly deep breath and hold it for about 3 seconds. Place the Acapella mouthpiece in your mouth. Seal your lips tightly around the mouthpiece. Exhale as much as possible (but not to forcefully) through the mouthpiece. Keep your cheeks as firm as possible when you exhale. Try not to inhale through the device. Repeat this maneuver for 10 breaths. Try to resist coughing during this phase. After these 10 blows, perform 3 huffs, then a big cough to bring the sputum up and out. Try not to swallow the mucus. Huff coughing is a type of coughing if you have trouble clearing your mucus. Take a breath that is slightly deeper than normal. Use your stomach muscles to make a series of 3 rapid exhalations with the airway open, making a "ha, ha, ha" sound. Follow this by normal breathing and a deep cough if you feel mucus moving. Repeat steps 1 through 7 for 15 minutes 2 to 4  times a day (or as prescribed by your health care provider). Your health care provider will tell you how many times a day to use the Acapella. If you use a short-acting inhaled bronchodilator, use the Acapella 15 minutes after you inhale the medicine.   How to Clean and Care for the Acapella (green or blue) Wash your hands. Clean the Acapella every day. Take the mouthpiece off the Acapella body. The mouthpiece and body need to be washed.   Daily - Clean the mouthpiece and body of the Acapella daily with liquid dish detergent - Rinse with water . Disinfect Weekly - Clean the mouthpiece and body with alcohol  - soak both parts in 70 percent isopropyl alcohol  for 5 minutes. OR Clean mouthpiece and body with hydrogen peroxide - soak both parts in 3 percent hydrogen peroxide for 30 minutes.   Note: The Acapella should not be placed in the automatic dishwasher, boiled or bleached. Rinse in clean water . Shake off excess water . Drain dry the device. Place each piece downward or rest the unit on its side. Replace the mouthpiece when the unit is completely dry and ready for use.

## 2024-09-01 ENCOUNTER — Encounter: Payer: Self-pay | Admitting: Gastroenterology

## 2024-09-04 ENCOUNTER — Ambulatory Visit
Admission: RE | Admit: 2024-09-04 | Discharge: 2024-09-04 | Disposition: A | Discharge status: Home / Self Care | Source: Ambulatory Visit | Attending: Interventional Radiology | Admitting: Interventional Radiology

## 2024-09-04 DIAGNOSIS — D1771 Benign lipomatous neoplasm of kidney: Secondary | ICD-10-CM | POA: Diagnosis not present

## 2024-09-04 MED ORDER — GADOPICLENOL 0.5 MMOL/ML IV SOLN
10.0000 mL | Freq: Once | INTRAVENOUS | Status: AC | PRN
  Administered 2024-09-04: 10 mL via INTRAVENOUS

## 2024-09-11 NOTE — Progress Notes (Signed)
 Surgical Instructions   Your procedure is scheduled on Wednesday, November 5th. Report to [facility] Main Entrance A at 7:00 A.M., then check in with the Admitting office. Any questions or running late day of surgery: call [phone]  Questions prior to your surgery date: call [phone], Monday-Friday, 8am-4pm. If you experience any cold or flu symptoms such as cough, fever, chills, shortness of breath, etc. between now and your scheduled surgery, please notify us  at the above number.     Remember:  Do not eat after midnight the night before your surgery   You may drink clear liquids until 6:00 the morning of your surgery.   Clear liquids allowed are: Water , Non-Citrus Juices (without pulp), Carbonated Beverages, Clear Tea (no milk, honey, etc.), Black Coffee Only (NO MILK, CREAM OR POWDERED CREAMER of any kind), and Gatorade.    Take these medicines the morning of surgery with A SIP OF WATER   amLODipine  (NORVASC )  escitalopram  (LEXAPRO )  ezetimibe  (ZETIA )  metoprolol  succinate (TOPROL -XL)  omeprazole  (PRILOSEC)  simvastatin  (ZOCOR )   May take these medicines IF NEEDED: albuterol  (PROVENTIL ) nebulizer and inhaler - bring inhaler with on day of surgery  benzonatate  (TESSALON )     One week prior to surgery, STOP taking any Aspirin  (unless otherwise instructed by your surgeon) Aleve, Naproxen, Ibuprofen, Motrin, Advil, Goody's, BC's, all herbal medications, fish oil, and non-prescription vitamins.                     Do NOT Smoke (Tobacco/Vaping) for 24 hours prior to your procedure.  If you use a CPAP at night, you may bring your mask/headgear for your overnight stay.   You will be asked to remove any contacts, glasses, piercing's, hearing aid's, dentures/partials prior to surgery. Please bring cases for these items if needed.    Patients discharged the day of surgery will not be allowed to drive home, and someone needs to stay with them for 24 hours.  SURGICAL  WAITING ROOM VISITATION Patients may have no more than 2 support people in the waiting area - these visitors may rotate.   Pre-op nurse will coordinate an appropriate time for 1 ADULT support person, who may not rotate, to accompany patient in pre-op.  Children under the age of 81 must have an adult with them who is not the patient and must remain in the main waiting area with an adult.  If the patient needs to stay at the hospital during part of their recovery, the visitor guidelines for inpatient rooms apply.  Please refer to the [facility] website for the visitor guidelines for any additional information.   If you received a COVID test during your pre-op visit  it is requested that you wear a mask when out in public, stay away from anyone that may not be feeling well and notify your surgeon if you develop symptoms. If you have been in contact with anyone that has tested positive in the last 10 days please notify you surgeon.      Pre-operative CHG Bathing Instructions   You can play a key role in reducing the risk of infection after surgery. Your skin needs to be as free of germs as possible. You can reduce the number of germs on your skin by washing with CHG (chlorhexidine  gluconate) soap before surgery. CHG is an antiseptic soap that kills germs and continues to kill germs even after washing.   DO NOT use if you have an allergy to chlorhexidine /CHG or antibacterial soaps. If  your skin becomes reddened or irritated, stop using the CHG and notify one of our RNs at [phone].              TAKE A SHOWER THE NIGHT BEFORE SURGERY   Please keep in mind the following:  DO NOT shave, including legs and underarms, 48 hours prior to surgery.   Place clean sheets on your bed the night before surgery Use a clean washcloth (not used since being washed) for shower. DO NOT sleep with pet's night before surgery.  CHG Shower Instructions:  Wash your face and private area with normal soap. If you  choose to wash your hair, wash first with your normal shampoo.  After you use shampoo/soap, rinse your hair and body thoroughly to remove shampoo/soap residue.  Turn the water  OFF and apply half the bottle of CHG soap to a CLEAN washcloth.  Apply CHG soap ONLY FROM YOUR NECK DOWN TO YOUR TOES (washing for 3-5 minutes)  DO NOT use CHG soap on face, private areas, open wounds, or sores.  Pay special attention to the area where your surgery is being performed.  If you are having back surgery, having someone wash your back for you may be helpful. Wait 2 minutes after CHG soap is applied, then you may rinse off the CHG soap.  Pat dry with a clean towel  Put on clean pajamas    Additional instructions for the day of surgery: If you choose, you may shower the morning of surgery with an antibacterial soap.  DO NOT APPLY any lotions, deodorants, or perfumes.   Do not wear jewelry or makeup Do not wear nail polish, gel polish, artificial nails, or any other type of covering on natural nails (fingers and toes) Do not bring valuables to the hospital. [facility] is not responsible for valuables/personal belongings. Put on clean/comfortable clothes.  Please brush your teeth.  Ask your nurse before applying any prescription medications to the skin.

## 2024-09-12 ENCOUNTER — Encounter (HOSPITAL_COMMUNITY): Payer: Self-pay

## 2024-09-12 ENCOUNTER — Encounter (HOSPITAL_COMMUNITY)
Admission: RE | Admit: 2024-09-12 | Discharge: 2024-09-12 | Disposition: A | Discharge status: Home / Self Care | Source: Ambulatory Visit | Attending: General Surgery | Admitting: General Surgery

## 2024-09-12 ENCOUNTER — Other Ambulatory Visit: Payer: Self-pay

## 2024-09-12 VITALS — BP 171/89 | HR 71 | Temp 97.9°F | Resp 19 | Ht 67.0 in | Wt 274.0 lb

## 2024-09-12 DIAGNOSIS — Z01818 Encounter for other preprocedural examination: Secondary | ICD-10-CM

## 2024-09-12 DIAGNOSIS — E119 Type 2 diabetes mellitus without complications: Secondary | ICD-10-CM | POA: Insufficient documentation

## 2024-09-12 DIAGNOSIS — Z01812 Encounter for preprocedural laboratory examination: Secondary | ICD-10-CM | POA: Diagnosis not present

## 2024-09-12 LAB — CBC
HCT: 41.2 % (ref 36.0–46.0)
Hemoglobin: 12.8 g/dL (ref 12.0–15.0)
MCH: 26 pg (ref 26.0–34.0)
MCHC: 31.1 g/dL (ref 30.0–36.0)
MCV: 83.7 fL (ref 80.0–100.0)
Platelets: 207 K/uL (ref 150–400)
RBC: 4.92 MIL/uL (ref 3.87–5.11)
RDW: 15.4 % (ref 11.5–15.5)
WBC: 5.9 K/uL (ref 4.0–10.5)
nRBC: 0 % (ref 0.0–0.2)

## 2024-09-12 LAB — GLUCOSE, CAPILLARY: Glucose-Capillary: 97 mg/dL (ref 70–99)

## 2024-09-12 LAB — BASIC METABOLIC PANEL WITH GFR
Anion gap: 9 (ref 5–15)
BUN: 10 mg/dL (ref 8–23)
CO2: 27 mmol/L (ref 22–32)
Calcium: 9.2 mg/dL (ref 8.9–10.3)
Chloride: 103 mmol/L (ref 98–111)
Creatinine, Ser: 0.55 mg/dL (ref 0.44–1.00)
GFR, Estimated: >60 mL/min (ref >60)
Glucose, Bld: 97 mg/dL (ref 70–99)
Potassium: 3.9 mmol/L (ref 3.5–5.1)
Sodium: 139 mmol/L (ref 135–145)

## 2024-09-12 LAB — HEMOGLOBIN A1C
Hgb A1c MFr Bld: 5.5 % (ref 4.8–5.6)
Mean Plasma Glucose: 111.15 mg/dL

## 2024-09-12 NOTE — Progress Notes (Signed)
 Surgical Instructions   Your procedure is scheduled on Wednesday, November 5th. Report to [facility] Main Entrance A at 7:00 A.M., then check in with the Admitting office. Any questions or running late day of surgery: call [phone]  Questions prior to your surgery date: call [phone], Monday-Friday, 8am-4pm. If you experience any cold or flu symptoms such as cough, fever, chills, shortness of breath, etc. between now and your scheduled surgery, please notify us  at the above number.     Remember:  Do not eat after midnight the night before your surgery. No gum, mints or hard candy   You may drink clear liquids until 6:00 the morning of your surgery.   Clear liquids allowed are: Water , Non-Citrus Juices (without pulp), Carbonated Beverages, Clear Tea (no milk, honey, etc.), Black Coffee Only (NO MILK, CREAM OR POWDERED CREAMER of any kind), and Gatorade.    Take these medicines the morning of surgery with A SIP OF WATER   amLODipine  (NORVASC )  escitalopram  (LEXAPRO )  ezetimibe  (ZETIA )  metoprolol  succinate (TOPROL -XL)  omeprazole  (PRILOSEC)  simvastatin  (ZOCOR )   May take these medicines IF NEEDED: albuterol  (PROVENTIL ) nebulizer and inhaler - bring inhaler with on day of surgery  benzonatate  (TESSALON )     One week prior to surgery, STOP taking any Aspirin  (unless otherwise instructed by your surgeon) Aleve, Naproxen, Ibuprofen, Motrin, Advil, Goody's, BC's, all herbal medications, fish oil, and non-prescription vitamins.                     Do NOT Smoke (Tobacco/Vaping) for 24 hours prior to your procedure.  If you use a CPAP at night, you may bring your mask/headgear for your overnight stay.   You will be asked to remove any contacts, glasses, piercing's, hearing aid's, dentures/partials prior to surgery. Please bring cases for these items if needed.    Patients discharged the day of surgery will not be allowed to drive home, and someone needs to stay with them for  24 hours.  SURGICAL WAITING ROOM VISITATION Patients may have no more than 2 support people in the waiting area - these visitors may rotate.   Pre-op nurse will coordinate an appropriate time for 1 ADULT support person, who may not rotate, to accompany patient in pre-op.  Children under the age of 21 must have an adult with them who is not the patient and must remain in the main waiting area with an adult.  If the patient needs to stay at the hospital during part of their recovery, the visitor guidelines for inpatient rooms apply.  Please refer to the [facility] website for the visitor guidelines for any additional information.   If you received a COVID test during your pre-op visit  it is requested that you wear a mask when out in public, stay away from anyone that may not be feeling well and notify your surgeon if you develop symptoms. If you have been in contact with anyone that has tested positive in the last 10 days please notify you surgeon.      Pre-operative CHG Bathing Instructions   You can play a key role in reducing the risk of infection after surgery. Your skin needs to be as free of germs as possible. You can reduce the number of germs on your skin by washing with CHG (chlorhexidine  gluconate) soap before surgery. CHG is an antiseptic soap that kills germs and continues to kill germs even after washing.   DO NOT use if you have an allergy  to chlorhexidine /CHG or antibacterial soaps. If your skin becomes reddened or irritated, stop using the CHG and notify one of our RNs at [phone].              TAKE A SHOWER THE NIGHT BEFORE SURGERY   Please keep in mind the following:  DO NOT shave, including legs and underarms, 48 hours prior to surgery.   Place clean sheets on your bed the night before surgery Use a clean washcloth (not used since being washed) for shower. DO NOT sleep with pet's night before surgery.  CHG Shower Instructions:  Wash your face and private area  with normal soap. If you choose to wash your hair, wash first with your normal shampoo.  After you use shampoo/soap, rinse your hair and body thoroughly to remove shampoo/soap residue.  Turn the water  OFF and apply half the bottle of CHG soap to a CLEAN washcloth.  Apply CHG soap ONLY FROM YOUR NECK DOWN TO YOUR TOES (washing for 3-5 minutes)  DO NOT use CHG soap on face, private areas, open wounds, or sores.  Pay special attention to the area where your surgery is being performed.  If you are having back surgery, having someone wash your back for you may be helpful. Wait 2 minutes after CHG soap is applied, then you may rinse off the CHG soap.  Pat dry with a clean towel  Put on clean pajamas    Additional instructions for the day of surgery: If you choose, you may shower the morning of surgery with an antibacterial soap.  DO NOT APPLY any lotions, deodorants, or perfumes.   Do not wear jewelry or makeup Do not wear nail polish, gel polish, artificial nails, or any other type of covering on natural nails (fingers and toes) Do not bring valuables to the hospital. [facility] is not responsible for valuables/personal belongings. Put on clean/comfortable clothes.  Please brush your teeth.  Ask your nurse before applying any prescription medications to the skin.

## 2024-09-12 NOTE — Progress Notes (Signed)
 PCP - Toribio Shan Cardiologist - Okey- 07/13/24- clearance in note Pulmonologist- Adrien Guan, [place], MD - pt reports that MD told her verbally that she could go ahead with her surgery. No clearance note in epic.   PPM/ICD - denies    Chest x-ray - 08/08/24 EKG - 08/08/24 Stress Test - denies ECHO - 08/10/18 Cardiac Cath - denies  Sleep Study - 03/07/14- wears CPAP- does not know pressure settings  Fasting Blood Sugar - Pt states she is DM, but does not check blood sugars at home. Pt does not have a way to check blood sugars at home. Pt states he A1c has been under 6.   Last dose of GLP1 agonist-  N/A   Blood Thinner Instructions: N/A Aspirin  Instructions:N/A  ERAS Protcol - ERAS no drink   COVID TEST- N/A- pt with recent admission for pneumonia. COVID positive 08/08/24   Anesthesia review: yes- Lynwood Hope, PA came to see pt at PAT appointment, Pt with cardiac clearance on 07/13/24. No pulmonary clearance noted, but pt states that Dr. Adrien Guan is aware of surgery. Pt states she did not have to get medical clearance from PCP.   Patient denies shortness of breath, fever, cough and chest pain at PAT appointment   All instructions explained to the patient, with a verbal understanding of the material. Patient agrees to go over the instructions while at home for a better understanding. . The opportunity to ask questions was provided.

## 2024-09-13 NOTE — Progress Notes (Signed)
 Anesthesia Chart Review:  78 year old female pertinent history including former smoker (quit 2001), mild COPD, hiatal hernia, GERD on PPI, HTN, atrial flutter s/p ablation 2019, OSA on CPAP, aortic aneurysm, s/p bariatric surgery 2013, class III obesity BMI 43.  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  Patient was seen by cardiologist Dr. Okey on 07/05/2024 for follow-up as well as preop evaluation.  She was noted to be stable from cardiac standpoint.  Per note, Plan for choley Not scheduled yet From a cardiac standpoint I feel she is at low risk for major cardiac event.  Recent admission 9/23 through 08/16/2024 for acute respiratory failure secondary to bacterial infection and COVID-pneumonia.  She was also noted to be in atrial fibrillation on admission with a rate of 105.  Completed therapy with azithromycin , Rocephin , Solu-Medrol .  At discharge she was maintaining O2 sats >93% with ambulation on room air.  She had outpatient pulmonology follow-up with Dr. Adrien Guan on 08/30/2024.  Per note she continued to have productive cough.  CT scan showed multiple mucous plugging airways.  She was recommended to use Mucinex and albuterol  twice a day for 2 weeks for chronic bronchitis.  She was also given a flutter valve.  I evaluated patient in her preadmission testing visit on 09/12/2024.  She reports significant improvement after starting albuterol  nebulizers, flutter valve, and Mucinex.  States her cough resolved within a few days after her pulmonology visit.  Currently, she feels quite well.  Denies any cardiopulmonary complaints.  She has baseline DOE that has been stable for years.  She says she does continue to use albuterol  nebulizer once in the morning but does not feel she really needs it.  She has discontinued Mucinex.  She continues to use flutter valve.  On exam she is well-appearing,  no shortness of breath with conversation.  Lungs are clear bilaterally.  Heart regular rate and rhythm, no murmur.  At time of surgery she will have nearly 3 weeks of complete symptom resolution.  I anticipate that she will be able to proceed as planned barring any acute status change.  I did instruct her to let us  know if she had any recrudescence of her symptoms.  Preop labs reviewed, WNL.  EKG done 08/08/2024 during recent admission notable for new atrial fibrillation.  It does not appear that this was discussed/addressed during admission.  I reached out to patient's cardiologist Dr. Okey for input.  She recommended patient start Eliquis 5 mg twice daily and be seen in cardiology office for repeat EKG.  Repeat EKG 09/15/2024 showed NSR, rate 60, anterior T wave inversions.  I called and spoke with the patient on 09/15/2024 regarding Eliquis and her upcoming surgery.  She stated she was not quite sure what to do with the medication and she had not yet picked it up.  She has some questions regarding the long-term plan.  I did encourage her to reach back out to Dr. Nada office to make sure she had clear instructions.  She understands that if she does start Eliquis, it would need to be held 2 days prior to surgery.  Of note, she also affirmed that she continues to feel well from a respiratory standpoint, denied any symptoms.  EKG 08/08/2024: Atrial fibrillation.  Rate 105.  CTA chest 09/19/2024: IMPRESSION: 1. Near complete resolution of the previously seen consolidation over the posteromedial left lower lobe with minimal residual  linear density over the left lower lobe likely scarring/atelectasis. 2. 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 Ascending thoracic aortic aneurysm. Recommend semi-annual imaging followup by CTA or MRA and referral to cardiothoracic surgery if not already obtained. This recommendation follows 2010 ACCF/AHA/AATS/ACR/ASA/SCA/SCAI/SIR/STS/SVM Guidelines for  the Diagnosis and Management of Patients With Thoracic Aortic Disease. Circulation. 2010; 121: Z733-z630. Aortic aneurysm NOS (ICD10-I71.9). 3. Mild centrilobular emphysematous disease. 4. Mild nodular contour to the liver which could be seen with cirrhosis. 5. Cholelithiasis. 6. Multiple bilateral renal cysts incompletely evaluated on this exam. 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 7. Aortic atherosclerosis. Atherosclerotic coronary artery disease.  Cardiac PET CT 12/08/2022: Perfusion/Defect LV perfusion is normal. There is no evidence of ischemia. There is no evidence of infarction. Ejection Fraction Rest left ventricular function is normal. Rest EF: 59 %. Stress left ventricular function is normal. Stress EF: 65 %. End diastolic cavity size is normal. End systolic cavity size is normal. Myocardial Bood Flow Myocardial blood flow was computed to be 0.28ml/g/min at rest and 2.43ml/g/min at stress. Global myocardial blood flow reserve was 2.34 and was normal. Calcium Scoring Coronary calcium was present on the attenuation correction CT images. Moderate coronary calcifications were present. Coronary calcifications were present in the left anterior descending artery, left circumflex artery and right coronary artery distribution(s). Stress Combined Conclusion The study is normal. The study is low risk.     Lynwood Geofm RIGGERS [facility] Short Stay Center/Anesthesiology Phone [phone] 09/19/2024 3:00 PM

## 2024-09-14 ENCOUNTER — Telehealth: Payer: Self-pay

## 2024-09-14 ENCOUNTER — Other Ambulatory Visit: Payer: Self-pay

## 2024-09-14 DIAGNOSIS — I4892 Unspecified atrial flutter: Secondary | ICD-10-CM

## 2024-09-14 MED ORDER — APIXABAN 5 MG PO TABS: 5.0000 mg | ORAL_TABLET | Freq: Two times a day (BID) | ORAL | 11 refills | Status: AC

## 2024-09-14 NOTE — Telephone Encounter (Signed)
 Spoke with patient and scheduled nurse visit for 09/15/24 at 11:00 AM for EKG per Dr. Okey.   Please see secure chat message exchange prompting nurse visit below:  Lynwood Hope, PA-C  Wed 3:24 PM Dr. Okey, I am reviewing the attached pt's chart for upcoming lap chole on 11/5. You recently saw her on 8/28 for followup/preop eval and per note felt she was low risk from cardiac standpoint. She was subsequently admitted 9/23 through 08/16/2024 for acute respiratory failure secondary to bacterial infection and COVID-pneumonia. She has recovered well from that, but it looks like her EKG on admission showed she was back in afib and it wasn't really noted/addressed during admission. I wanted to see if you felt she needed any other eval prior to surgery. If not, we'll plan to repeat EKG on day of surgery and proceed if rate controlled.   Vina Okey GAILS, MD  Wed 9:03 PM That is the first EKG of afib that I saw She had aflutter ablation and was taken off of Eliquis in 2019 Shee needs to be on Eliquis now If she could take for a couple days then hold before surgery Will call patient tomorrow to discuss Get her to come in for EKG

## 2024-09-14 NOTE — Telephone Encounter (Signed)
 Eliquis 5 mg BID sent to pharmacy per Dr. Okey.  Patient notified via MyChart as discussed on phone earlier this morning.

## 2024-09-15 ENCOUNTER — Ambulatory Visit: Attending: Cardiovascular Disease

## 2024-09-15 VITALS — BP 124/80 | HR 64 | Ht 66.0 in | Wt 273.0 lb

## 2024-09-15 DIAGNOSIS — I4892 Unspecified atrial flutter: Secondary | ICD-10-CM

## 2024-09-15 NOTE — Progress Notes (Unsigned)
   Nurse Visit   Date of Encounter: 09/15/2024 ID: Veronica Hansen, DOB 08/13/1946, MRN [id number]  PCP:  Yolande Toribio [name], MD   [name] Providers Cardiologist:  Vina Gull, MD { Click to update primary MD,subspecialty MD or APP then REFRESH:1}     Visit Details   VS:  BP 124/80   Pulse 64   Ht 5' 6 (1.676 m)   Wt 273 lb (123.8 kg)   SpO2 94%   BMI 44.06 kg/m  , BMI Body mass index is 44.06 kg/m.  Wt Readings from Last 3 Encounters:  09/15/24 273 lb (123.8 kg)  09/12/24 274 lb (124.3 kg)  08/30/24 271 lb 3.2 oz (123 kg)     Reason for visit: EKG Performed today: Vitals, EKG, provider consult Changes (medications, testing, etc.) : No changes Length of Visit: 20 minutes    Medications Adjustments/Labs and Tests Ordered: Orders Placed This Encounter  Procedures   EKG 12-Lead   No orders of the defined types were placed in this encounter.  Pt presents for EKG at the request of Dr Gull.  Pt recently found to be in Afib during hospital stay. and is scheduled for a Lap cholesystectomy on 09/20/2024.  Started on Eliquis by Dr Gull 09/14/2024.  ECG reviewed by Dr Wonda.  Pt currently in normal sinus rhythm.    Signed, Aldona FORBES Marina, RN  09/15/2024 1:06 PM

## 2024-09-15 NOTE — Patient Instructions (Signed)
 Medication Instructions:  Your physician recommends that you continue on your current medications as directed. Please refer to the Current Medication list given to you today.  *If you need a refill on your cardiac medications before your next appointment, please call your pharmacy*  Lab Work: None ordered.  If you have labs (blood work) drawn today and your tests are completely normal, you will receive your results only by: MyChart Message (if you have MyChart) OR A paper copy in the mail If you have any lab test that is abnormal or we need to change your treatment, we will call you to review the results.  Testing/Procedures: EKG today  Follow-Up: At [facility], you and your health needs are our priority.  As part of our continuing mission to provide you with exceptional heart care, our providers are all part of one team.  This team includes your primary Cardiologist (physician) and Advanced Practice Providers or APPs (Physician Assistants and Nurse Practitioners) who all work together to provide you with the care you need, when you need it.  Your next appointment:   As scheduled

## 2024-09-18 ENCOUNTER — Ambulatory Visit
Admission: RE | Admit: 2024-09-18 | Discharge: 2024-09-18 | Disposition: A | Discharge status: Home / Self Care | Source: Ambulatory Visit | Attending: Radiology | Admitting: Radiology

## 2024-09-18 DIAGNOSIS — D1771 Benign lipomatous neoplasm of kidney: Secondary | ICD-10-CM

## 2024-09-18 HISTORY — PX: IR RADIOLOGIST EVAL & MGMT: IMG5224

## 2024-09-18 NOTE — Progress Notes (Signed)
 Chief Complaint: Patient was consulted remotely today (TeleHealth) for follow up after embolization of right renal angiomyolipoma.  History of Present Illness: Veronica Hansen is a 78 y.o. 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 Two separate upper pole subsegmental right renal artery branches were selectively catheterized and arterial supply to the AML embolized with 300-500 micron sized embolization particles. Veronica Hansen tolerated the procedure well with no complications. A follow up MRI was performed on 09/04/24.  She is scheduled for cholecystectomy this week with Dr. Aron. She had an ERCP in August by Dr. Charlanne for choledocholithiasis and stones were removed from the CBD at that time.  Past Medical History:  Diagnosis Date   A-fib (HCC)    A-fib (HCC) 07/2018   treated with ablation procedure in October 2019   Allergic rhinitis    Anemia    Arthritis    joints hurt   ASCVD (arteriosclerotic cardiovascular disease)    Atypical chest pain    cardiology consult showed an echocardiogram with left ventricular hypertrophy normal left ventricular function.   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   COPD (chronic obstructive pulmonary disease) (HCC)    Depression    husband died of Covid Oct 04, 2020   Diabetes mellitus    type 2, no longer takes medication    Diabetes mellitus (HCC)    Dyspnea    GERD (gastroesophageal reflux disease)    History of blood transfusion    HTN (hypertension)    Hypercholesterolemia    Hyperlipidemia    Hypertension    Incontinence    Iron deficiency anemia    Lower esophageal sphincter, relaxation    Morbid obesity (HCC)    OSA (obstructive sleep apnea)    OSA on CPAP 03/07/2014   OSA on CPAP    Proteinuria 2001   Psoriasis    RLL pneumonia 06/2016   Runny nose    seasonal - poss allergies   Sinus problem    SOB (shortness of breath)    Tobacco use    Stopped in 2003-10-05    Past  Surgical History:  Procedure Laterality Date   A-FLUTTER ABLATION N/A 08/19/2018   Procedure: A-FLUTTER ABLATION;  Surgeon: Waddell Danelle ORN, MD;  Location: [facility];  Service: Cardiovascular;  Laterality: N/A;   ATRIAL FLUTTER ABLATION     BARIATRIC SURGERY  October 04, 2012   dental implants  11/16/2009   ERCP N/A 06/26/2024   Procedure: ERCP, WITH INTERVENTION IF INDICATED;  Surgeon: Charlanne Groom, MD;  Location: [facility] ENDOSCOPY;  Service: Gastroenterology;  Laterality: N/A;   ESOPHAGOGASTRODUODENOSCOPY N/A 04/24/2024   Procedure: EGD (ESOPHAGOGASTRODUODENOSCOPY);  Surgeon: Wilhelmenia Aloha Raddle., MD;  Location: [facility];  Service: Gastroenterology;  Laterality: N/A;   EUS N/A 04/24/2024   Procedure: ULTRASOUND, UPPER GI TRACT, ENDOSCOPIC;  Surgeon: Wilhelmenia Aloha Raddle., MD;  Location: [facility] ENDOSCOPY;  Service: Gastroenterology;  Laterality: N/A;   FINE NEEDLE ASPIRATION  04/24/2024   Procedure: FINE NEEDLE ASPIRATION;  Surgeon: Wilhelmenia Aloha Raddle., MD;  Location: [facility];  Service: Gastroenterology;;   IR EMBO TUMOR ORGAN ISCHEMIA INFARCT INC GUIDE ROADMAPPING  01/21/2024   IR RADIOLOGIST EVAL & MGMT  12/27/2023   IR RENAL SUPRASEL UNI S&I MOD SED  01/21/2024   IR RENAL SUPRASEL UNI S&I MOD SED  01/21/2024   IR US  GUIDE VASC ACCESS RIGHT  01/21/2024   LARYNX SURGERY     polyp removed   left  breast bx was fibrocystic change     left cataract extraction and lens implantation  01/11/2019   ORIF ULNAR FRACTURE Right 02/15/2021   Procedure: OPEN REDUCTION INTERNAL FIXATION (ORIF) ULNAR FRACTURE and distal radius open reduction and internal fixation and repair;  Surgeon: Shari Easter, MD;  Location: [facility] OR;  Service: Orthopedics;  Laterality: Right;  with IV sedation   Right TKR  2007   TOTAL HIP ARTHROPLASTY Right 06/03/2023   Procedure: TOTAL HIP ARTHROPLASTY ANTERIOR APPROACH;  Surgeon: Ernie Cough, MD;  Location: [facility] ORS;  Service: Orthopedics;  Laterality: Right;   TOTAL KNEE ARTHROPLASTY   11/17/2003   left   TOTAL KNEE ARTHROPLASTY  11/16/2005   right    Allergies: Clindamycin/lincomycin, Orlistat, and Phentermine hcl  Medications: Prior to Admission medications   Medication Sig Start Date End Date Taking? Authorizing Provider  albuterol  (PROVENTIL ) (2.5 MG/3ML) 0.083% nebulizer solution Take 3 mLs (2.5 mg total) by nebulization every 6 (six) hours as needed for wheezing or shortness of breath. 08/30/24   Adrien Winfred Berke, MD  albuterol  (VENTOLIN  HFA) [phone] Base) MCG/ACT inhaler Inhale 2 puffs into the lungs every 6 (six) hours as needed for wheezing or shortness of breath. 08/30/24   Adrien Winfred Berke, MD  amLODipine  (NORVASC ) 5 MG tablet TAKE 1 TABLET BY MOUTH DAILY 09/06/23   Okey Vina GAILS, MD  apixaban (ELIQUIS) 5 MG TABS tablet Take 1 tablet (5 mg total) by mouth 2 (two) times daily. 09/14/24   Okey Vina GAILS, MD  benzonatate  (TESSALON ) 100 MG capsule Take 1 capsule (100 mg total) by mouth 3 (three) times daily as needed for cough. Patient not taking: Reported on 09/15/2024 08/16/24   Arlon Carliss ORN, DO  cholecalciferol (VITAMIN D3) 25 MCG (1000 UNIT) tablet Take 1,000 Units by mouth daily.    [provider]  escitalopram  (LEXAPRO ) 10 MG tablet Take 10 mg by mouth daily. 10/04/20   [provider]  ezetimibe  (ZETIA ) 10 MG tablet Take 10 mg by mouth daily. 01/09/21   [provider]  ferrous sulfate  325 (65 FE) MG EC tablet Take 325 mg by mouth daily.    [provider]  guaiFENesin (MUCINEX) 600 MG 12 hr tablet Take 1 tablet (600 mg total) by mouth 2 (two) times daily. Patient not taking: Reported on 09/15/2024 08/30/24 09/29/24  Adrien Winfred Berke, MD  metoprolol  succinate (TOPROL -XL) 100 MG 24 hr tablet Take 100 mg by mouth daily. 02/05/21   [provider]  Multiple Vitamins-Minerals (PRESERVISION AREDS 2 PO) Take 1 capsule by mouth in the morning and at bedtime. Patient not taking: Reported on  09/15/2024    [provider]  olmesartan (BENICAR) 40 MG tablet Take 40 mg by mouth daily. 01/09/21   [provider]  omeprazole  (PRILOSEC) 40 MG capsule Take 1 capsule (40 mg total) by mouth 2 (two) times daily before a meal. 04/24/24   Mansouraty, Aloha Raddle., MD  Polyethyl Glycol-Propyl Glycol (SYSTANE OP) Place 1 drop into both eyes as needed (dry eyes).    [provider]  simvastatin  (ZOCOR ) 40 MG tablet Take 40 mg by mouth daily.    [provider]     Family History  Problem Relation Age of Onset   Diabetes Father    Diabetes Brother    Colon cancer Neg Hx    Rectal cancer Neg Hx    Stomach cancer Neg Hx     Social History   Socioeconomic History   Marital status:  Widowed    Spouse name: Not on file   Number of children: 3   Years of education: Not on file   Highest education level: Bachelor's degree (e.g., BA, AB, BS)  Occupational History   Occupation: Retired  Tobacco Use   Smoking status: Former    Current packs/day: 0.00    Types: Cigarettes    Start date: 08/19/1980    Quit date: 08/19/2000    Years since quitting: 24.0   Smokeless tobacco: Never   Tobacco comments:    Smoked 10 years, about 1.5ppd  Vaping Use   Vaping status: Never Used  Substance and Sexual Activity   Alcohol  use: No    Comment: Rare   Drug use: No   Sexual activity: Not Currently    Comment: Quit ~ 2007  Other Topics Concern   Not on file  Social History Narrative   Not on file   Social Drivers of Health   Financial Resource Strain: Not on file  Food Insecurity: No Food Insecurity (08/08/2024)   Hunger Vital Sign    Worried About Running Out of Food in the Last Year: Never true    Ran Out of Food in the Last Year: Never true  Transportation Needs: No Transportation Needs (08/08/2024)   PRAPARE - Administrator, Civil Service (Medical): No    Lack of Transportation (Non-Medical): No  Physical Activity: Not on file  Stress: Not on  file  Social Connections: Moderately Integrated (08/08/2024)   Social Connection and Isolation Panel    Frequency of Communication with Friends and Family: Three times a week    Frequency of Social Gatherings with Friends and Family: More than three times a week    Attends Religious Services: More than 4 times per year    Active Member of Clubs or Organizations: Yes    Attends Banker Meetings: More than 4 times per year    Marital Status: Widowed    Review of Systems  Constitutional: Negative.   Respiratory: Negative.    Cardiovascular: Negative.   Gastrointestinal: Negative.   Genitourinary: Negative.   Musculoskeletal: Negative.   Neurological: Negative.     Review of Systems: A 12 point ROS discussed and pertinent positives are indicated in the HPI above.  All other systems are negative.    Physical Exam No direct physical exam was performed (except for noted visual exam findings with Video Visits).   Vital Signs: There were no vitals taken for this visit.  Imaging: MR ABDOMEN WWO CONTRAST Addendum Date: 09/18/2024 ADDENDUM REPORT: 09/18/2024 12:01 ADDENDUM: According to history made available, patient had interval particle embolization for AML on 01/21/2024 explaining diminished size and lack of enhancement. Electronically Signed   By: Megan  Zare M.D.   On: 09/18/2024 12:01   Result Date: 09/18/2024 CLINICAL DATA:  Renal angiomyolipoma EXAM: MRI ABDOMEN WITHOUT AND WITH CONTRAST TECHNIQUE: Multiplanar multisequence MR imaging of the abdomen was performed both before and after the administration of intravenous contrast. CONTRAST:  Vueway 10 ml COMPARISON:  CT abdomen July 28 2023, MRI abdomen September 07, 2023. FINDINGS: Lower chest: Left lower lobe linear scarring/atelectasis. Hepatobiliary: Hepatomegaly measuring 20.6 cm in craniocaudal dimension. No significant hepatic steatosis. Cholelithiasis. Suggestion of fundal gallbladder adenomyomatosis. No  intrahepatic biliary dilatation. CBD is normal. Pancreas: T2 hyperintense lesion with thick internal septation measuring 2.4 x 1.9 cm in pancreatic tail, previously measured 2.6 x 2.2 cm.No pancreatic ductal dilatation. There is peripheral rim and internal septation mild enhancement on  postcontrast images. Spleen: Normal in size without focal abnormality. Adrenals/Urinary Tract: Adrenal glands are unremarkable. Bilateral T2 hyperintense simple renal cortical cysts. There is a right kidney superolateral cortical T1 hyperintense T2 hypointense fat containing lesion measuring 3.8 cm with hairline internal septations and signal drop on out of phase sequence, correlating to fat containing lesion on prior CT previously measured 4.6 cm. This lesion was previously measured 5.1 x 5.2 cm on prior MRI. No significant postcontrast enhancement or new solid component. There is a T2 hypointense 5 mm in cyst in the right kidney lower pole, likely a cyst containing proteinaceous/hemorrhagic products. No hydronephrosis. Duplicated left collecting system. Stomach/Bowel: Status post sleeve gastrectomy. Small hiatal hernia. Appendix appears normal. No evidence of bowel wall thickening, distention, or inflammatory changes. Vascular/Lymphatic: No significant vascular findings are present. No enlarged abdominal or pelvic lymph nodes. Reproductive: Uterus and bilateral adnexa are unremarkable. Other: No abdominal wall hernia or abnormality. No abdominopelvic ascites. Musculoskeletal: No acute or significant osseous findings. IMPRESSION: Interval decrease in size of the fat containing lesion in right kidney superolateral cortex without significant enhancement most consistent with benign lesion such as angiomyolipoma. Given the decrease in size and lack of enhancement, fat containing renal cell carcinoma is unlikely. Follow-up to ensure stability. Additional simple and complex renal cortical cysts are stable. No imaging follow-up required.  Pancreatic tail septated cystic lesion, stable to prior which may represent a cystic neoplasia versus a complex pseudocyst. No pancreatic ductal dilatation. Recommend continued attention on follow-up exams. Cholelithiasis. Electronically Signed: By: Megan  Zare M.D. On: 09/05/2024 14:18    Labs:  CBC: Recent Labs    08/14/24 0808 08/15/24 0449 08/16/24 0159 09/12/24 0925  WBC 18.9* 14.2* 12.5* 5.9  HGB 13.9 12.9 12.1 12.8  HCT 43.2 40.6 38.5 41.2  PLT 380 299 275 207    COAGS: Recent Labs    01/21/24 0753 06/26/24 0730  INR 1.0 1.0    BMP: Recent Labs    08/14/24 0808 08/15/24 0449 08/16/24 0159 09/12/24 0925  NA 139 139 136 139  K 4.2 4.1 4.1 3.9  CL 101 103 100 103  CO2 26 26 25 27   GLUCOSE 92 98 110* 97  BUN 19 22 23 10   CALCIUM 9.5 8.9 8.8* 9.2  CREATININE 0.65 0.61 0.70 0.55  GFRNONAA >60 >60 >60 >60    LIVER FUNCTION TESTS: Recent Labs    01/21/24 0753 04/24/24 1253 06/26/24 0730 08/08/24 1217  BILITOT 0.7 1.1 1.1 1.5*  AST 20 22 27 21   ALT 16 14 17 10   ALKPHOS 65 53 69 105  PROT 7.2 5.8* 7.0 7.9  ALBUMIN 4.1 3.1* 3.7 4.2    Assessment and Plan:  I spoke with Veronica Hansen and reviewed the follow up MRI findings with her. The right upper pole AML has decreased significantly in size from maximal diameter of approximately 5.4 cm prior to embolization to 3.7 cm. The lesion is now also completely avascular with no enhancement after contrast administration compared to irregular enhancement seen previously throughout the lesion. I recommended a follow up MRI in one year. We can likely increase imaging interval further after next year if the lesion is stable or smaller.   Electronically Signed: Marcey ONEIDA Moan 09/18/2024, 3:24 PM    I spent a total of 10 Minutes in remote  clinical consultation, greater than 50% of which was counseling/coordinating care post embolization of a right renal angiomyolipoma.    Visit type: Audio only (telephone). Audio  (no video) only due  to patient's lack of internet/smartphone capability. Alternative for in-person consultation at [facility], [address], [place], [place]. This visit type was conducted due to national recommendations for restrictions regarding the COVID-19 Pandemic (e.g. social distancing).  This format is felt to be most appropriate for this patient at this time.  All issues noted in this document were discussed and addressed.

## 2024-09-19 ENCOUNTER — Ambulatory Visit (HOSPITAL_COMMUNITY)
Admission: RE | Admit: 2024-09-19 | Discharge: 2024-09-19 | Disposition: A | Discharge status: Home / Self Care | Source: Ambulatory Visit | Attending: Cardiology | Admitting: Cardiology

## 2024-09-19 DIAGNOSIS — J439 Emphysema, unspecified: Secondary | ICD-10-CM | POA: Diagnosis not present

## 2024-09-19 DIAGNOSIS — I700 Atherosclerosis of aorta: Secondary | ICD-10-CM | POA: Diagnosis not present

## 2024-09-19 DIAGNOSIS — I7121 Aneurysm of the ascending aorta, without rupture: Secondary | ICD-10-CM | POA: Diagnosis not present

## 2024-09-19 DIAGNOSIS — J189 Pneumonia, unspecified organism: Secondary | ICD-10-CM | POA: Diagnosis not present

## 2024-09-19 DIAGNOSIS — R918 Other nonspecific abnormal finding of lung field: Secondary | ICD-10-CM | POA: Diagnosis not present

## 2024-09-19 MED ORDER — IOHEXOL 350 MG/ML SOLN
75.0000 mL | Freq: Once | INTRAVENOUS | Status: AC | PRN
  Administered 2024-09-19: 75 mL via INTRAVENOUS

## 2024-09-19 NOTE — H&P (Signed)
 Veronica Hansen is an 78 y.o. female.   Chief Complaint: gallstones HPI:  Patient presented to medical attention for gallstones and cystic pancreatic mass discovered incidentally on imaging for aneurysm.  With the choledocholithiasis, she had ERCP and stone extraction.  The cystic pancreatic mass appeared consistent with pseudocyst based on pancreatic calcifications nearby.  The cyst fluid from EUS was lost in transport so no CEA/Amylase was obtained.    Past Medical History:  Diagnosis Date   A-fib (HCC)    A-fib (HCC) 07/2018   treated with ablation procedure in October 2019   Allergic rhinitis    Anemia    Arthritis    joints hurt   ASCVD (arteriosclerotic cardiovascular disease)    Atypical chest pain    cardiology consult showed an echocardiogram with left ventricular hypertrophy normal left ventricular function.   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   COPD (chronic obstructive pulmonary disease) (HCC)    Depression    husband died of Covid 2020/10/11   Diabetes mellitus    type 2, no longer takes medication    Diabetes mellitus (HCC)    Dyspnea    GERD (gastroesophageal reflux disease)    History of blood transfusion    HTN (hypertension)    Hypercholesterolemia    Hyperlipidemia    Hypertension    Incontinence    Iron deficiency anemia    Lower esophageal sphincter, relaxation    Morbid obesity (HCC)    OSA (obstructive sleep apnea)    OSA on CPAP 03/07/2014   OSA on CPAP    Proteinuria 2001   Psoriasis    RLL pneumonia 06/2016   Runny nose    seasonal - poss allergies   Sinus problem    SOB (shortness of breath)    Tobacco use    Stopped in 2003/10/12    Past Surgical History:  Procedure Laterality Date   A-FLUTTER ABLATION N/A 08/19/2018   Procedure: A-FLUTTER ABLATION;  Surgeon: Waddell Danelle ORN, MD;  Location: [facility];  Service: Cardiovascular;  Laterality: N/A;   ATRIAL FLUTTER ABLATION     BARIATRIC SURGERY  2012/10/11   dental implants  11/16/2009    ERCP N/A 06/26/2024   Procedure: ERCP, WITH INTERVENTION IF INDICATED;  Surgeon: Charlanne Groom, MD;  Location: [facility] ENDOSCOPY;  Service: Gastroenterology;  Laterality: N/A;   ESOPHAGOGASTRODUODENOSCOPY N/A 04/24/2024   Procedure: EGD (ESOPHAGOGASTRODUODENOSCOPY);  Surgeon: Wilhelmenia Aloha Raddle., MD;  Location: [facility];  Service: Gastroenterology;  Laterality: N/A;   EUS N/A 04/24/2024   Procedure: ULTRASOUND, UPPER GI TRACT, ENDOSCOPIC;  Surgeon: Wilhelmenia Aloha Raddle., MD;  Location: [facility] ENDOSCOPY;  Service: Gastroenterology;  Laterality: N/A;   FINE NEEDLE ASPIRATION  04/24/2024   Procedure: FINE NEEDLE ASPIRATION;  Surgeon: Wilhelmenia Aloha Raddle., MD;  Location: [facility];  Service: Gastroenterology;;   IR EMBO TUMOR ORGAN ISCHEMIA INFARCT INC GUIDE ROADMAPPING  01/21/2024   IR RADIOLOGIST EVAL & MGMT  12/27/2023   IR RADIOLOGIST EVAL & MGMT  09/18/2024   IR RENAL SUPRASEL UNI S&I MOD SED  01/21/2024   IR RENAL SUPRASEL UNI S&I MOD SED  01/21/2024   IR US  GUIDE VASC ACCESS RIGHT  01/21/2024   LARYNX SURGERY     polyp removed   left breast bx was fibrocystic change     left cataract extraction and lens implantation  01/11/2019   ORIF ULNAR FRACTURE Right 02/15/2021   Procedure: OPEN REDUCTION INTERNAL FIXATION (ORIF) ULNAR FRACTURE and distal radius open reduction  and internal fixation and repair;  Surgeon: Shari Easter, MD;  Location: [facility] OR;  Service: Orthopedics;  Laterality: Right;  with IV sedation   Right TKR  2007   TOTAL HIP ARTHROPLASTY Right 06/03/2023   Procedure: TOTAL HIP ARTHROPLASTY ANTERIOR APPROACH;  Surgeon: Ernie Cough, MD;  Location: [facility] ORS;  Service: Orthopedics;  Laterality: Right;   TOTAL KNEE ARTHROPLASTY  11/17/2003   left   TOTAL KNEE ARTHROPLASTY  11/16/2005   right    Family History  Problem Relation Age of Onset   Diabetes Father    Diabetes Brother    Colon cancer Neg Hx    Rectal cancer Neg Hx    Stomach cancer Neg Hx    Social History:  reports that she  quit smoking about 24 years ago. Her smoking use included cigarettes. She started smoking about 44 years ago. She has never used smokeless tobacco. She reports that she does not drink alcohol  and does not use drugs.  Allergies:  Allergies  Allergen Reactions   Clindamycin/Lincomycin Itching   Orlistat Diarrhea    Other reaction(s): severe diarrhea   Phentermine Hcl     Other reaction(s): elevated BP    No medications prior to admission.    No results found for this or any previous visit (from the past 48 hours). CT ANGIO CHEST AORTA W/CM & OR WO/CM Result Date: 09/19/2024 CLINICAL DATA:  Follow-up thoracic aortic aneurysm. Follow-up pneumonia. EXAM: CT ANGIOGRAPHY CHEST WITH CONTRAST TECHNIQUE: Multidetector CT imaging of the chest was performed using the standard protocol during bolus administration of intravenous contrast. Multiplanar CT image reconstructions and MIPs were obtained to evaluate the vascular anatomy. RADIATION DOSE REDUCTION: This exam was performed according to the departmental dose-optimization program which includes automated exposure control, adjustment of the mA and/or kV according to patient size and/or use of iterative reconstruction technique. CONTRAST:  75mL OMNIPAQUE  IOHEXOL  350 MG/ML SOLN COMPARISON:  08/08/2024 and 07/14/2023 FINDINGS: Cardiovascular: Mild stable cardiomegaly. Mild calcified plaque over the 3 vessel coronary arteries. Pulmonary arterial system is normal. Bovine arch morphology. Stable ascending thoracic aortic aneurysm measuring 4.5 cm in AP diameter. Aortic root measures 3.5 cm and sinotubular junction 2.7 cm. Mid to distal aortic arch measures 3.2 cm. Descending thoracic aorta measures 3.6 cm. There is mild calcified plaque throughout the descending thoracic aorta. Mediastinum/Nodes: No significant mediastinal or hilar adenopathy. Surgical suture line over the gastric cardia/fundus with sliding hiatal hernia present unchanged. Lungs/Pleura: Lungs are  adequately inflated. There is mild centrilobular emphysematous disease. Minimal linear scarring over the right base. Near complete resolution of the previously seen consolidation over the posteromedial left lower lobe with minimal residual linear density over the left lower lobe likely scarring/atelectasis. No effusion. Airways are unremarkable. Upper Abdomen: Mild nodular contour to the liver which could be seen with cirrhosis. Cholelithiasis. Multiple bilateral renal cysts incompletely evaluated on this exam. 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 Musculoskeletal: No focal abnormality. Review of the MIP images confirms the above findings. IMPRESSION: 1. Near complete resolution of the previously seen consolidation over the posteromedial left lower lobe with minimal residual linear density over the left lower lobe likely scarring/atelectasis. 2. 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 Ascending thoracic aortic aneurysm. Recommend semi-annual imaging followup by CTA or MRA and referral to cardiothoracic surgery if not already obtained. This recommendation follows 2010 ACCF/AHA/AATS/ACR/ASA/SCA/SCAI/SIR/STS/SVM Guidelines for the Diagnosis and Management  of Patients With Thoracic Aortic Disease. Circulation. 2010; 121: Z733-z630. Aortic aneurysm NOS (ICD10-I71.9). 3. Mild centrilobular emphysematous disease. 4. Mild nodular contour to the liver which could be seen with cirrhosis. 5. Cholelithiasis. 6. Multiple bilateral renal cysts incompletely evaluated on this exam. 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 7. Aortic atherosclerosis. Atherosclerotic coronary artery disease. Aortic Atherosclerosis (ICD10-I70.0) and Emphysema (ICD10-J43.9). Electronically Signed   By: Toribio Agreste M.D.   On: 09/19/2024 10:08   IR Radiologist Eval & Mgmt Result Date:  09/18/2024 CLINICAL DATA:  IR clinic follow up. EXAM: IR EVAL AND MANAGEMENT COMPARISON:  None Available. FINDINGS: See dictated note in Epic. IMPRESSION: See dictated note in Epic. Electronically Signed   By: Marcey Moan M.D.   On: 09/18/2024 16:47    Review of Systems  All other systems reviewed and are negative.   There were no vitals taken for this visit. Physical Exam Vitals reviewed.  Constitutional:      General: She is not in acute distress.    Appearance: Normal appearance. She is not ill-appearing.  HENT:     Head: Normocephalic and atraumatic.     Right Ear: External ear normal.     Left Ear: External ear normal.     Nose: Nose normal.  Eyes:     General: No scleral icterus.    Conjunctiva/sclera: Conjunctivae normal.     Pupils: Pupils are equal, round, and reactive to light.  Cardiovascular:     Rate and Rhythm: Normal rate.     Pulses: Normal pulses.  Pulmonary:     Effort: Pulmonary effort is normal. No respiratory distress.  Abdominal:     General: There is no distension.     Palpations: Abdomen is soft.     Tenderness: There is no abdominal tenderness.  Musculoskeletal:     Cervical back: Neck supple.  Skin:    General: Skin is warm and dry.     Capillary Refill: Capillary refill takes 2 to 3 seconds.     Coloration: Skin is not jaundiced or pale.  Neurological:     General: No focal deficit present.     Mental Status: She is alert and oriented to person, place, and time. Mental status is at baseline.  Psychiatric:        Mood and Affect: Mood normal.        Behavior: Behavior normal.        Thought Content: Thought content normal.        Judgment: Judgment normal.      Assessment/Plan Choledocholithiasis Cystic pancreatic mass- tail of pancreas.  Will plan lap chole.  Panc mass to get repeat imaging and possible repeat EUS at later date.    Reviewed cholecystectomy, answered questions.  Reviewed risks.     Jina LITTIE Nephew, MD, FACS,  FSSO Surgical Oncology, General Surgery, Trauma and Critical [facility], [place] [phone] for weekday/non holidays Check [email or web address] for coverage night/weekend/holidays under General Surgery

## 2024-09-19 NOTE — Anesthesia Preprocedure Evaluation (Signed)
 Anesthesia Evaluation    Airway Mallampati: III  TM Distance: >3 FB Neck ROM: Full    Dental  (+) Edentulous Lower, Edentulous Upper, Dental Advisory Given   Pulmonary shortness of breath, sleep apnea and Continuous Positive Airway Pressure Ventilation , pneumonia, resolved, COPD,  COPD inhaler, former smoker   breath sounds clear to auscultation       Cardiovascular hypertension, Pt. on medications and Pt. on home beta blockers (-) angina (-) Past MI and (-) CHF  Rhythm:Regular     Neuro/Psych neg Seizures PSYCHIATRIC DISORDERS  Depression       GI/Hepatic Neg liver ROS,GERD  Medicated and Controlled,,  Endo/Other  diabetes  Class 3 obesity  Renal/GU negative Renal ROSLab Results      Component                Value               Date                      NA                       139                 09/12/2024                K                        3.9                 09/12/2024                CO2                      27                  09/12/2024                GLUCOSE                  97                  09/12/2024                BUN                      10                  09/12/2024                CREATININE               0.55                09/12/2024                CALCIUM                  9.2                 09/12/2024                GFR                      89.77               09/15/2023  GFRNONAA                 >60                 09/12/2024                Musculoskeletal  (+) Arthritis ,    Abdominal   Peds  Hematology negative hematology ROS (+) Lab Results      Component                Value               Date                      WBC                      5.9                 09/12/2024                HGB                      12.8                09/12/2024                HCT                      41.2                09/12/2024                MCV                      83.7                 09/12/2024                PLT                      207                 09/12/2024              Anesthesia Other Findings Anesthesia Chart Review:   78 year old female pertinent history including former smoker (quit 2001), mild COPD, hiatal hernia, GERD on PPI, HTN, atrial flutter s/p ablation 2019, OSA on CPAP, aortic aneurysm, s/p bariatric surgery 2013, class III obesity BMI 43.  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   Patient was seen by cardiologist Dr. Okey on 07/05/2024 for follow-up as well as preop evaluation.  She was noted to be stable from cardiac standpoint.  Per note, Plan for choley Not scheduled yet From a cardiac standpoint I feel she is at low risk for major cardiac event.   Recent admission 9/23 through 08/16/2024 for acute respiratory failure secondary to bacterial infection and COVID-pneumonia.  She was also noted to be in atrial fibrillation on admission with a rate of 105.  Completed therapy with azithromycin , Rocephin , Solu-Medrol .  At discharge she was maintaining O2 sats >93% with ambulation on room air.  She had outpatient pulmonology follow-up with Dr. Adrien Guan on 08/30/2024.  Per note she continued to have productive cough.  CT scan showed  multiple mucous plugging airways.  She was recommended to use Mucinex and albuterol  twice a day for 2 weeks for chronic bronchitis.  She was also given a flutter valve.   I evaluated patient in her preadmission testing visit on 09/12/2024.  She reports significant improvement after starting albuterol  nebulizers, flutter valve, and Mucinex.  States her cough resolved within a few days after her pulmonology visit.  Currently, she feels quite well.  Denies any cardiopulmonary complaints.  She has baseline DOE that has been stable for years.  She says she does continue to use albuterol  nebulizer once in the morning but does  not feel she really needs it.  She has discontinued Mucinex.  She continues to use flutter valve.  On exam she is well-appearing, no shortness of breath with conversation.  Lungs are clear bilaterally.  Heart regular rate and rhythm, no murmur.  At time of surgery she will have nearly 3 weeks of complete symptom resolution.  I anticipate that she will be able to proceed as planned barring any acute status change.  I did instruct her to let us  know if she had any recrudescence of her symptoms.   Preop labs reviewed, WNL.   EKG done 08/08/2024 during recent admission notable for new atrial fibrillation.  It does not appear that this was discussed/addressed during admission.  I reached out to patient's cardiologist Dr. Okey for input.  She recommended patient start Eliquis 5 mg twice daily and be seen in cardiology office for repeat EKG.  Repeat EKG 09/15/2024 showed NSR, rate 60, anterior T wave inversions.  I called and spoke with the patient on 09/15/2024 regarding Eliquis and her upcoming surgery.  She stated she was not quite sure what to do with the medication and she had not yet picked it up.  She has some questions regarding the long-term plan.  I did encourage her to reach back out to Dr. Nada office to make sure she had clear instructions.  She understands that if she does start Eliquis, it would need to be held 2 days prior to surgery.  Of note, she also affirmed that she continues to feel well from a respiratory standpoint, denied any symptoms.   EKG 08/08/2024: Atrial fibrillation.  Rate 105.   CTA chest 09/19/2024: IMPRESSION: 1. Near complete resolution of the previously seen consolidation over the posteromedial left lower lobe with minimal residual linear density over the left lower lobe likely scarring/atelectasis. 2. 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 Ascending thoracic aortic aneurysm. Recommend semi-annual imaging followup by CTA or MRA and referral to cardiothoracic  surgery if not already obtained. This recommendation follows 2010 ACCF/AHA/AATS/ACR/ASA/SCA/SCAI/SIR/STS/SVM Guidelines for the Diagnosis and Management of Patients With Thoracic Aortic Disease. Circulation. 2010; 121: Z733-z630. Aortic aneurysm NOS (ICD10-I71.9). 3. Mild centrilobular emphysematous disease. 4. Mild nodular contour to the liver which could be seen with cirrhosis. 5. Cholelithiasis. 6. Multiple bilateral renal cysts incompletely evaluated on this exam. 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 7. Aortic atherosclerosis. Atherosclerotic coronary artery disease.   Cardiac PET CT 12/08/2022: Perfusion/DefectLV perfusion is normal. There is no evidence of ischemia. There is no evidence of infarction. Ejection FractionRest left ventricular function is normal. Rest EF: 59 %. Stress left ventricular function is normal. Stress EF: 65 %. End diastolic cavity size is normal. End systolic cavity size is normal. Myocardial Bood FlowMyocardial blood flow was computed to be 0.22ml/g/min at rest and 2.70ml/g/min at stress. Global myocardial blood flow  reserve was 2.34 and was normal. Calcium ScoringCoronary calcium was present on the attenuation correction CT images. Moderate coronary calcifications were present. Coronary calcifications were present in the left anterior descending artery, left circumflex artery and right coronary artery distribution(s). Stress Combined ConclusionThe study is normal. The study is low risk.         Lynwood Geofm [facility] Short Stay Center/Anesthesiology Phone [phone] 09/19/2024 3:00 PM   Reproductive/Obstetrics negative OB ROS                              Anesthesia Physical Anesthesia Plan  ASA: 3  Anesthesia Plan:    Post-op Pain Management: Tylenol  PO (pre-op)*   Induction: Intravenous  PONV Risk Score and Plan: 3 and Ondansetron  and Dexamethasone   Airway Management  Planned: Oral ETT  Additional Equipment: None  Intra-op Plan:   Post-operative Plan: Extubation in OR  Informed Consent: I have reviewed the patients History and Physical, chart, labs and discussed the procedure including the risks, benefits and alternatives for the proposed anesthesia with the patient or authorized representative who has indicated his/her understanding and acceptance.     Dental advisory given  Plan Discussed with: CRNA  Anesthesia Plan Comments: (PAT note by Lynwood Geofm, PA-C:  78 year old female pertinent history including former smoker (quit 2001), mild COPD, hiatal hernia, GERD on PPI, HTN, atrial flutter s/p ablation 2019, OSA on CPAP, aortic aneurysm, s/p bariatric surgery 2013, class III obesity BMI 43.  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  Patient was seen by cardiologist Dr. Okey on 07/05/2024 for follow-up as well as preop evaluation.  She was noted to be stable from cardiac standpoint.  Per note, Plan for choley Not scheduled yet From a cardiac standpoint I feel she is at low risk for major cardiac event.  Recent admission 9/23 through 08/16/2024 for acute respiratory failure secondary to bacterial infection and COVID-pneumonia.  She was also noted to be in atrial fibrillation on admission with a rate of 105.  Completed therapy with azithromycin , Rocephin , Solu-Medrol .  At discharge she was maintaining O2 sats >93% with ambulation on room air.  She had outpatient pulmonology follow-up with Dr. Adrien Guan on 08/30/2024.  Per note she continued to have productive cough.  CT scan showed multiple mucous plugging airways.  She was recommended to use Mucinex and albuterol  twice a day for 2 weeks for chronic bronchitis.  She was also given a flutter valve.  I evaluated patient in her preadmission testing visit on 09/12/2024.  She reports significant  improvement after starting albuterol  nebulizers, flutter valve, and Mucinex.  States her cough resolved within a few days after her pulmonology visit.  Currently, she feels quite well.  Denies any cardiopulmonary complaints.  She has baseline DOE that has been stable for years.  She says she does continue to use albuterol  nebulizer once in the morning but does not feel she really needs it.  She has discontinued Mucinex.  She continues to use flutter valve.  On exam she is well-appearing, no shortness of breath with conversation.  Lungs are clear bilaterally.  Heart regular rate and rhythm, no murmur.  At time of surgery she will have nearly 3 weeks of complete symptom resolution.  I anticipate that she will be able to proceed as planned barring any acute status change.  I did  instruct her to let us  know if she had any recrudescence of her symptoms.  Preop labs reviewed, WNL.  EKG done 08/08/2024 during recent admission notable for new atrial fibrillation.  It does not appear that this was discussed/addressed during admission.  I reached out to patient's cardiologist Dr. Okey for input.  She recommended patient start Eliquis 5 mg twice daily and be seen in cardiology office for repeat EKG.  Repeat EKG 09/15/2024 showed NSR, rate 60, anterior T wave inversions.  I called and spoke with the patient on 09/15/2024 regarding Eliquis and her upcoming surgery.  She stated she was not quite sure what to do with the medication and she had not yet picked it up.  She has some questions regarding the long-term plan.  I did encourage her to reach back out to Dr. Nada office to make sure she had clear instructions.  She understands that if she does start Eliquis, it would need to be held 2 days prior to surgery.  Of note, she also affirmed that she continues to feel well from a respiratory standpoint, denied any symptoms.  EKG 08/08/2024: Atrial fibrillation.  Rate 105.  CTA chest 09/19/2024: IMPRESSION: 1. Near complete  resolution of the previously seen consolidation over the posteromedial left lower lobe with minimal residual linear density over the left lower lobe likely scarring/atelectasis. 2. 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 Ascending thoracic aortic aneurysm. Recommend semi-annual imaging followup by CTA or MRA and referral to cardiothoracic surgery if not already obtained. This recommendation follows 2010 ACCF/AHA/AATS/ACR/ASA/SCA/SCAI/SIR/STS/SVM Guidelines for the Diagnosis and Management of Patients With Thoracic Aortic Disease. Circulation. 2010; 121: Z733-z630. Aortic aneurysm NOS (ICD10-I71.9). 3. Mild centrilobular emphysematous disease. 4. Mild nodular contour to the liver which could be seen with cirrhosis. 5. Cholelithiasis. 6. Multiple bilateral renal cysts incompletely evaluated on this exam. 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 7. Aortic atherosclerosis. Atherosclerotic coronary artery disease.  Cardiac PET CT 12/08/2022: Perfusion/Defect LV perfusion is normal. There is no evidence of ischemia. There is no evidence of infarction. Ejection Fraction Rest left ventricular function is normal. Rest EF: 59 %. Stress left ventricular function is normal. Stress EF: 65 %. End diastolic cavity size is normal. End systolic cavity size is normal. Myocardial Bood Flow Myocardial blood flow was computed to be 0.55ml/g/min at rest and 2.48ml/g/min at stress. Global myocardial blood flow reserve was 2.34 and was normal. Calcium Scoring Coronary calcium was present on the attenuation correction CT images. Moderate coronary calcifications were present. Coronary calcifications were present in the left anterior descending artery, left circumflex artery and right coronary artery distribution(s). Stress Combined Conclusion The study is normal. The study is low risk.   )         Anesthesia Quick Evaluation

## 2024-09-20 ENCOUNTER — Telehealth: Payer: Self-pay

## 2024-09-20 ENCOUNTER — Ambulatory Visit (HOSPITAL_COMMUNITY)
Admission: RE | Admit: 2024-09-20 | Discharge: 2024-09-20 | Disposition: A | Discharge status: Home / Self Care | Attending: General Surgery | Admitting: General Surgery

## 2024-09-20 ENCOUNTER — Encounter (HOSPITAL_COMMUNITY)
Admission: RE | Disposition: A | Discharge status: Home / Self Care | Payer: Self-pay | Source: Home / Self Care | Attending: General Surgery

## 2024-09-20 ENCOUNTER — Encounter (HOSPITAL_COMMUNITY): Payer: Self-pay | Admitting: General Surgery

## 2024-09-20 ENCOUNTER — Ambulatory Visit (HOSPITAL_COMMUNITY): Payer: Self-pay | Admitting: Physician Assistant

## 2024-09-20 ENCOUNTER — Ambulatory Visit (HOSPITAL_COMMUNITY): Admitting: Anesthesiology

## 2024-09-20 ENCOUNTER — Other Ambulatory Visit: Payer: Self-pay

## 2024-09-20 ENCOUNTER — Other Ambulatory Visit (HOSPITAL_COMMUNITY)

## 2024-09-20 DIAGNOSIS — G4733 Obstructive sleep apnea (adult) (pediatric): Secondary | ICD-10-CM | POA: Diagnosis not present

## 2024-09-20 DIAGNOSIS — E66813 Obesity, class 3: Secondary | ICD-10-CM | POA: Insufficient documentation

## 2024-09-20 DIAGNOSIS — M1990 Unspecified osteoarthritis, unspecified site: Secondary | ICD-10-CM | POA: Insufficient documentation

## 2024-09-20 DIAGNOSIS — I4891 Unspecified atrial fibrillation: Secondary | ICD-10-CM | POA: Diagnosis not present

## 2024-09-20 DIAGNOSIS — I10 Essential (primary) hypertension: Secondary | ICD-10-CM

## 2024-09-20 DIAGNOSIS — K219 Gastro-esophageal reflux disease without esophagitis: Secondary | ICD-10-CM | POA: Insufficient documentation

## 2024-09-20 DIAGNOSIS — K8050 Calculus of bile duct without cholangitis or cholecystitis without obstruction: Secondary | ICD-10-CM | POA: Diagnosis not present

## 2024-09-20 DIAGNOSIS — K449 Diaphragmatic hernia without obstruction or gangrene: Secondary | ICD-10-CM | POA: Insufficient documentation

## 2024-09-20 DIAGNOSIS — Z79899 Other long term (current) drug therapy: Secondary | ICD-10-CM | POA: Diagnosis not present

## 2024-09-20 DIAGNOSIS — Z6841 Body Mass Index (BMI) 40.0 and over, adult: Secondary | ICD-10-CM | POA: Diagnosis not present

## 2024-09-20 DIAGNOSIS — K430 Incisional hernia with obstruction, without gangrene: Secondary | ICD-10-CM | POA: Insufficient documentation

## 2024-09-20 DIAGNOSIS — Z9884 Bariatric surgery status: Secondary | ICD-10-CM | POA: Diagnosis not present

## 2024-09-20 DIAGNOSIS — Z87891 Personal history of nicotine dependence: Secondary | ICD-10-CM | POA: Diagnosis not present

## 2024-09-20 DIAGNOSIS — J432 Centrilobular emphysema: Secondary | ICD-10-CM | POA: Diagnosis not present

## 2024-09-20 DIAGNOSIS — K660 Peritoneal adhesions (postprocedural) (postinfection): Secondary | ICD-10-CM | POA: Diagnosis not present

## 2024-09-20 DIAGNOSIS — K8070 Calculus of gallbladder and bile duct without cholecystitis without obstruction: Secondary | ICD-10-CM | POA: Diagnosis present

## 2024-09-20 DIAGNOSIS — E119 Type 2 diabetes mellitus without complications: Secondary | ICD-10-CM

## 2024-09-20 DIAGNOSIS — K8010 Calculus of gallbladder with chronic cholecystitis without obstruction: Secondary | ICD-10-CM | POA: Insufficient documentation

## 2024-09-20 HISTORY — PX: CHOLECYSTECTOMY: SHX55

## 2024-09-20 HISTORY — PX: LAPAROSCOPIC LYSIS OF ADHESIONS: SHX5905

## 2024-09-20 LAB — GLUCOSE, CAPILLARY
Glucose-Capillary: 106 mg/dL — ABNORMAL HIGH (ref 70–99)
Glucose-Capillary: 97 mg/dL (ref 70–99)
Glucose-Capillary: 138 mg/dL — ABNORMAL HIGH (ref 70–99)

## 2024-09-20 SURGERY — LAPAROSCOPIC CHOLECYSTECTOMY
Anesthesia: General

## 2024-09-20 MED ORDER — CHLORHEXIDINE GLUCONATE CLOTH 2 % EX PADS: 6.0000 | MEDICATED_PAD | Freq: Once | CUTANEOUS | Status: DC

## 2024-09-20 MED ORDER — CEFAZOLIN SODIUM-DEXTROSE 2-4 GM/100ML-% IV SOLN
2.0000 g | INTRAVENOUS | Status: AC
  Administered 2024-09-20: 2 g via INTRAVENOUS
  Filled 2024-09-20: qty 100

## 2024-09-20 MED ORDER — ONDANSETRON HCL 4 MG/2ML IJ SOLN
INTRAMUSCULAR | Status: DC | PRN
  Administered 2024-09-20: 4 mg via INTRAVENOUS

## 2024-09-20 MED ORDER — MIDAZOLAM HCL 2 MG/2ML IJ SOLN
INTRAMUSCULAR | Status: AC
  Filled 2024-09-20: qty 2

## 2024-09-20 MED ORDER — ORAL CARE MOUTH RINSE: 15.0000 mL | Freq: Once | OROMUCOSAL | Status: AC

## 2024-09-20 MED ORDER — OXYCODONE HCL 5 MG/5ML PO SOLN: 5.0000 mg | Freq: Once | ORAL | Status: AC | PRN

## 2024-09-20 MED ORDER — BUPIVACAINE-EPINEPHRINE (PF) 0.25% -1:200000 IJ SOLN
INTRAMUSCULAR | Status: AC
  Filled 2024-09-20: qty 30

## 2024-09-20 MED ORDER — DEXAMETHASONE SOD PHOSPHATE PF 10 MG/ML IJ SOLN
INTRAMUSCULAR | Status: DC | PRN
  Administered 2024-09-20: 5 mg via INTRAVENOUS

## 2024-09-20 MED ORDER — INDOCYANINE GREEN 25 MG IV SOLR: 25.0000 mg | Freq: Once | INTRAVENOUS | Status: DC

## 2024-09-20 MED ORDER — LIDOCAINE HCL 1 % IJ SOLN
INTRAMUSCULAR | Status: DC | PRN
  Administered 2024-09-20: 14 mL via INTRADERMAL

## 2024-09-20 MED ORDER — GLYCOPYRROLATE PF 0.2 MG/ML IJ SOSY
PREFILLED_SYRINGE | INTRAMUSCULAR | Status: AC
Start: 2024-09-20 — End: 2024-09-20
  Filled 2024-09-20: qty 1

## 2024-09-20 MED ORDER — LIDOCAINE HCL 1 % IJ SOLN
INTRAMUSCULAR | Status: AC
  Filled 2024-09-20: qty 20

## 2024-09-20 MED ORDER — PHENYLEPHRINE 80 MCG/ML (10ML) SYRINGE FOR IV PUSH (FOR BLOOD PRESSURE SUPPORT)
PREFILLED_SYRINGE | INTRAVENOUS | Status: DC | PRN
  Administered 2024-09-20 (×4): 80 ug via INTRAVENOUS

## 2024-09-20 MED ORDER — LACTATED RINGERS IV SOLN: INTRAVENOUS | Status: DC

## 2024-09-20 MED ORDER — FENTANYL CITRATE (PF) 100 MCG/2ML IJ SOLN
INTRAMUSCULAR | Status: AC
  Filled 2024-09-20: qty 2

## 2024-09-20 MED ORDER — SUGAMMADEX SODIUM 200 MG/2ML IV SOLN
INTRAVENOUS | Status: DC | PRN
  Administered 2024-09-20: 200 mg via INTRAVENOUS
  Administered 2024-09-20: 50 mg via INTRAVENOUS

## 2024-09-20 MED ORDER — MIDAZOLAM HCL 5 MG/5ML IJ SOLN
INTRAMUSCULAR | Status: DC | PRN
  Administered 2024-09-20: 1 mg via INTRAVENOUS

## 2024-09-20 MED ORDER — CHLORHEXIDINE GLUCONATE 0.12 % MT SOLN
15.0000 mL | Freq: Once | OROMUCOSAL | Status: AC
  Administered 2024-09-20: 15 mL via OROMUCOSAL
  Filled 2024-09-20: qty 15

## 2024-09-20 MED ORDER — ACETAMINOPHEN 10 MG/ML IV SOLN: 1000.0000 mg | Freq: Once | INTRAVENOUS | Status: DC | PRN

## 2024-09-20 MED ORDER — PROPOFOL 10 MG/ML IV BOLUS
INTRAVENOUS | Status: AC
Start: 2024-09-20 — End: 2024-09-20
  Filled 2024-09-20: qty 20

## 2024-09-20 MED ORDER — LIDOCAINE 2% (20 MG/ML) 5 ML SYRINGE
INTRAMUSCULAR | Status: DC | PRN
  Administered 2024-09-20: 100 mg via INTRAVENOUS

## 2024-09-20 MED ORDER — OXYCODONE HCL 5 MG PO TABS
5.0000 mg | ORAL_TABLET | Freq: Once | ORAL | Status: AC | PRN
  Administered 2024-09-20: 5 mg via ORAL

## 2024-09-20 MED ORDER — GLYCOPYRROLATE PF 0.2 MG/ML IJ SOSY
PREFILLED_SYRINGE | INTRAMUSCULAR | Status: DC | PRN
  Administered 2024-09-20: .1 mg via INTRAVENOUS

## 2024-09-20 MED ORDER — INDOCYANINE GREEN 25 MG IV SOLR
2.5000 mg | Freq: Once | INTRAVENOUS | Status: AC
  Administered 2024-09-20: 2.5 mg via TOPICAL

## 2024-09-20 MED ORDER — PROPOFOL 10 MG/ML IV BOLUS
INTRAVENOUS | Status: DC | PRN
  Administered 2024-09-20: 50 mg via INTRAVENOUS
  Administered 2024-09-20: 90 mg via INTRAVENOUS

## 2024-09-20 MED ORDER — FENTANYL CITRATE (PF) 100 MCG/2ML IJ SOLN
INTRAMUSCULAR | Status: DC | PRN
  Administered 2024-09-20: 100 ug via INTRAVENOUS

## 2024-09-20 MED ORDER — ACETAMINOPHEN 500 MG PO TABS
1000.0000 mg | ORAL_TABLET | ORAL | Status: AC
  Administered 2024-09-20: 1000 mg via ORAL
  Filled 2024-09-20: qty 2

## 2024-09-20 MED ORDER — OXYCODONE HCL 5 MG PO TABS: 5.0000 mg | ORAL_TABLET | Freq: Four times a day (QID) | ORAL | 0 refills | Status: AC | PRN

## 2024-09-20 MED ORDER — OXYCODONE HCL 5 MG PO TABS
ORAL_TABLET | ORAL | Status: AC
  Filled 2024-09-20: qty 1

## 2024-09-20 MED ORDER — LIDOCAINE 2% (20 MG/ML) 5 ML SYRINGE
INTRAMUSCULAR | Status: AC
  Filled 2024-09-20: qty 5

## 2024-09-20 MED ORDER — ROCURONIUM BROMIDE 10 MG/ML (PF) SYRINGE
PREFILLED_SYRINGE | INTRAVENOUS | Status: AC
  Filled 2024-09-20: qty 10

## 2024-09-20 MED ORDER — ONDANSETRON HCL 4 MG/2ML IJ SOLN
INTRAMUSCULAR | Status: AC
  Filled 2024-09-20: qty 2

## 2024-09-20 MED ORDER — ROCURONIUM BROMIDE 100 MG/10ML IV SOLN
INTRAVENOUS | Status: DC | PRN
  Administered 2024-09-20: 80 mg via INTRAVENOUS
  Administered 2024-09-20: 20 mg via INTRAVENOUS

## 2024-09-20 MED ORDER — FENTANYL CITRATE (PF) 100 MCG/2ML IJ SOLN: 25.0000 ug | INTRAMUSCULAR | Status: DC | PRN

## 2024-09-20 MED ORDER — INSULIN ASPART 100 UNIT/ML IJ SOLN: 0.0000 [IU] | INTRAMUSCULAR | Status: DC | PRN

## 2024-09-20 SURGICAL SUPPLY — 38 items
BAG COUNTER SPONGE SURGICOUNT (BAG) ×2 IMPLANT
BLADE CLIPPER SURG (BLADE) IMPLANT
CANISTER SUCTION 3000ML PPV (SUCTIONS) ×2 IMPLANT
CHLORAPREP W/TINT 26 (MISCELLANEOUS) ×2 IMPLANT
CLIP APPLIE ROT 10 11.4 M/L (STAPLE) ×2 IMPLANT
COVER SURGICAL LIGHT HANDLE (MISCELLANEOUS) ×2 IMPLANT
DERMABOND ADVANCED .7 DNX12 (GAUZE/BANDAGES/DRESSINGS) ×2 IMPLANT
DRAPE WARM FLUID 44X44 (DRAPES) ×2 IMPLANT
ELECTRODE REM PT RTRN 9FT ADLT (ELECTROSURGICAL) ×2 IMPLANT
GLOVE BIO SURGEON STRL SZ 6 (GLOVE) ×2 IMPLANT
GLOVE INDICATOR 6.5 STRL GRN (GLOVE) ×2 IMPLANT
GOWN STRL REUS W/ TWL LRG LVL3 (GOWN DISPOSABLE) ×4 IMPLANT
GOWN STRL REUS W/ TWL XL LVL3 (GOWN DISPOSABLE) ×2 IMPLANT
GRASPER SUT TROCAR 14GX15 (MISCELLANEOUS) IMPLANT
HEMOSTAT SURGICEL 2X4 FIBR (HEMOSTASIS) IMPLANT
IRRIGATION SUCT STRKRFLW 2 WTP (MISCELLANEOUS) ×2 IMPLANT
KIT BASIN OR (CUSTOM PROCEDURE TRAY) ×2 IMPLANT
KIT IMAGING PINPOINTPAQ (MISCELLANEOUS) IMPLANT
KIT TURNOVER KIT B (KITS) ×2 IMPLANT
LHOOK LAP DISP 36CM (ELECTROSURGICAL) ×2 IMPLANT
PAD ARMBOARD POSITIONER FOAM (MISCELLANEOUS) ×2 IMPLANT
PENCIL BUTTON HOLSTER BLD 10FT (ELECTRODE) ×2 IMPLANT
SCISSORS LAP 5X35 DISP (ENDOMECHANICALS) ×2 IMPLANT
SET TUBE SMOKE EVAC HIGH FLOW (TUBING) ×2 IMPLANT
SLEEVE Z-THREAD 5X100MM (TROCAR) ×2 IMPLANT
SOLN 0.9% NACL POUR BTL 1000ML (IV SOLUTION) ×2 IMPLANT
SOLN STERILE WATER BTL 1000 ML (IV SOLUTION) ×2 IMPLANT
SPIKE FLUID TRANSFER (MISCELLANEOUS) ×2 IMPLANT
SUT MNCRL AB 4-0 PS2 18 (SUTURE) ×2 IMPLANT
SUT VICRYL 0 UR6 27IN ABS (SUTURE) IMPLANT
SYSTEM BAG RETRIEVAL 10MM (BASKET) ×2 IMPLANT
TOWEL GREEN STERILE (TOWEL DISPOSABLE) ×2 IMPLANT
TOWEL GREEN STERILE FF (TOWEL DISPOSABLE) ×2 IMPLANT
TRAY LAPAROSCOPIC MC (CUSTOM PROCEDURE TRAY) ×2 IMPLANT
TROCAR 11X100 Z THREAD (TROCAR) ×2 IMPLANT
TROCAR BALLN 12MMX100 BLUNT (TROCAR) ×2 IMPLANT
TROCAR Z-THREAD OPTICAL 5X100M (TROCAR) ×2 IMPLANT
WARMER LAPAROSCOPE (MISCELLANEOUS) ×2 IMPLANT

## 2024-09-20 NOTE — Discharge Instructions (Addendum)
 OK to restart Eliquis tomorrow (11/6)   [facility] Office Phone Number [phone]   POST OP INSTRUCTIONS  Always review your discharge instruction sheet given to you by the facility where your surgery was performed.  IF YOU HAVE DISABILITY OR FAMILY LEAVE FORMS, YOU MUST BRING THEM TO THE OFFICE FOR PROCESSING.  DO NOT GIVE THEM TO YOUR DOCTOR.  Take 2 tylenol  (acetominophen) three times a day for 3 days.  If you still have pain, add ibuprofen with food in between if able to take this (if you have kidney issues or stomach issues, do not take ibuprofen).  If both of those are not enough, add the narcotic pain pill.  If you find you are needing a lot of this overnight after surgery, call the next morning for a refill.   Take your usually prescribed medications unless otherwise directed If you need a refill on your pain medication, please contact your pharmacy.  They will contact our office to request authorization.  Prescriptions will not be filled after 5pm or on week-ends. You should eat very light the first 24 hours after surgery, such as soup, crackers, pudding, etc.  Resume your normal diet the day after surgery It is common to experience some constipation if taking pain medication after surgery.  Increasing fluid intake and taking a stool softener will usually help or prevent this problem from occurring.  A mild laxative (Milk of Magnesia or Miralax ) should be taken according to package directions if there are no bowel movements after 48 hours. You may shower in 48 hours.  The surgical glue will flake off in 2-3 weeks.   ACTIVITIES:  No strenuous activity or heavy lifting for 1 week.   You may drive when you no longer are taking prescription pain medication, you can comfortably wear a seatbelt, and you can safely maneuver your car and apply brakes. RETURN TO WORK:  __________to be determined. _______________ Veronica Hansen should see your doctor in the office for a follow-up  appointment approximately three-four weeks after your surgery.    WHEN TO CALL YOUR DOCTOR: Fever over 101.0 Nausea and/or vomiting. Extreme swelling or bruising. Continued bleeding from incision. Increased pain, redness, or drainage from the incision.  The clinic staff is available to answer your questions during regular business hours.  Please don't hesitate to call and ask to speak to one of the nurses for clinical concerns.  If you have a medical emergency, go to the nearest emergency room or call 911.  A surgeon from [facility] is always on call at the hospital.  For further questions, please visit [email or web address]         No acetaminophen /Tylenol  until after 1:37 pm today if needed.

## 2024-09-20 NOTE — Telephone Encounter (Signed)
 Dr. Charlanne, their was an imaging reminder in epic for pancreatic cyst. Patient completed MRI on 09/04/2024, results are in epic. Thanks

## 2024-09-20 NOTE — Anesthesia Procedure Notes (Signed)
 Procedure Name: Intubation Date/Time: 09/20/2024 9:24 AM  Performed by: Erick Fitz, CRNAPre-anesthesia Checklist: Patient identified, Emergency Drugs available, Suction available, Patient being monitored and Timeout performed Patient Re-evaluated:Patient Re-evaluated prior to induction Oxygen Delivery Method: Circle system utilized Preoxygenation: Pre-oxygenation with 100% oxygen Induction Type: IV induction Ventilation: Mask ventilation without difficulty Laryngoscope Size: Mac and 3 Grade View: Grade I Tube type: Oral Tube size: 7.0 mm Number of attempts: 1 Airway Equipment and Method: Stylet Placement Confirmation: ETT inserted through vocal cords under direct vision, positive ETCO2, CO2 detector and breath sounds checked- equal and bilateral Secured at: 22 cm Tube secured with: Tape (secured with white silk 1/2 inch tape) Dental Injury: Teeth and Oropharynx as per pre-operative assessment  Comments: Patient edentulous upper

## 2024-09-20 NOTE — Transfer of Care (Signed)
 Immediate Anesthesia Transfer of Care Note  Patient: Veronica Hansen  Procedure(s) Performed: LAPAROSCOPIC CHOLECYSTECTOMY LYSIS, ADHESIONS, LAPAROSCOPIC  Patient Location: PACU  Anesthesia Type:General  Level of Consciousness: awake, alert , oriented, and patient cooperative  Airway & Oxygen Therapy: Patient Spontanous Breathing and Patient connected to face mask oxygen  Post-op Assessment: Report given to RN and Post -op Vital signs reviewed and stable  Post vital signs: Reviewed and stable  Last Vitals:  Vitals Value Taken Time  BP 127/68 09/20/24 11:08  Temp 36.6 C 09/20/24 11:08  Pulse 61 09/20/24 11:12  Resp 12 09/20/24 11:12  SpO2 94 % 09/20/24 11:12  Vitals shown include unfiled device data.  Last Pain:  Vitals:   09/20/24 0737  TempSrc:   PainSc: 0-No pain      Patients Stated Pain Goal: 0 (09/20/24 0727)  Complications: There were no known notable events for this encounter.

## 2024-09-20 NOTE — Op Note (Signed)
 Laparoscopic Cholecystectomy with ICG cholangiography  Indications: This patient presents with choledocholithiasis and will undergo laparoscopic cholecystectomy.  Pre-operative Diagnosis: choledocholithiasis  Post-operative Diagnosis: Same  Surgeon: ARON SHOULDERS   Assistants: Duwaine Birkenhead, RNFA  Anesthesia: General endotracheal anesthesia and local  ASA Class: 3  Procedure Details   The patient was seen again in the Holding Room. The risks, benefits, complications, treatment options, and expected outcomes were discussed with the patient. The possibilities of  bleeding, recurrent infection, damage to nearby structures, the need for additional procedures, failure to diagnose a condition, the possible need to convert to an open procedure, and creating a complication requiring transfusion or operation were discussed with the patient. The likelihood of improving the patient's symptoms with return to their baseline status is good.    The patient and/or family concurred with the proposed plan, giving informed consent. The site of surgery properly noted. The patient was taken to OR # 2 and placed supine on the OR table.  SCDs were placed. Antibiotic prophylaxis was administered. General endotracheal anesthesia was then administered and tolerated well. After the induction, the abdomen was prepped with Chloraprep and draped in the sterile fashion. and the procedure verified as Laparoscopic Cholecystectomy with ICG cholangiography. A Time Out was held and the above information confirmed.  Local anesthetic agent was injected into the skin in the upper midline where there was a prior  incision, and this was incised with a #11 blade. I dissected down to the abdominal fascia with blunt dissection.  The fascia was incised vertically and the peritoneal cavity was entered bluntly.  A pursestring suture of 0-Vicryl was placed around the fascial opening.  The Hasson cannula was inserted and secured with the stay  suture.  Pneumoperitoneum was then created with CO2 and tolerated well without any adverse changes in the patient's vital signs. An 11-mm port was placed in the subxiphoid position.  Two 5-mm ports were placed in the right upper quadrant. All skin incisions were infiltrated with a local anesthetic agent before making the incision and placing the trocars.   The patient was placed into reverse Trendelenburg position and was tilted slightly to the patient's left.  The gallbladder was identified, and there were many omental adhesions noted.  Adhesions were lysed bluntly and with the electrocautery where indicated, taking care not to injure any adjacent organs or viscus.  The fundus of the gallbladder was then grasped and retracted cephalad.  There were additional adhesions between the duodenum and the lower portion of the gallbladder.  These were divided bluntly and with cautery, taking care to avoid the duodenum.  At that point, the infundibulum was grasped and retracted laterally, exposing the peritoneum overlying the triangle of Calot. The cystic duct and cystic artery were identified.  These were both skeletonized.  A window between the gallbladder and the liver was obtained to ensure a critical view.  The cystic duct was overlying the cystic artery, so it had to be addressed first.  Near infrared light was used at this point to assess the cystic duct and common duct for the ICG cholangiography.  This had the classic appearance and we were high up on the cystic duct.  The cystic duct was then clipped once on the specimen side and thrice on the patient side.  The cystic artery was clipped once on the patient side and doubly clipped on the patient side.    The gallbladder was dissected from the liver bed in retrograde fashion with the electrocautery. The gallbladder was  removed and placed in a retrieval bag.  The gallbladder was extremely distended and filled the entire bag..    There were numerous adhesions  of the omentum to the supraumbilical port site region.  These were taken down as well.  The lysis of adhesions took around 20 minutes.  Once this was performed, it became apparent that there was a small hernia with incarcerated omentum that was directly adjacent to the port site.    The bag was then able to be removed through this site.  The skin had to be incised slightly more than the original incision to allow passage of the largest gallstone.  The liver bed was irrigated and inspected. Hemostasis was achieved with the electrocautery. Copious irrigation was utilized and was repeatedly aspirated until clear.  There was an area of omentum that was bleeding at the border of the stomach.  This had been adherent to the gallbladder.  This was clipped.  No cautery was used there in order to avoid injuring the stomach.  A 4 quadrant inspection was performed demonstrating no pooling of succus or blood in the abdomen.  A small place of fibrillar was placed over the gallbladder fossa and the region where the small bleeder had been on the edge of the stomach.  The supraumbilical port was removed at that point and direct visualization was used to assist closure.  Once the pursestring suture was tied back down, 2 additional 0 Vicryls were required to close the fascial port site.  At this point, there was no residual palpable fascial defect, and the incision was airtight.  Pneumoperitoneum was released as we removed the trocars.   4-0 Monocryl was used to close the skin in subcuticular fashion.   The skin was cleaned and dry, and Dermabond was applied. The patient was then extubated and brought to the recovery room in stable condition. Instrument, sponge, and needle counts were correct at closure and at the conclusion of the case.   Findings: Extremely large and distended gallbladder, fatty liver, small incisional hernia near our incision which was a prior port site.  Significant adhesions in the left upper quadrant  which were taken down in order to minimize risk of bowel obstruction.  Mild to moderate chronic inflammation of the gallbladder with omental adhesions.  Estimated Blood Loss: min         Drains: none          Specimens: Gallbladder to pathology       Complications: None; patient tolerated the procedure well.         Disposition: PACU - hemodynamically stable.         Condition: stable

## 2024-09-20 NOTE — Telephone Encounter (Signed)
-----   Message from Nurse Patty P sent at 06/20/2024  3:52 PM EDT ----- MRI/MRCP recall pancreatic cyst for October/November/December since we were unable to get fluid analysis

## 2024-09-21 ENCOUNTER — Encounter (HOSPITAL_COMMUNITY): Payer: Self-pay | Admitting: General Surgery

## 2024-09-21 LAB — SURGICAL PATHOLOGY

## 2024-09-21 NOTE — Anesthesia Postprocedure Evaluation (Signed)
 Anesthesia Post Note  Patient: Veronica Hansen  Procedure(s) Performed: LAPAROSCOPIC CHOLECYSTECTOMY LYSIS, ADHESIONS, LAPAROSCOPIC     Patient location during evaluation: PACU Anesthesia Type: General Level of consciousness: awake and alert Pain management: pain level controlled Vital Signs Assessment: post-procedure vital signs reviewed and stable Respiratory status: spontaneous breathing, nonlabored ventilation and respiratory function stable Cardiovascular status: blood pressure returned to baseline and stable Postop Assessment: no apparent nausea or vomiting Anesthetic complications: no   There were no known notable events for this encounter.               Neasia Fleeman

## 2024-09-25 DIAGNOSIS — J449 Chronic obstructive pulmonary disease, unspecified: Secondary | ICD-10-CM | POA: Diagnosis not present

## 2024-09-25 DIAGNOSIS — Z6841 Body Mass Index (BMI) 40.0 and over, adult: Secondary | ICD-10-CM | POA: Diagnosis not present

## 2024-09-25 DIAGNOSIS — F32A Depression, unspecified: Secondary | ICD-10-CM | POA: Diagnosis not present

## 2024-09-25 DIAGNOSIS — J1282 Pneumonia due to coronavirus disease 2019: Secondary | ICD-10-CM | POA: Diagnosis not present

## 2024-09-25 DIAGNOSIS — M109 Gout, unspecified: Secondary | ICD-10-CM | POA: Diagnosis not present

## 2024-09-25 DIAGNOSIS — I4892 Unspecified atrial flutter: Secondary | ICD-10-CM | POA: Diagnosis not present

## 2024-09-25 DIAGNOSIS — J9601 Acute respiratory failure with hypoxia: Secondary | ICD-10-CM | POA: Diagnosis not present

## 2024-09-25 DIAGNOSIS — U071 COVID-19: Secondary | ICD-10-CM | POA: Diagnosis not present

## 2024-09-25 DIAGNOSIS — G4733 Obstructive sleep apnea (adult) (pediatric): Secondary | ICD-10-CM | POA: Diagnosis not present

## 2024-09-25 DIAGNOSIS — I10 Essential (primary) hypertension: Secondary | ICD-10-CM | POA: Diagnosis not present

## 2024-09-25 DIAGNOSIS — E119 Type 2 diabetes mellitus without complications: Secondary | ICD-10-CM | POA: Diagnosis not present

## 2024-09-27 ENCOUNTER — Ambulatory Visit

## 2024-09-27 VITALS — BP 138/76 | HR 75 | Resp 18 | Ht 66.0 in | Wt 280.0 lb

## 2024-09-27 DIAGNOSIS — I7121 Aneurysm of the ascending aorta, without rupture: Secondary | ICD-10-CM | POA: Diagnosis not present

## 2024-09-27 NOTE — Patient Instructions (Signed)
 Risk Modification in those with ascending thoracic aortic aneurysm:   Continue control of blood pressure (prefer BP 130/80 or less)   2. Avoid fluoroquinolone antibiotics (I.e Ciprofloxacin, Avelox, Levofloxacin, Ofloxacin)   3.  Use of statin (to decrease cardiovascular risk)   4.  Exercise and activity limitations is individualized, but in general, contact sports are to be avoided and one should avoid heavy lifting (defined as half of ideal body weight) and exercises involving sustained Valsalva maneuver.   5.  Follow-up in one year with CTA chest.  OK to use a non-contrast CT if you have had a recent study for surveillance of the lung nodule.

## 2024-09-27 NOTE — Progress Notes (Signed)
 [address] [place] [address]             [phone]            Veronica Hansen [id number] October 27, 1946   History of Present Illness:  Veronica Hansen is a 78 year old female with medical history of atrial fibrillation/flutter, hypertension, type 2 diabetes, hyperlipidemia, OSA, and COPD who presents for continued follow up of ascending thoracic aortic aneurysm. Aneurysm has stayed stable in size and measured 4.5 cm on recent CTA of chest. Echocardiogram showed tri leaflet aortic valve.   She presents to the clinic today and reports that she has been doing ok.  She recently underwent cholecystectomy on 09/20/2024.  She has been recovering well from this procedure.  Her blood pressure is controlled with current medication therapy.  Home readings are 130s-140s/80s.  She has gain weight recently due to being unable to exercise from surgery. She hopes to start water  aerobics again.  She denies chest pain, shortness of breath and lower leg swelling.   Current Outpatient Medications on File Prior to Visit  Medication Sig Dispense Refill   albuterol  (PROVENTIL ) (2.5 MG/3ML) 0.083% nebulizer solution Take 3 mLs (2.5 mg total) by nebulization every 6 (six) hours as needed for wheezing or shortness of breath. 75 mL 12   albuterol  (VENTOLIN  HFA) 108 (90 Base) MCG/ACT inhaler Inhale 2 puffs into the lungs every 6 (six) hours as needed for wheezing or shortness of breath. 8 g 2   amLODipine  (NORVASC ) 5 MG tablet TAKE 1 TABLET BY MOUTH DAILY 90 tablet 3   benzonatate  (TESSALON ) 100 MG capsule Take 1 capsule (100 mg total) by mouth 3 (three) times daily as needed for cough. 20 capsule 0   cholecalciferol (VITAMIN D3) 25 MCG (1000 UNIT) tablet Take 1,000 Units by mouth daily.     escitalopram  (LEXAPRO ) 10 MG tablet Take 10 mg by mouth daily.     ezetimibe  (ZETIA ) 10 MG tablet Take 10 mg by mouth daily.     ferrous sulfate  325 (65 FE) MG EC tablet Take 325 mg by mouth  daily.     guaiFENesin (MUCINEX) 600 MG 12 hr tablet Take 1 tablet (600 mg total) by mouth 2 (two) times daily. 60 tablet 0   metoprolol  succinate (TOPROL -XL) 100 MG 24 hr tablet Take 100 mg by mouth daily.     Multiple Vitamins-Minerals (PRESERVISION AREDS 2 PO) Take 1 capsule by mouth in the morning and at bedtime.     olmesartan (BENICAR) 40 MG tablet Take 40 mg by mouth daily.     omeprazole  (PRILOSEC) 40 MG capsule Take 1 capsule (40 mg total) by mouth 2 (two) times daily before a meal. 60 capsule 12   oxyCODONE  (OXY IR/ROXICODONE ) 5 MG immediate release tablet Take 1 tablet (5 mg total) by mouth every 6 (six) hours as needed for severe pain (pain score 7-10). 12 tablet 0   Polyethyl Glycol-Propyl Glycol (SYSTANE OP) Place 1 drop into both eyes as needed (dry eyes).     simvastatin  (ZOCOR ) 40 MG tablet Take 40 mg by mouth daily.     apixaban (ELIQUIS) 5 MG TABS tablet Take 1 tablet (5 mg total) by mouth 2 (two) times daily. (Patient not taking: Reported on 09/27/2024) 60 tablet 11   No current facility-administered medications on file prior to visit.     ROS: Review of Systems  Constitutional: Negative.  Negative for malaise/fatigue.  Respiratory: Negative.  Negative for cough and shortness of breath.   Cardiovascular: Negative.  Negative for chest pain and leg swelling.     BP 138/76 (BP Location: Right Arm)   Pulse 75   Resp 18   Ht 5' 6 (1.676 m)   Wt 280 lb (127 kg)   SpO2 95%   BMI 45.19 kg/m   Physical Exam Constitutional:      Appearance: Normal appearance.  HENT:     Head: Normocephalic and atraumatic.  Cardiovascular:     Rate and Rhythm: Normal rate and regular rhythm.     Heart sounds: Normal heart sounds, S1 normal and S2 normal.  Pulmonary:     Effort: Pulmonary effort is normal.     Breath sounds: Normal breath sounds.  Musculoskeletal:     Cervical back: Normal range of motion.  Skin:    General: Skin is warm and dry.  Neurological:     General: No  focal deficit present.     Mental Status: She is alert.      Imaging: CLINICAL DATA:  Follow-up thoracic aortic aneurysm. Follow-up pneumonia.   EXAM: CT ANGIOGRAPHY CHEST WITH CONTRAST   TECHNIQUE: Multidetector CT imaging of the chest was performed using the standard protocol during bolus administration of intravenous contrast. Multiplanar CT image reconstructions and MIPs were obtained to evaluate the vascular anatomy.   RADIATION DOSE REDUCTION: This exam was performed according to the departmental dose-optimization program which includes automated exposure control, adjustment of the mA and/or kV according to patient size and/or use of iterative reconstruction technique.   CONTRAST:  75mL OMNIPAQUE  IOHEXOL  350 MG/ML SOLN   COMPARISON:  08/08/2024 and 07/14/2023   FINDINGS: Cardiovascular: Mild stable cardiomegaly. Mild calcified plaque over the 3 vessel coronary arteries. Pulmonary arterial system is normal. Bovine arch morphology.   Stable ascending thoracic aortic aneurysm measuring 4.5 cm in AP diameter.   Aortic root measures 3.5 cm and sinotubular junction 2.7 cm. Mid to distal aortic arch measures 3.2 cm. Descending thoracic aorta measures 3.6 cm. There is mild calcified plaque throughout the descending thoracic aorta.   Mediastinum/Nodes: No significant mediastinal or hilar adenopathy. Surgical suture line over the gastric cardia/fundus with sliding hiatal hernia present unchanged.   Lungs/Pleura: Lungs are adequately inflated. There is mild centrilobular emphysematous disease. Minimal linear scarring over the right base. Near complete resolution of the previously seen consolidation over the posteromedial left lower lobe with minimal residual linear density over the left lower lobe likely scarring/atelectasis. No effusion. Airways are unremarkable.   Upper Abdomen: Mild nodular contour to the liver which could be seen with cirrhosis. Cholelithiasis.  Multiple bilateral renal cysts incompletely evaluated on this exam. 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   Musculoskeletal: No focal abnormality.   Review of the MIP images confirms the above findings.   IMPRESSION: 1. Near complete resolution of the previously seen consolidation over the posteromedial left lower lobe with minimal residual linear density over the left lower lobe likely scarring/atelectasis. 2. 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 Ascending thoracic aortic aneurysm. Recommend semi-annual imaging followup by CTA or MRA and referral to cardiothoracic surgery if not already obtained. This recommendation follows 2010 ACCF/AHA/AATS/ACR/ASA/SCA/SCAI/SIR/STS/SVM Guidelines for the Diagnosis and Management of Patients With Thoracic Aortic Disease. Circulation. 2010; 121: Z733-z630. Aortic aneurysm NOS (ICD10-I71.9). 3. Mild centrilobular emphysematous disease. 4. Mild nodular contour to the liver  which could be seen with cirrhosis. 5. Cholelithiasis. 6. Multiple bilateral renal cysts incompletely evaluated on this exam. 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 7. Aortic atherosclerosis. Atherosclerotic coronary artery disease.   Aortic Atherosclerosis (ICD10-I70.0) and Emphysema (ICD10-J43.9).     Electronically Signed   By: Toribio Agreste M.D.   On: 09/19/2024 10:08       A/P: Aneurysm of ascending aorta without rupture -4.5 cm ascending thoracic aortic aneurysm on CTA of chest. Echocardiogram showed tricuspid aortic valve.  -We discussed the natural history and and risk factors for growth of ascending aortic aneurysms. Discussed recommendations to minimize the risk of further expansion or dissection including careful blood pressure control, avoidance of contact sports and heavy lifting,  attention to lipid management.  We covered the importance of continued smoking cessation.  The patient does not yet meet surgical criteria of >5.5cm. The patient is aware of signs and symptoms of aortic dissection and when to present to the emergency department    -Follow up in one year with CTA of chest for continued surveillance    Risk Modification:  Statin:  simvastatin    Smoking cessation instruction/counseling given:  commended patient for quitting and reviewed strategies for preventing relapses  Patient was counseled on importance of Blood Pressure Control  They are instructed to contact their Primary Care Physician if they start to have blood pressure readings over 130s/90s. Do not ever stop blood pressure medications on your own, unless instructed by healthcare professional.  Please avoid use of Fluoroquinolones as this can potentially increase your risk of Aortic Rupture and/or Dissection  Patient educated on signs and symptoms of Aortic Dissection, handout also provided in AVS  Manuelita CHRISTELLA Rough, PA-C 09/27/24

## 2024-10-02 DIAGNOSIS — J411 Mucopurulent chronic bronchitis: Secondary | ICD-10-CM | POA: Diagnosis not present

## 2024-10-06 ENCOUNTER — Ambulatory Visit

## 2024-10-09 ENCOUNTER — Ambulatory Visit: Admitting: Gastroenterology

## 2024-10-09 ENCOUNTER — Other Ambulatory Visit (INDEPENDENT_AMBULATORY_CARE_PROVIDER_SITE_OTHER)

## 2024-10-09 ENCOUNTER — Encounter: Payer: Self-pay | Admitting: Gastroenterology

## 2024-10-09 VITALS — BP 130/80 | HR 60 | Ht 66.0 in | Wt 281.0 lb

## 2024-10-09 DIAGNOSIS — K8020 Calculus of gallbladder without cholecystitis without obstruction: Secondary | ICD-10-CM | POA: Diagnosis not present

## 2024-10-09 DIAGNOSIS — D509 Iron deficiency anemia, unspecified: Secondary | ICD-10-CM

## 2024-10-09 DIAGNOSIS — K580 Irritable bowel syndrome with diarrhea: Secondary | ICD-10-CM

## 2024-10-09 DIAGNOSIS — K869 Disease of pancreas, unspecified: Secondary | ICD-10-CM | POA: Diagnosis not present

## 2024-10-09 DIAGNOSIS — K760 Fatty (change of) liver, not elsewhere classified: Secondary | ICD-10-CM | POA: Diagnosis not present

## 2024-10-09 LAB — CBC WITH DIFFERENTIAL/PLATELET
Basophils Absolute: 0.1 K/uL (ref 0.0–0.1)
Basophils Relative: 0.8 % (ref 0.0–3.0)
Eosinophils Absolute: 0.3 K/uL (ref 0.0–0.7)
Eosinophils Relative: 3.6 % (ref 0.0–5.0)
HCT: 39 % (ref 36.0–46.0)
Hemoglobin: 12.7 g/dL (ref 12.0–15.0)
Lymphocytes Relative: 26.5 % (ref 12.0–46.0)
Lymphs Abs: 2 K/uL (ref 0.7–4.0)
MCHC: 32.5 g/dL (ref 30.0–36.0)
MCV: 79.5 fl (ref 78.0–100.0)
Monocytes Absolute: 0.6 K/uL (ref 0.1–1.0)
Monocytes Relative: 7.9 % (ref 3.0–12.0)
Neutro Abs: 4.6 K/uL (ref 1.4–7.7)
Neutrophils Relative %: 61.2 % (ref 43.0–77.0)
Platelets: 246 K/uL (ref 150.0–400.0)
RBC: 4.9 Mil/uL (ref 3.87–5.11)
RDW: 15.6 % — ABNORMAL HIGH (ref 11.5–15.5)
WBC: 7.5 K/uL (ref 4.0–10.5)

## 2024-10-09 LAB — LIPASE: Lipase: 28 U/L (ref 11.0–59.0)

## 2024-10-09 LAB — COMPREHENSIVE METABOLIC PANEL WITH GFR
ALT: 11 U/L (ref 0–35)
AST: 16 U/L (ref 0–37)
Albumin: 4.3 g/dL (ref 3.5–5.2)
Alkaline Phosphatase: 85 U/L (ref 39–117)
BUN: 12 mg/dL (ref 6–23)
CO2: 30 meq/L (ref 19–32)
Calcium: 9.9 mg/dL (ref 8.4–10.5)
Chloride: 102 meq/L (ref 96–112)
Creatinine, Ser: 0.55 mg/dL (ref 0.40–1.20)
GFR: 89.1 mL/min (ref >60.00)
Glucose, Bld: 93 mg/dL (ref 70–99)
Potassium: 4 meq/L (ref 3.5–5.1)
Sodium: 139 meq/L (ref 135–145)
Total Bilirubin: 0.7 mg/dL (ref 0.2–1.2)
Total Protein: 7.2 g/dL (ref 6.0–8.3)

## 2024-10-09 NOTE — Patient Instructions (Signed)
 _______________________________________________________  If your blood pressure at your visit was 140/90 or greater, please contact your primary care physician to follow up on this.  _______________________________________________________  If you are age 76 or older, your body mass index should be between 23-30. Your Body mass index is 45.35 kg/m. If this is out of the aforementioned range listed, please consider follow up with your Primary Care Provider.  If you are age 43 or younger, your body mass index should be between 19-25. Your Body mass index is 45.35 kg/m. If this is out of the aformentioned range listed, please consider follow up with your Primary Care Provider.   ________________________________________________________  The [facility] providers would like to encourage you to use MYCHART to communicate with providers for non-urgent requests or questions.  Due to long hold times on the telephone, sending your provider a message by [facility] may be a faster and more efficient way to get a response.  Please allow 48 business hours for a response.  Please remember that this is for non-urgent requests.  _______________________________________________________  [facility] is using a team-based approach to care.  Your team is made up of your doctor and two to three APPS. Our APPS (Nurse Practitioners and Physician Assistants) work with your physician to ensure care continuity for you. They are fully qualified to address your health concerns and develop a treatment plan. They communicate directly with your gastroenterologist to care for you. Seeing the Advanced Practice Practitioners on your physician's team can help you by facilitating care more promptly, often allowing for earlier appointments, access to diagnostic testing, procedures, and other specialty referrals.   Your provider has requested that you go to the basement level for lab work before leaving today. Press B on the  elevator. The lab is located at the first door on the left as you exit the elevator.  Omeprazole  40mg  daily  Repeat MRI/MRCP for 08-2025. Please call 2 months prior to schedule this. A letter will be sent as it gets closer.  Thank you,  Dr. Lynnie Bring

## 2024-10-09 NOTE — Progress Notes (Signed)
 Chief Complaint: Abn MRCP  Referring Provider:  Yolande Toribio [name], MD      ASSESSMENT AND PLAN;   #1. Stable Pancreatic tail cystic lesion 2.6 cm x 2.2 cm with chronic calcific pancreatitis without PD dilatation on MRI/MRCP (Oct 2024). No evidence of exocrine insufficiency.   #2. Cholelithiasis s/p lap chole 09/20/2024, prior ERCP with ES/stone extraction 06/2024  #3. Fatty liver (with ?early cirrhosis)  #4. IDA (resolved). Neg colon 05/2022.   #5. IBS-D, now element of postchole diarrhea.   #6. GERD   Plan: -Reduce omeprazole  40mg  po QD -Rpt MRI with MRCP Oct 2026 -CBC, CMP, lipase, CA19-9 -Reduce wt. -FU thereafter.   HPI:    Veronica Hansen is a 78 y.o. 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  Recent MRI of the kidneys and pancreas showed a reduction in the size of a pancreatic cyst from 2.6 cm to 2.4 cm. A biopsy of the pancreatic cyst was performed, but the patient did not receive the result directly. She had her gallbladder removed on November 4th due to gallstones, one of which had previously obstructed the duct.  She has been taking omeprazole  40 mg twice daily for stomach inflammation. She associates some gastrointestinal symptoms with dietary choices, particularly chocolate, which she believes may cause a 'dumping syndrome'.  She has a history of weight fluctuations, having lost weight down to 240 lbs but has since regained weight, currently weighing 281 lbs. She attributes some of her weight management challenges to discontinuing Trulicity, which she was on for about a year and a half. She is concerned about the impact of her weight  on her fatty liver condition.  She experiences occasional loose stools but not diarrhea and is monitoring her diet to identify triggers. She does not take laxatives or magnesium. She has a history of internal hemorrhoids, which occasionally cause blood in her stool.  She has mild urinary incontinence, for which she uses pads, and reports no significant issues with her kidneys, as confirmed by recent imaging.  She is currently taking iron supplements following a previous iron transfusion, and her hemoglobin levels were stable at 12.8 three weeks ago.       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  With associated calcifications consistent with chronic pancreatitis.  Radiology recommends EUS/FNA, and repeat MRCP in 1 year -Has cholelithiasis -Hepatomegaly.  No definite cirrhosis or portal hypertension on MRI (CTA ?early cirrhosis)   Stool studies including fecal elastase were negative  No previous history of pancreatitis.  No history of significant alcohol  use.  No family history of pancreatic CA.  Wt Readings from Last 3 Encounters:  10/09/24 281 lb (127.5 kg)  09/27/24 280 lb (127 kg)  09/20/24 265 lb (120.2 kg)     Previous GI procedures: MRCP 08/2024 Narrative & Impression  CLINICAL DATA:  Renal  angiomyolipoma   EXAM: MRI ABDOMEN WITHOUT AND WITH CONTRAST   TECHNIQUE: Multiplanar multisequence MR imaging of the abdomen was performed both before and after the administration of intravenous contrast.   CONTRAST:  Vueway  10 ml   COMPARISON:  CT abdomen July 28 2023, MRI abdomen September 07, 2023.   FINDINGS: Lower chest:  Left lower lobe linear scarring/atelectasis.   Hepatobiliary: Hepatomegaly measuring 20.6 cm in craniocaudal dimension. No significant hepatic steatosis. Cholelithiasis. Suggestion of fundal gallbladder adenomyomatosis. No intrahepatic biliary dilatation. CBD is normal.   Pancreas: T2 hyperintense lesion with thick internal septation measuring 2.4 x 1.9 cm in pancreatic tail, previously measured 2.6 x 2.2 cm.No pancreatic ductal dilatation. There is peripheral rim and internal septation mild enhancement on postcontrast images.   Spleen: Normal in size without focal abnormality.   Adrenals/Urinary Tract: Adrenal glands are unremarkable.   Bilateral T2 hyperintense simple renal cortical cysts.   There is a right kidney superolateral cortical T1 hyperintense T2 hypointense fat containing lesion measuring 3.8 cm with hairline internal septations and signal drop on out of phase sequence, correlating to fat containing lesion on prior CT previously measured 4.6 cm. This lesion was previously measured 5.1 x 5.2 cm on prior MRI. No significant postcontrast enhancement or new solid component.   There is a T2 hypointense 5 mm in cyst in the right kidney lower pole, likely a cyst containing proteinaceous/hemorrhagic products. No hydronephrosis. Duplicated left collecting system.   Stomach/Bowel: Status post sleeve gastrectomy. Small hiatal hernia. Appendix appears normal. No evidence of bowel wall thickening, distention, or inflammatory changes.   Vascular/Lymphatic: No significant vascular findings are present. No enlarged abdominal or pelvic lymph nodes.   Reproductive: Uterus and bilateral adnexa are unremarkable.   Other: No abdominal wall hernia or abnormality. No abdominopelvic ascites.   Musculoskeletal: No acute or significant osseous findings.   IMPRESSION: Interval decrease in size of the fat containing lesion in right kidney superolateral cortex without significant  enhancement most consistent with benign lesion such as angiomyolipoma. Given the decrease in size and lack of enhancement, fat containing renal cell carcinoma is unlikely. Follow-up to ensure stability.   Additional simple and complex renal cortical cysts are stable. No imaging follow-up required.   Pancreatic tail septated cystic lesion, stable to prior which may represent a cystic neoplasia versus a complex pseudocyst. No pancreatic ductal dilatation. Recommend continued attention on follow-up exams.   Cholelithiasis.      Colon 05/2022 - One 6 mm polyp in the proximal ascending colon, removed with a cold snare. Resected and retrieved. - Minimal sigmoid diverticulosis. - Non-bleeding internal hemorrhoids. -Bx- TA.  No need to repeat due to age -Negative random colon biopsies for microscopic colitis.  ERCP with ES status stone extraction 06/26/2024  EUS 04/24/2024 EUS impression: - A cystic lesion was seen in the pancreatic tail. Cytology results are pending. 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 Fine needle aspiration for fluid performed. - Pancreatic parenchymal abnormalities consisting of hyperechoic strands were noted in the entire pancreas. - Main pancreatic duct ( MPD) diameter was measured. Endosonographically, the MPD had a normal appearance. - One stone and significant biliary sludge was visualized endosonographically in the gallbladder. - One 3. 5 mm stone was visualized endosonographically in the common bile duct. - No malignant- appearing lymph nodes were visualized in the celiac region ( level 20) , peripancreatic region and porta hepatis region.  Past Medical History:  Diagnosis Date   A-fib (HCC)  A-fib (HCC) 07/2018   treated with ablation procedure in October 2019   Allergic rhinitis    Anemia    Arthritis    joints hurt   ASCVD (arteriosclerotic cardiovascular disease)    Atypical chest pain     cardiology consult showed an echocardiogram with left ventricular hypertrophy normal left ventricular function.   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   COPD (chronic obstructive pulmonary disease) (HCC)    Depression    husband died of Covid 04-Nov-2020   Diabetes mellitus    type 2, no longer takes medication    Diabetes mellitus (HCC)    Dyspnea    GERD (gastroesophageal reflux disease)    History of blood transfusion    HTN (hypertension)    Hypercholesterolemia    Hyperlipidemia    Hypertension    Incontinence    Iron deficiency anemia    Lower esophageal sphincter, relaxation    Morbid obesity (HCC)    OSA (obstructive sleep apnea)    OSA on CPAP 03/07/2014   OSA on CPAP    Proteinuria 2001   Psoriasis    RLL pneumonia 06/2016   Runny nose    seasonal - poss allergies   Sinus problem    SOB (shortness of breath)    Tobacco use    Stopped in November 05, 2003    Past Surgical History:  Procedure Laterality Date   A-FLUTTER ABLATION N/A 08/19/2018   Procedure: A-FLUTTER ABLATION;  Surgeon: Waddell Danelle ORN, MD;  Location: [facility];  Service: Cardiovascular;  Laterality: N/A;   ATRIAL FLUTTER ABLATION     BARIATRIC SURGERY  2012/11/04   CHOLECYSTECTOMY N/A 09/20/2024   Procedure: LAPAROSCOPIC CHOLECYSTECTOMY;  Surgeon: Aron Shoulders, MD;  Location: [facility] OR;  Service: General;  Laterality: N/A;   dental implants  11/16/2009   ERCP N/A 06/26/2024   Procedure: ERCP, WITH INTERVENTION IF INDICATED;  Surgeon: Charlanne Groom, MD;  Location: [facility]OS[facility];  Service: Gastroenterology;  Laterality: N/A;   ESOPHAGOGASTRODUODENOSCOPY N/A 04/24/2024   Procedure: EGD (ESOPHAGOGASTRODUODENOSCOPY);  Surgeon: Wilhelmenia Aloha Raddle., MD;  Location: [facility];  Service: Gastroenterology;  Laterality: N/A;   EUS N/A 04/24/2024   Procedure: ULTRASOUND, UPPER GI TRACT, ENDOSCOPIC;  Surgeon: Wilhelmenia Aloha Raddle., MD;  Location: [facility] ENDOSCOPY;  Service: Gastroenterology;  Laterality: N/A;   FINE NEEDLE  ASPIRATION  04/24/2024   Procedure: FINE NEEDLE ASPIRATION;  Surgeon: Wilhelmenia Aloha Raddle., MD;  Location: [facility];  Service: Gastroenterology;;   IR EMBO TUMOR ORGAN ISCHEMIA INFARCT INC GUIDE ROADMAPPING  01/21/2024   IR RADIOLOGIST EVAL & MGMT  12/27/2023   IR RADIOLOGIST EVAL & MGMT  09/18/2024   IR RENAL SUPRASEL UNI S&I MOD SED  01/21/2024   IR RENAL SUPRASEL UNI S&I MOD SED  01/21/2024   IR US  GUIDE VASC ACCESS RIGHT  01/21/2024   LAPAROSCOPIC LYSIS OF ADHESIONS  09/20/2024   Procedure: LYSIS, ADHESIONS, LAPAROSCOPIC;  Surgeon: Aron Shoulders, MD;  Location: [facility] OR;  Service: General;;   LARYNX SURGERY     polyp removed   left breast bx was fibrocystic change     left cataract extraction and lens implantation  01/11/2019   ORIF ULNAR FRACTURE Right 02/15/2021   Procedure: OPEN REDUCTION INTERNAL FIXATION (ORIF) ULNAR FRACTURE and distal radius open reduction and internal fixation and repair;  Surgeon: Shari Easter, MD;  Location: [facility] OR;  Service: Orthopedics;  Laterality: Right;  with IV sedation   Right TKR  11-04-2006   TOTAL HIP ARTHROPLASTY  Right 06/03/2023   Procedure: TOTAL HIP ARTHROPLASTY ANTERIOR APPROACH;  Surgeon: Ernie Cough, MD;  Location: [facility] ORS;  Service: Orthopedics;  Laterality: Right;   TOTAL KNEE ARTHROPLASTY  11/17/2003   left   TOTAL KNEE ARTHROPLASTY  11/16/2005   right    Family History  Problem Relation Age of Onset   Diabetes Father    Diabetes Brother    Colon cancer Neg Hx    Rectal cancer Neg Hx    Stomach cancer Neg Hx     Social History   Tobacco Use   Smoking status: Former    Current packs/day: 0.00    Types: Cigarettes    Start date: 08/19/1980    Quit date: 08/19/2000    Years since quitting: 24.1   Smokeless tobacco: Never   Tobacco comments:    Smoked 10 years, about 1.5ppd  Vaping Use   Vaping status: Never Used  Substance Use Topics   Alcohol  use: No    Comment: Rare   Drug use: No    Current Outpatient Medications  Medication Sig  Dispense Refill   albuterol  (PROVENTIL ) (2.5 MG/3ML) 0.083% nebulizer solution Take 3 mLs (2.5 mg total) by nebulization every 6 (six) hours as needed for wheezing or shortness of breath. 75 mL 12   albuterol  (VENTOLIN  HFA) 108 (90 Base) MCG/ACT inhaler Inhale 2 puffs into the lungs every 6 (six) hours as needed for wheezing or shortness of breath. 8 g 2   amLODipine  (NORVASC ) 5 MG tablet TAKE 1 TABLET BY MOUTH DAILY 90 tablet 3   cholecalciferol (VITAMIN D3) 25 MCG (1000 UNIT) tablet Take 1,000 Units by mouth daily.     escitalopram  (LEXAPRO ) 10 MG tablet Take 10 mg by mouth daily.     ezetimibe  (ZETIA ) 10 MG tablet Take 10 mg by mouth daily.     ferrous sulfate  325 (65 FE) MG EC tablet Take 325 mg by mouth daily.     metoprolol  succinate (TOPROL -XL) 100 MG 24 hr tablet Take 100 mg by mouth daily.     olmesartan (BENICAR) 40 MG tablet Take 40 mg by mouth daily.     omeprazole  (PRILOSEC) 40 MG capsule Take 1 capsule (40 mg total) by mouth 2 (two) times daily before a meal. 60 capsule 12   simvastatin  (ZOCOR ) 40 MG tablet Take 40 mg by mouth daily.     apixaban  (ELIQUIS ) 5 MG TABS tablet Take 1 tablet (5 mg total) by mouth 2 (two) times daily. (Patient not taking: Reported on 10/09/2024) 60 tablet 11   benzonatate  (TESSALON ) 100 MG capsule Take 1 capsule (100 mg total) by mouth 3 (three) times daily as needed for cough. (Patient not taking: Reported on 10/09/2024) 20 capsule 0   Multiple Vitamins-Minerals (PRESERVISION AREDS 2 PO) Take 1 capsule by mouth in the morning and at bedtime. (Patient not taking: Reported on 10/09/2024)     oxyCODONE  (OXY IR/ROXICODONE ) 5 MG immediate release tablet Take 1 tablet (5 mg total) by mouth every 6 (six) hours as needed for severe pain (pain score 7-10). (Patient not taking: Reported on 10/09/2024) 12 tablet 0   Polyethyl Glycol-Propyl Glycol (SYSTANE OP) Place 1 drop into both eyes as needed (dry eyes). (Patient not taking: Reported on 10/09/2024)     No current  facility-administered medications for this visit.    Allergies  Allergen Reactions   Clindamycin/Lincomycin Itching   Orlistat Diarrhea    Other reaction(s): severe diarrhea   Phentermine Hcl     Other reaction(s):  elevated BP   Quinolones Other (See Comments)    Ascending aortic aneurysm, use with caution     Review of Systems:  neg     Physical Exam:    BP 130/80   Pulse 60   Ht 5' 6 (1.676 m)   Wt 281 lb (127.5 kg)   BMI 45.35 kg/m  Wt Readings from Last 3 Encounters:  10/09/24 281 lb (127.5 kg)  09/27/24 280 lb (127 kg)  09/20/24 265 lb (120.2 kg)   Constitutional:  Well-developed, in no acute distress. Psychiatric: Normal mood and affect. Behavior is normal. HEENT: Pupils normal.  Conjunctivae are normal. No scleral icterus. Cardiovascular: Normal rate, regular rhythm. No edema Pulmonary/chest: Effort normal and breath sounds normal. No wheezing, rales or rhonchi. Abdominal: Soft, nondistended. Nontender. Bowel sounds active throughout. There are no masses palpable. No hepatomegaly.  Rectal: Deferred Neurological: Alert and oriented to person place and time. Skin: Skin is warm and dry. No rashes noted.  Data Reviewed: I have personally reviewed following labs and imaging studies  CBC:    Latest Ref Rng & Units 09/12/2024    9:25 AM 08/16/2024    1:59 AM 08/15/2024    4:49 AM  CBC  WBC 4.0 - 10.5 K/uL 5.9  12.5  14.2   Hemoglobin 12.0 - 15.0 g/dL 87.1  87.8  87.0   Hematocrit 36.0 - 46.0 % 41.2  38.5  40.6   Platelets 150 - 400 K/uL 207  275  299     CMP:    Latest Ref Rng & Units 09/12/2024    9:25 AM 08/16/2024    1:59 AM 08/15/2024    4:49 AM  CMP  Glucose 70 - 99 mg/dL 97  889  98   BUN 8 - 23 mg/dL 10  23  22    Creatinine 0.44 - 1.00 mg/dL 9.44  9.29  9.38   Sodium 135 - 145 mmol/L 139  136  139   Potassium 3.5 - 5.1 mmol/L 3.9  4.1  4.1   Chloride 98 - 111 mmol/L 103  100  103   CO2 22 - 32 mmol/L 27  25  26    Calcium 8.9 - 10.3 mg/dL 9.2   8.8  8.9        Radiology Studies: CT ANGIO CHEST AORTA W/CM & OR WO/CM Result Date: 09/19/2024 CLINICAL DATA:  Follow-up thoracic aortic aneurysm. Follow-up pneumonia. EXAM: CT ANGIOGRAPHY CHEST WITH CONTRAST TECHNIQUE: Multidetector CT imaging of the chest was performed using the standard protocol during bolus administration of intravenous contrast. Multiplanar CT image reconstructions and MIPs were obtained to evaluate the vascular anatomy. RADIATION DOSE REDUCTION: This exam was performed according to the departmental dose-optimization program which includes automated exposure control, adjustment of the mA and/or kV according to patient size and/or use of iterative reconstruction technique. CONTRAST:  75mL OMNIPAQUE  IOHEXOL  350 MG/ML SOLN COMPARISON:  08/08/2024 and 07/14/2023 FINDINGS: Cardiovascular: Mild stable cardiomegaly. Mild calcified plaque over the 3 vessel coronary arteries. Pulmonary arterial system is normal. Bovine arch morphology. Stable ascending thoracic aortic aneurysm measuring 4.5 cm in AP diameter. Aortic root measures 3.5 cm and sinotubular junction 2.7 cm. Mid to distal aortic arch measures 3.2 cm. Descending thoracic aorta measures 3.6 cm. There is mild calcified plaque throughout the descending thoracic aorta. Mediastinum/Nodes: No significant mediastinal or hilar adenopathy. Surgical suture line over the gastric cardia/fundus with sliding hiatal hernia present unchanged. Lungs/Pleura: Lungs are adequately inflated. There is mild centrilobular emphysematous disease. Minimal linear scarring  over the right base. Near complete resolution of the previously seen consolidation over the posteromedial left lower lobe with minimal residual linear density over the left lower lobe likely scarring/atelectasis. No effusion. Airways are unremarkable. Upper Abdomen: Mild nodular contour to the liver which could be seen with cirrhosis. Cholelithiasis. Multiple bilateral renal cysts incompletely  evaluated on this exam. 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 Musculoskeletal: No focal abnormality. Review of the MIP images confirms the above findings. IMPRESSION: 1. Near complete resolution of the previously seen consolidation over the posteromedial left lower lobe with minimal residual linear density over the left lower lobe likely scarring/atelectasis. 2. 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 Ascending thoracic aortic aneurysm. Recommend semi-annual imaging followup by CTA or MRA and referral to cardiothoracic surgery if not already obtained. This recommendation follows 2010 ACCF/AHA/AATS/ACR/ASA/SCA/SCAI/SIR/STS/SVM Guidelines for the Diagnosis and Management of Patients With Thoracic Aortic Disease. Circulation. 2010; 121: [id number]. Aortic aneurysm NOS (ICD10-I71.9). 3. Mild centrilobular emphysematous disease. 4. Mild nodular contour to the liver which could be seen with cirrhosis. 5. Cholelithiasis. 6. Multiple bilateral renal cysts incompletely evaluated on this exam. 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 7. Aortic atherosclerosis. Atherosclerotic coronary artery disease. Aortic Atherosclerosis (ICD10-I70.0) and Emphysema (ICD10-J43.9). Electronically Signed   By: Toribio Agreste M.D.   On: 09/19/2024 10:08   IR Radiologist Eval & Mgmt Result Date: 09/18/2024 CLINICAL DATA:  IR clinic follow up. EXAM: IR EVAL AND MANAGEMENT COMPARISON:  None Available. FINDINGS: See dictated note in Epic. IMPRESSION: See dictated note in Epic. Electronically Signed   By: Marcey Moan M.D.   On: 09/18/2024 16:47      Anselm Bring, MD 10/09/2024, 1:36 PM  Cc: Yolande Toribio [name], MD

## 2024-10-10 ENCOUNTER — Ambulatory Visit: Payer: Self-pay | Admitting: Gastroenterology

## 2024-10-10 LAB — CANCER ANTIGEN 19-9: CA 19-9: 34 U/mL — ABNORMAL HIGH (ref <34)

## 2024-10-14 ENCOUNTER — Encounter: Payer: Self-pay | Admitting: Gastroenterology

## 2024-10-16 NOTE — Telephone Encounter (Signed)
 Patient is requesting if it is ok for her to be on GLP-1 medication for her weight. Please advise

## 2024-10-23 NOTE — Telephone Encounter (Signed)
 I would recomm 3 week Zio patch to look for intermittent atrial fibrillation now that she has fully recovered from illnesses and surgery

## 2024-10-24 ENCOUNTER — Ambulatory Visit: Attending: Internal Medicine

## 2024-10-24 DIAGNOSIS — I4892 Unspecified atrial flutter: Secondary | ICD-10-CM

## 2024-10-24 NOTE — Progress Notes (Unsigned)
 Enrolled patient for a 14 day Zio XT  monitor to be mailed to patients home

## 2024-11-15 ENCOUNTER — Ambulatory Visit

## 2024-11-27 NOTE — Progress Notes (Signed)
 "  New Patient Pulmonology Office Visit   Subjective:  Patient ID: Veronica Hansen, female    DOB: 18-May-1946  MRN: [id number]  Referred by: Yolande Toribio [name], MD  CC: No chief complaint on file.   HPI Veronica Hansen is a 79 y.o. female without significant pulmonary history who was recently discharged on 08/16/2024 with acute hypoxic respiratory failure secondary to COVID and bacterial pneumonia.  Patient report she has been having shortness of breath for years but it has been stable and she was thought to be related to her weight.  After discharge, her dyspnea has improved however she is not back to normal.  She has significant productive cough. Breztri inhaler was recently prescribed and has not make any difference.  She uses albuterol  inhaler 3 times in the last 2 weeks.  Patient denied any chronic pulmonary conditions.  She has a history of smoking for 10 years, 1.5 PPD and she has quitted in 2000.  In the past she has used breztri for 6 months, without any improvement of h on her dyspnea.  She has a  ROS as above  Allergies: Clindamycin/lincomycin, Orlistat, Phentermine hcl, and Quinolones  Current Outpatient Medications:    albuterol  (PROVENTIL ) (2.5 MG/3ML) 0.083% nebulizer solution, Take 3 mLs (2.5 mg total) by nebulization every 6 (six) hours as needed for wheezing or shortness of breath., Disp: 75 mL, Rfl: 12   albuterol  (VENTOLIN  HFA) 108 (90 Base) MCG/ACT inhaler, Inhale 2 puffs into the lungs every 6 (six) hours as needed for wheezing or shortness of breath., Disp: 8 g, Rfl: 2   amLODipine  (NORVASC ) 5 MG tablet, TAKE 1 TABLET BY MOUTH DAILY, Disp: 90 tablet, Rfl: 3   apixaban  (ELIQUIS ) 5 MG TABS tablet, Take 1 tablet (5 mg total) by mouth 2 (two) times daily. (Patient not taking: Reported on 10/09/2024), Disp: 60 tablet, Rfl: 11   benzonatate  (TESSALON ) 100 MG capsule, Take 1 capsule (100 mg total) by mouth 3 (three) times daily as needed for cough. (Patient not taking:  Reported on 10/09/2024), Disp: 20 capsule, Rfl: 0   cholecalciferol (VITAMIN D3) 25 MCG (1000 UNIT) tablet, Take 1,000 Units by mouth daily., Disp: , Rfl:    escitalopram  (LEXAPRO ) 10 MG tablet, Take 10 mg by mouth daily., Disp: , Rfl:    ezetimibe  (ZETIA ) 10 MG tablet, Take 10 mg by mouth daily., Disp: , Rfl:    ferrous sulfate  325 (65 FE) MG EC tablet, Take 325 mg by mouth daily., Disp: , Rfl:    metoprolol  succinate (TOPROL -XL) 100 MG 24 hr tablet, Take 100 mg by mouth daily., Disp: , Rfl:    Multiple Vitamins-Minerals (PRESERVISION AREDS 2 PO), Take 1 capsule by mouth in the morning and at bedtime. (Patient not taking: Reported on 10/09/2024), Disp: , Rfl:    olmesartan (BENICAR) 40 MG tablet, Take 40 mg by mouth daily., Disp: , Rfl:    omeprazole  (PRILOSEC) 40 MG capsule, Take 1 capsule (40 mg total) by mouth 2 (two) times daily before a meal., Disp: 60 capsule, Rfl: 12   oxyCODONE  (OXY IR/ROXICODONE ) 5 MG immediate release tablet, Take 1 tablet (5 mg total) by mouth every 6 (six) hours as needed for severe pain (pain score 7-10). (Patient not taking: Reported on 10/09/2024), Disp: 12 tablet, Rfl: 0   Polyethyl Glycol-Propyl Glycol (SYSTANE OP), Place 1 drop into both eyes as needed (dry eyes). (Patient not taking: Reported on 10/09/2024), Disp: , Rfl:    simvastatin  (ZOCOR ) 40 MG tablet, Take  40 mg by mouth daily., Disp: , Rfl:  Past Medical History:  Diagnosis Date   A-fib (HCC)    A-fib (HCC) 07/2018   treated with ablation procedure in October 2019   Allergic rhinitis    Anemia    Arthritis    joints hurt   ASCVD (arteriosclerotic cardiovascular disease)    Atypical chest pain    cardiology consult showed an echocardiogram with left ventricular hypertrophy normal left ventricular function.   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   COPD (chronic obstructive pulmonary disease) (HCC)    Depression    husband died of Covid 12-22-2019   Diabetes mellitus    type 2, no longer takes medication     Diabetes mellitus (HCC)    Dyspnea    GERD (gastroesophageal reflux disease)    History of blood transfusion    HTN (hypertension)    Hypercholesterolemia    Hyperlipidemia    Hypertension    Incontinence    Iron deficiency anemia    Lower esophageal sphincter, relaxation    Morbid obesity (HCC)    OSA (obstructive sleep apnea)    OSA on CPAP 03/07/2014   OSA on CPAP    Proteinuria 2001   Psoriasis    RLL pneumonia 06/2016   Runny nose    seasonal - poss allergies   Sinus problem    SOB (shortness of breath)    Tobacco use    Stopped in 12-21-2002   Past Surgical History:  Procedure Laterality Date   A-FLUTTER ABLATION N/A 08/19/2018   Procedure: A-FLUTTER ABLATION;  Surgeon: Waddell Danelle ORN, MD;  Location: [facility];  Service: Cardiovascular;  Laterality: N/A;   ATRIAL FLUTTER ABLATION     BARIATRIC SURGERY  December 22, 2011   CHOLECYSTECTOMY N/A 09/20/2024   Procedure: LAPAROSCOPIC CHOLECYSTECTOMY;  Surgeon: Aron Shoulders, MD;  Location: [facility] OR;  Service: General;  Laterality: N/A;   dental implants  11/16/2009   ERCP N/A 06/26/2024   Procedure: ERCP, WITH INTERVENTION IF INDICATED;  Surgeon: Charlanne Groom, MD;  Location: [facility] ENDOSCOPY;  Service: Gastroenterology;  Laterality: N/A;   ESOPHAGOGASTRODUODENOSCOPY N/A 04/24/2024   Procedure: EGD (ESOPHAGOGASTRODUODENOSCOPY);  Surgeon: Wilhelmenia Aloha Raddle., MD;  Location: [facility];  Service: Gastroenterology;  Laterality: N/A;   EUS N/A 04/24/2024   Procedure: ULTRASOUND, UPPER GI TRACT, ENDOSCOPIC;  Surgeon: Wilhelmenia Aloha Raddle., MD;  Location: [facility] ENDOSCOPY;  Service: Gastroenterology;  Laterality: N/A;   FINE NEEDLE ASPIRATION  04/24/2024   Procedure: FINE NEEDLE ASPIRATION;  Surgeon: Wilhelmenia Aloha Raddle., MD;  Location: [facility];  Service: Gastroenterology;;   IR EMBO TUMOR ORGAN ISCHEMIA INFARCT INC GUIDE ROADMAPPING  01/21/2024   IR RADIOLOGIST EVAL & MGMT  12/27/2023   IR RADIOLOGIST EVAL & MGMT  09/18/2024   IR RENAL  SUPRASEL UNI S&I MOD SED  01/21/2024   IR RENAL SUPRASEL UNI S&I MOD SED  01/21/2024   IR US  GUIDE VASC ACCESS RIGHT  01/21/2024   LAPAROSCOPIC LYSIS OF ADHESIONS  09/20/2024   Procedure: LYSIS, ADHESIONS, LAPAROSCOPIC;  Surgeon: Aron Shoulders, MD;  Location: [facility] OR;  Service: General;;   LARYNX SURGERY     polyp removed   left breast bx was fibrocystic change     left cataract extraction and lens implantation  01/11/2019   ORIF ULNAR FRACTURE Right 02/15/2021   Procedure: OPEN REDUCTION INTERNAL FIXATION (ORIF) ULNAR FRACTURE and distal radius open reduction and internal fixation and repair;  Surgeon: Shari Easter, MD;  Location: [facility] OR;  Service: Orthopedics;  Laterality: Right;  with IV sedation   Right TKR  2007   TOTAL HIP ARTHROPLASTY Right 06/03/2023   Procedure: TOTAL HIP ARTHROPLASTY ANTERIOR APPROACH;  Surgeon: Ernie Cough, MD;  Location: [facility] ORS;  Service: Orthopedics;  Laterality: Right;   TOTAL KNEE ARTHROPLASTY  11/17/2003   left   TOTAL KNEE ARTHROPLASTY  11/16/2005   right   Family History  Problem Relation Age of Onset   Diabetes Father    Diabetes Brother    Colon cancer Neg Hx    Rectal cancer Neg Hx    Stomach cancer Neg Hx    Social History   Socioeconomic History   Marital status: Widowed    Spouse name: Not on file   Number of children: 3   Years of education: Not on file   Highest education level: Bachelor's degree (e.g., BA, AB, BS)  Occupational History   Occupation: Retired  Tobacco Use   Smoking status: Former    Current packs/day: 0.00    Types: Cigarettes    Start date: 08/19/1980    Quit date: 08/19/2000    Years since quitting: 24.2   Smokeless tobacco: Never   Tobacco comments:    Smoked 10 years, about 1.5ppd  Vaping Use   Vaping status: Never Used  Substance and Sexual Activity   Alcohol  use: No    Comment: Rare   Drug use: No   Sexual activity: Not Currently    Comment: Quit ~ 2007  Other Topics Concern   Not on file  Social History  Narrative   Not on file   Social Drivers of Health   Tobacco Use: Medium Risk (10/16/2024)   Received from [facility]   Patient History    Smoking Tobacco Use: Former    Smokeless Tobacco Use: Never    Passive Exposure: Not on Actuary Strain: Not on file  Food Insecurity: No Food Insecurity (08/08/2024)   Epic    Worried About Radiation Protection Practitioner of Food in the Last Year: Never true    Ran Out of Food in the Last Year: Never true  Transportation Needs: No Transportation Needs (08/08/2024)   Epic    Lack of Transportation (Medical): No    Lack of Transportation (Non-Medical): No  Physical Activity: Not on file  Stress: Not on file  Social Connections: Moderately Integrated (08/08/2024)   Social Connection and Isolation Panel    Frequency of Communication with Friends and Family: Three times a week    Frequency of Social Gatherings with Friends and Family: More than three times a week    Attends Religious Services: More than 4 times per year    Active Member of Clubs or Organizations: Yes    Attends Banker Meetings: More than 4 times per year    Marital Status: Widowed  Intimate Partner Violence: Not At Risk (08/08/2024)   Epic    Fear of Current or Ex-Partner: No    Emotionally Abused: No    Physically Abused: No    Sexually Abused: No  Depression (PHQ2-9): Low Risk (09/06/2023)   Depression (PHQ2-9)    PHQ-2 Score: 0  Alcohol  Screen: Not on file  Housing: Low Risk (08/08/2024)   Epic    Unable to Pay for Housing in the Last Year: No    Number of Times Moved in the Last Year: 0    Homeless in the Last Year: No  Utilities: Not At Risk (08/08/2024)  Epic    Threatened with loss of utilities: No  Health Literacy: Not on file       Objective:  There were no vitals taken for this visit.   Physical Exam Constitutional:      Appearance: Normal appearance.  HENT:     Head: Normocephalic.     Nose: Nose normal.  Eyes:      Extraocular Movements: Extraocular movements intact.     Pupils: Pupils are equal, round, and reactive to light.  Cardiovascular:     Rate and Rhythm: Normal rate and regular rhythm.  Pulmonary:     Effort: Pulmonary effort is normal.     Breath sounds: Normal breath sounds.     Comments: Some decreased lung sounds.  Musculoskeletal:        General: Normal range of motion.  Neurological:     General: No focal deficit present.     Mental Status: She is alert and oriented to person, place, and time. Mental status is at baseline.     Diagnostic Review:   CT chest 08/08/2024 1. Dense airspace consolidation filling the majority of the left lower lobe with regions of mucous plugging in the bilateral lower lobe bronchi, most likely reflecting pneumonia. No parapneumonic effusion. A 2 view chest radiograph in 6-12 weeks is recommended to document resolution once treatment is complete. 2. Interval enlargement of a couple of bilateral hilar lymph nodes measuring up to 1.6 cm on the left, likely reactive. 3. 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 4. Cholecystolithiasis.  CT angio chest 09/19/24 1. Near complete resolution of the previously seen consolidation over the posteromedial left lower lobe with minimal residual linear density over the left lower lobe likely scarring/atelectasis. 2. 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 Ascending thoracic aortic aneurysm. 3. Mild centrilobular emphysematous disease. 4. Mild nodular contour to the liver which could be seen with cirrhosis.  PFTs  11/2022 FVC    3/95% FEV1  2/87% F/F       69% TLC   6.7/123% RV     3.4/142% DLCO 17.45/82%  11/2022: Minimal obtruction with overinflation, normal diffusion. No significant response to BD.    Assessment & Plan:   Assessment & Plan Mucopurulent chronic bronchitis (HCC)     79 years old who was recently admitted for acute hypoxic respiratory failure secondary  to bacterial infection and COVID-pneumonia.   She has been already treated with 7 to 8 days of antibiotics. After discharge her main issue has been productive cough.  She feels Lorraine is not making any difference in her symptoms.  She has chronic dyspnea exertion stable for years.  Her CT scan shows multiple mucous plugging airways.   For chronic bronchitis: -Albuterol  twice a day for 2 weeks -Mucinex  Orders:   Flutter valve; Future   For home use only DME Nebulizer machine CT chest is already ordered in November, I will review images in the next appt. I highly encouraged to continue to exercise daily. Dyspnea on exertion     Mild obstruction on PFTs Remote history of smoking Chronic Dyspnea on exertion  Patient denied any history of COPD in the past.  Denied any flares or frequent cough with mucus production.  Her PFT for last year did show minimal obstruction with overinflation, without significant response to bronchodilator. She feels Lorraine is not working for her and it does not make any difference in her symptoms.  We will continue to follow-up if she needs a LAMA/LABA.  No follow-ups on file.    Marny Patch, MD Pulmonary and Critical Care Medicine [facility], "

## 2024-11-28 ENCOUNTER — Ambulatory Visit

## 2024-11-28 VITALS — BP 116/70 | HR 60 | Temp 97.7°F | Ht 67.0 in | Wt 286.2 lb

## 2024-11-28 DIAGNOSIS — J411 Mucopurulent chronic bronchitis: Secondary | ICD-10-CM

## 2024-11-28 DIAGNOSIS — J4489 Other specified chronic obstructive pulmonary disease: Secondary | ICD-10-CM | POA: Diagnosis not present

## 2024-11-28 DIAGNOSIS — Z87891 Personal history of nicotine dependence: Secondary | ICD-10-CM | POA: Diagnosis not present

## 2024-11-28 DIAGNOSIS — J449 Chronic obstructive pulmonary disease, unspecified: Secondary | ICD-10-CM

## 2024-11-28 DIAGNOSIS — R0609 Other forms of dyspnea: Secondary | ICD-10-CM | POA: Diagnosis not present

## 2024-11-28 MED ORDER — TIOTROPIUM BROMIDE-OLODATEROL 2.5-2.5 MCG/ACT IN AERS: 2.0000 | INHALATION_SPRAY | Freq: Every day | RESPIRATORY_TRACT | 10 refills | Status: DC

## 2024-11-28 MED ORDER — ALBUTEROL SULFATE HFA 108 (90 BASE) MCG/ACT IN AERS: 1.0000 | INHALATION_SPRAY | Freq: Four times a day (QID) | RESPIRATORY_TRACT | 10 refills | Status: AC | PRN

## 2024-11-28 NOTE — Patient Instructions (Addendum)
 Dear Veronica Hansen;  I am glad you are feeling better. You have COPD and I will recommend the following: -Stiolto 2 puffs in the morning every day.  -Use the nebulizer as need for mucus and chest congestion. -Continue losing weight and walking every day.   I will see you in 4 months to check on you.

## 2024-12-13 ENCOUNTER — Telehealth: Payer: Self-pay | Admitting: Neurology

## 2024-12-13 NOTE — Telephone Encounter (Signed)
 Received stat sleep referral from [facility] Dr. Toribio Shan for OSA, CPAP is stating she needs a new motor. Placed in sleep referrals box

## 2024-12-20 ENCOUNTER — Other Ambulatory Visit: Payer: Self-pay

## 2024-12-20 MED ORDER — UMECLIDINIUM-VILANTEROL 62.5-25 MCG/ACT IN AEPB: 1.0000 | INHALATION_SPRAY | Freq: Every day | RESPIRATORY_TRACT | 6 refills | Status: AC

## 2024-12-20 NOTE — Telephone Encounter (Signed)
 Please advise.

## 2024-12-20 NOTE — Telephone Encounter (Signed)
 Changing inhaler to Anoro due to insurance coverage.

## 2025-04-03 ENCOUNTER — Ambulatory Visit
